# Patient Record
Sex: Male | Born: 1960 | Race: White | Hispanic: No | Smoking: Current every day smoker
Health system: Southern US, Community
[De-identification: ages and names within clinical notes are randomized; demographics above are authoritative.]

## PROBLEM LIST (undated history)

## (undated) DIAGNOSIS — E785 Hyperlipidemia, unspecified: Secondary | ICD-10-CM

## (undated) DIAGNOSIS — I4819 Other persistent atrial fibrillation: Secondary | ICD-10-CM

## (undated) DIAGNOSIS — F1011 Alcohol abuse, in remission: Secondary | ICD-10-CM

## (undated) DIAGNOSIS — I428 Other cardiomyopathies: Secondary | ICD-10-CM

## (undated) DIAGNOSIS — Z950 Presence of cardiac pacemaker: Secondary | ICD-10-CM

## (undated) DIAGNOSIS — I509 Heart failure, unspecified: Secondary | ICD-10-CM

## (undated) DIAGNOSIS — R0683 Snoring: Secondary | ICD-10-CM

## (undated) HISTORY — DX: Presence of cardiac pacemaker: Z95.0

## (undated) HISTORY — DX: Heart failure, unspecified: I50.9

## (undated) HISTORY — DX: Hyperlipidemia, unspecified: E78.5

## (undated) HISTORY — DX: Other cardiomyopathies: I42.8

## (undated) HISTORY — DX: Alcohol abuse, in remission: F10.11

## (undated) HISTORY — DX: Other persistent atrial fibrillation: I48.19

## (undated) HISTORY — DX: Snoring: R06.83

## (undated) HISTORY — PX: APPENDECTOMY: SHX54

---

## 1998-01-09 ENCOUNTER — Inpatient Hospital Stay (HOSPITAL_COMMUNITY): Admission: EM | Admit: 1998-01-09 | Discharge: 1998-01-11 | Payer: Self-pay | Admitting: Emergency Medicine

## 1998-02-19 ENCOUNTER — Inpatient Hospital Stay (HOSPITAL_COMMUNITY): Admission: EM | Admit: 1998-02-19 | Discharge: 1998-02-21 | Payer: Self-pay | Admitting: Emergency Medicine

## 1998-02-19 ENCOUNTER — Encounter: Payer: Self-pay | Admitting: Emergency Medicine

## 2000-08-13 ENCOUNTER — Emergency Department (HOSPITAL_COMMUNITY): Admission: EM | Admit: 2000-08-13 | Discharge: 2000-08-14 | Payer: Self-pay | Admitting: Emergency Medicine

## 2000-08-14 ENCOUNTER — Encounter: Payer: Self-pay | Admitting: Emergency Medicine

## 2000-08-19 ENCOUNTER — Ambulatory Visit (HOSPITAL_COMMUNITY): Admission: RE | Admit: 2000-08-19 | Discharge: 2000-08-19 | Payer: Self-pay | Admitting: Orthopedic Surgery

## 2000-08-19 ENCOUNTER — Encounter: Payer: Self-pay | Admitting: Orthopedic Surgery

## 2000-08-27 ENCOUNTER — Encounter: Payer: Self-pay | Admitting: Orthopedic Surgery

## 2000-08-27 ENCOUNTER — Encounter: Admission: RE | Admit: 2000-08-27 | Discharge: 2000-08-27 | Payer: Self-pay | Admitting: Orthopedic Surgery

## 2000-09-10 ENCOUNTER — Encounter: Admission: RE | Admit: 2000-09-10 | Discharge: 2000-09-10 | Payer: Self-pay | Admitting: Orthopedic Surgery

## 2000-09-10 ENCOUNTER — Encounter: Payer: Self-pay | Admitting: Orthopedic Surgery

## 2009-11-28 HISTORY — PX: CARDIAC CATHETERIZATION: SHX172

## 2009-12-07 ENCOUNTER — Inpatient Hospital Stay (HOSPITAL_COMMUNITY): Admission: EM | Admit: 2009-12-07 | Discharge: 2009-12-10 | Payer: Self-pay | Admitting: Emergency Medicine

## 2009-12-08 ENCOUNTER — Encounter (INDEPENDENT_AMBULATORY_CARE_PROVIDER_SITE_OTHER): Payer: Self-pay | Admitting: Cardiovascular Disease

## 2010-01-24 ENCOUNTER — Inpatient Hospital Stay (HOSPITAL_COMMUNITY): Admission: RE | Admit: 2010-01-24 | Discharge: 2010-01-26 | Payer: Self-pay | Admitting: Cardiovascular Disease

## 2010-03-01 ENCOUNTER — Ambulatory Visit (HOSPITAL_COMMUNITY): Admission: RE | Admit: 2010-03-01 | Discharge: 2010-03-01 | Payer: Self-pay | Admitting: Cardiovascular Disease

## 2010-03-18 ENCOUNTER — Encounter: Payer: Self-pay | Admitting: Internal Medicine

## 2010-04-18 DIAGNOSIS — I509 Heart failure, unspecified: Secondary | ICD-10-CM | POA: Insufficient documentation

## 2010-04-18 DIAGNOSIS — J449 Chronic obstructive pulmonary disease, unspecified: Secondary | ICD-10-CM | POA: Insufficient documentation

## 2010-04-18 DIAGNOSIS — I4891 Unspecified atrial fibrillation: Secondary | ICD-10-CM | POA: Insufficient documentation

## 2010-04-19 ENCOUNTER — Ambulatory Visit: Payer: Self-pay | Admitting: Internal Medicine

## 2010-04-19 ENCOUNTER — Encounter: Payer: Self-pay | Admitting: Internal Medicine

## 2010-04-19 DIAGNOSIS — R0609 Other forms of dyspnea: Secondary | ICD-10-CM | POA: Insufficient documentation

## 2010-04-19 DIAGNOSIS — R0989 Other specified symptoms and signs involving the circulatory and respiratory systems: Secondary | ICD-10-CM

## 2010-04-19 DIAGNOSIS — I426 Alcoholic cardiomyopathy: Secondary | ICD-10-CM | POA: Insufficient documentation

## 2010-04-30 DIAGNOSIS — Z950 Presence of cardiac pacemaker: Secondary | ICD-10-CM

## 2010-04-30 DIAGNOSIS — I4819 Other persistent atrial fibrillation: Secondary | ICD-10-CM

## 2010-04-30 HISTORY — PX: PACEMAKER INSERTION: SHX728

## 2010-04-30 HISTORY — DX: Presence of cardiac pacemaker: Z95.0

## 2010-04-30 HISTORY — DX: Other persistent atrial fibrillation: I48.19

## 2010-06-01 NOTE — Assessment & Plan Note (Signed)
Summary: cardiac eval/a-fib/ablation/mt   Visit Type:  Initial Consult Referring Provider:  Dr Royann Shivers Primary Provider:  [facility]   History of Present Illness: Mike Bean is a pleasant 50 yo WM with a h/o persistent atrial fibrillation, prior ETOH abuse, and nonischemic CM who presents today for EP consultation.  He was iniitally diagnosed with atrial fibrillation 2003 after presenting with presyncope to Urgent Care.  He was taken to [facility] and evaluated by Dr Donnie Aho.  He spontaneously converted to sinus rhythm but returned to afib 4 weeks later.  He was placed on digoxin and coumadin.  He reports maintaining sinus rhythm for several years and then quit following up with Dr Donnie Aho and Patty Sermons.   He reports subsequent afib 1-2 times per year lasting less than 24 hours over the past few weeks.  Since that time, he reports increasing frequency and duraiton of atrial fibrillation.  He reports associated fatigue and decreased exercise tolerance.  He subsequently developed sympatomatic CHF for which he was hospitalized at [facility].  He was found to have EF 20-25% by echo.  Cath revealed normal coronary arteries.   He was placed on pradaxa and cardioverted unsuccessfully 01/25/10.  He was loaded overnight with amiodarone and again unsuccesfully cardioverted the following day.  He was continued on amiodarone for several weeks and returned for cardioversion 03/01/10.  Though cardioversion was initially successful, unfortunately, he had returned to afib by follow-up the next week. Presently, he reports decreased exercise tolerance and fatigue.  He has SOB and stable volume overload.   Previously, he was very healthy.  In July of this year he road his bike 500 miles.  He has subsequently become inactive.  Current Medications (verified): 1)  Amiodarone Hcl 200 Mg Tabs (Amiodarone Hcl) .... Take 2 Tablets By Mouth Twice A Day 2)  Digoxin 0.125 Mg Tabs (Digoxin) .... Take One Tablet  By Mouth Daily 3)  Metoprolol Tartrate 25 Mg Tabs (Metoprolol Tartrate) .... Take 1 1/2  Tablet By Mouth Twice A Day 4)  Furosemide 40 Mg Tabs (Furosemide) .... Take 2 Tablets By Mouth Daily. 5)  Aspirin 81 Mg Tbec (Aspirin) .... Take One Tablet By Mouth Daily 6)  Lisinopril 2.5 Mg Tabs (Lisinopril) .... Take One Tablet By Mouth Daily 7)  Potassium Chloride Crys Cr 20 Meq Cr-Tabs (Potassium Chloride Crys Cr) .... Take One Tablet By Mouth Daily 8)  Pradaxa 150 Mg Caps (Dabigatran Etexilate Mesylate) .... Two Times A Day 9)  Multivitamins   Tabs (Multiple Vitamin) .... Once Daily 10)  Thiamine Hcl 100 Mg Tabs (Thiamine Hcl) .... Once Daily 11)  Folic Acid 1 Mg Tabs (Folic Acid) .... 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  Remarkable in that his father died at 9 of an MI.  He   has 2 brothers but apparently that do not have coronary disease.      Social History:  He is separated and lives in [place].  He works in Airline pilot.  He smokes a pack a day and drinks at least 3 beers a day.  He has 2 children.  No  grandchildren.   Review of Systems       snores  and suspects that he has sleep apnea  Vital Signs:  Patient profile:   50 year old male Height:      70 inches Weight:      217 pounds BMI:     31.25 Pulse rate:   143 / minute BP sitting:   92 / 72  (left arm)  Vitals Entered By: Laurance Flatten CMA (April 19, 2010 9:03 AM)  Physical Exam  General:  Well developed, well nourished, in no acute distress. Head:  normocephalic and atraumatic Eyes:  PERRLA/EOM intact; conjunctiva and lids normal. Mouth:  Teeth, gums and palate  normal. Oral mucosa normal. Neck:  supple, JVP 9cm Lungs:  Clear bilaterally to auscultation and percussion. Heart:  irregular tachycardic rhythm, no m/r/g Abdomen:  Bowel sounds positive; abdomen soft and non-tender without masses, organomegaly, or hernias noted. No hepatosplenomegaly. Msk:  Back normal, normal gait. Muscle strength and tone normal. Extremities:  1+ BLE edema Neurologic:  Alert and oriented x 3. Skin:  Intact without lesions or rashes. Cervical Nodes:  no significant adenopathy Psych:  Normal affect.   Echocardiogram  Procedure date:  12/08/2009  Findings:       Study Conclusions    - Left ventricle: The cavity size was mildly dilated. Systolic     function was severely reduced. The estimated ejection fraction was     in the range of 20% to 25%. Diffuse hypokinesis.   - Mitral valve: Mild regurgitation.   - Left atrium: The atrium was mildly dilated.   Transthoracic echocardiography. M-mode, complete 2D, spectral   Doppler, and color Doppler. Height: Height: 177cm. Height: 69.7in.   Weight: Weight: 98kg. Weight: 215.6lb. Body mass index: BMI:   31.3kg/m^2. Body surface area: BSA: 2.25m^2. Blood pressure: 97/60.   Patient status: Inpatient. Location: ICU/CCU  Left atrium   AP dim           50 mm     ------   AP dim index   2.33 cm/m^2 <2.2    --------------------------------------------------------------------   Prepared and Electronically Authenticated by    Ritta Slot, MD   [id number]   Cardiac Cath  Procedure date:  12/10/2009  Findings:      IMPRESSION:   1. Angiographically normal coronary arteries.   2. Anomalous left circumflex artery.      PLAN:  I discussed the findings with Mike. Mike Bean and Dr. Royann Shivers who were   in agreement that at this point we should approach medical management of   his new-onset heart failure.  This may be related to alcohol use or his   recent atrial fibrillation and improved rate control as well as  alcohol   reduction and medical therapy may help reverse or improve his   cardiomyopathy.               Italy Hilty, MD      Echocardiogram  Procedure date:  04/06/2010  Findings:      Interpretation:  A complete two-dimensional transthoracic echo was performed. AF with controlled VR. 4 chamber enlargement. There is global thinning  of the left ventricular walls. The left ventricle  is moderately dilated. Left ventricular  systolic function  is severely reduced. EF = <25%. There is severe global hypokinesis of the left ventricle. The right  ventricle is mildly dilated. The LA is moderate dilated The right atrium  is mild to moderatly dilated. There is mild to moderate  mitial regugitation. Right ventricular systolic pressure is normal  There is no pericardial effusion.  Susa Griffins, MD      EKG  Procedure date:  04/19/2010  Findings:      afib, V rate 140 bpm, nonspecific ST/T changes  Impression & Recommendations:  Problem # 1:  ATRIAL FIBRILLATION (ICD-427.31) Mike Bean presents today for further evaluation of symptomatic persistent atrial fibrillation.  His afib is refractory to amiodarone therapy.  Recent echo reveals LA size 50mm.  He also has very poor rate control with tachycardia mediated cardiomypathy.  The underlying cause for his afib is likely heavy ETOH and possibly sleep apnea.  His noncompliance with ETOH cessation and medical therapy does not bode well for his longterm prognosis.  Today, he reports that he has not been taking pradaxa or metoprolol as prescribed by Dr Royann Shivers.  I am concerned that his afib is now quite advanced and may not be amenable to catheter ablation.  I anticipate success rates for ablation would be 50-60% and would likely require more than one procedure. Therapeutic strategies for afib including medicine and ablation were discussed in detail with the patient today. Risk, benefits, and alternatives to EP study and  radiofrequency ablation for afib were also discussed in detail today. These risks include but are not limited to stroke, bleeding, vascular damage, tamponade, perforation, damage to the esophagus, lungs, and other structures, pulmonary vein stenosis, worsening renal function, and death. The patient understands these risk and wishes to further contemplate his options. The importance of compliance with rate control medicines was stressed with the patient today.  He will contact my office if he chooses to proceed with ablation.  Problem # 2:  CHF (ICD-428.0) The patient has a tachycardia mediated cardiomyopathy, likely compounded by heavy ETOH use. He appears euvolemic on exam today.  The importance of rate control was stressed as above.  He is instructed to restart metoprolol as ordered by Dr Royann Shivers.  Complete ETOH avoidance is also advised.  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  This can be performed by Dr Tresa Endo at [facility].  Patient Instructions: 1)  Your physician recommends that you schedule a follow-up appointment in: 3 months with Dr Johney Frame 2)  Your physician recommends that you continue on your current medications as directed. Please refer to the Current Medication list given to you today.

## 2010-06-01 NOTE — Letter (Signed)
Summary: [facility]  [facility]   Imported By: Marylou Mccoy 05/11/2010 09:30:38  _____________________________________________________________________  External Attachment:    Type:   Image     Comment:   External Document

## 2010-06-21 ENCOUNTER — Inpatient Hospital Stay (HOSPITAL_COMMUNITY)
Admission: AD | Admit: 2010-06-21 | Discharge: 2010-07-01 | DRG: 242 | Disposition: A | Discharge status: Home / Self Care | Payer: 59 | Source: Ambulatory Visit | Attending: Cardiovascular Disease | Admitting: Cardiovascular Disease

## 2010-06-21 ENCOUNTER — Inpatient Hospital Stay (HOSPITAL_COMMUNITY): Payer: 59

## 2010-06-21 DIAGNOSIS — I4891 Unspecified atrial fibrillation: Secondary | ICD-10-CM | POA: Diagnosis present

## 2010-06-21 DIAGNOSIS — K7200 Acute and subacute hepatic failure without coma: Secondary | ICD-10-CM | POA: Diagnosis not present

## 2010-06-21 DIAGNOSIS — T462X5A Adverse effect of other antidysrhythmic drugs, initial encounter: Secondary | ICD-10-CM | POA: Diagnosis present

## 2010-06-21 DIAGNOSIS — D696 Thrombocytopenia, unspecified: Secondary | ICD-10-CM | POA: Diagnosis not present

## 2010-06-21 DIAGNOSIS — I428 Other cardiomyopathies: Secondary | ICD-10-CM | POA: Diagnosis present

## 2010-06-21 DIAGNOSIS — R7989 Other specified abnormal findings of blood chemistry: Secondary | ICD-10-CM

## 2010-06-21 DIAGNOSIS — Z7982 Long term (current) use of aspirin: Secondary | ICD-10-CM

## 2010-06-21 DIAGNOSIS — I5022 Chronic systolic (congestive) heart failure: Secondary | ICD-10-CM | POA: Diagnosis present

## 2010-06-21 DIAGNOSIS — Z9119 Patient's noncompliance with other medical treatment and regimen: Secondary | ICD-10-CM

## 2010-06-21 DIAGNOSIS — Z91199 Patient's noncompliance with other medical treatment and regimen due to unspecified reason: Secondary | ICD-10-CM

## 2010-06-21 DIAGNOSIS — I509 Heart failure, unspecified: Principal | ICD-10-CM | POA: Diagnosis present

## 2010-06-21 DIAGNOSIS — I517 Cardiomegaly: Secondary | ICD-10-CM | POA: Diagnosis present

## 2010-06-21 DIAGNOSIS — E876 Hypokalemia: Secondary | ICD-10-CM | POA: Diagnosis not present

## 2010-06-21 DIAGNOSIS — N179 Acute kidney failure, unspecified: Secondary | ICD-10-CM | POA: Diagnosis not present

## 2010-06-21 LAB — DIFFERENTIAL
Lymphocytes Relative: 21 % (ref 12–46)
Monocytes Absolute: 1.5 10*3/uL — ABNORMAL HIGH (ref 0.1–1.0)
Neutro Abs: 8 10*3/uL — ABNORMAL HIGH (ref 1.7–7.7)
Neutrophils Relative %: 66 % (ref 43–77)

## 2010-06-21 LAB — PROTIME-INR
INR: 1.83 — ABNORMAL HIGH (ref 0.00–1.49)
Prothrombin Time: 21.3 seconds — ABNORMAL HIGH (ref 11.6–15.2)

## 2010-06-21 LAB — DIGOXIN LEVEL: Digoxin Level: 0.2 ng/mL — ABNORMAL LOW (ref 0.8–2.0)

## 2010-06-21 LAB — CBC
HCT: 39.7 % (ref 39.0–52.0)
MCH: 30.9 pg (ref 26.0–34.0)
Platelets: 236 10*3/uL (ref 150–400)
RDW: 12.6 % (ref 11.5–15.5)

## 2010-06-21 LAB — COMPREHENSIVE METABOLIC PANEL
ALT: 53 U/L (ref 0–53)
AST: 54 U/L — ABNORMAL HIGH (ref 0–37)
Alkaline Phosphatase: 58 U/L (ref 39–117)
BUN: 17 mg/dL (ref 6–23)
Creatinine, Ser: 1.17 mg/dL (ref 0.4–1.5)
GFR calc Af Amer: >60 mL/min (ref >60)
Potassium: 3.5 mEq/L (ref 3.5–5.1)
Sodium: 131 mEq/L — ABNORMAL LOW (ref 135–145)
Total Bilirubin: 2.6 mg/dL — ABNORMAL HIGH (ref 0.3–1.2)
Total Protein: 5.5 g/dL — ABNORMAL LOW (ref 6.0–8.3)

## 2010-06-21 LAB — CARDIAC PANEL(CRET KIN+CKTOT+MB+TROPI): CK, MB: 3.3 ng/mL (ref 0.3–4.0)

## 2010-06-21 LAB — APTT: aPTT: 42 seconds — ABNORMAL HIGH (ref 24–37)

## 2010-06-22 ENCOUNTER — Inpatient Hospital Stay (HOSPITAL_COMMUNITY): Payer: 59

## 2010-06-22 LAB — COMPREHENSIVE METABOLIC PANEL
ALT: 3621 U/L — ABNORMAL HIGH (ref 0–53)
Albumin: 2.6 g/dL — ABNORMAL LOW (ref 3.5–5.2)
Albumin: 2.3 g/dL — ABNORMAL LOW (ref 3.5–5.2)
BUN: 27 mg/dL — ABNORMAL HIGH (ref 6–23)
CO2: 11 mEq/L — ABNORMAL LOW (ref 19–32)
Calcium: 8 mg/dL — ABNORMAL LOW (ref 8.4–10.5)
Chloride: 93 mEq/L — ABNORMAL LOW (ref 96–112)
Creatinine, Ser: 2.52 mg/dL — ABNORMAL HIGH (ref 0.4–1.5)
GFR calc Af Amer: 32 mL/min — ABNORMAL LOW (ref >60)
GFR calc non Af Amer: 26 mL/min — ABNORMAL LOW (ref >60)
Glucose, Bld: 95 mg/dL (ref 70–99)
Glucose, Bld: 165 mg/dL — ABNORMAL HIGH (ref 70–99)
Total Bilirubin: 4.6 mg/dL — ABNORMAL HIGH (ref 0.3–1.2)

## 2010-06-22 LAB — CBC
HCT: 48.3 % (ref 39.0–52.0)
HCT: 44.6 % (ref 39.0–52.0)
Hemoglobin: 16 g/dL (ref 13.0–17.0)
Hemoglobin: 14.7 g/dL (ref 13.0–17.0)
MCH: 31.1 pg (ref 26.0–34.0)
MCH: 30.5 pg (ref 26.0–34.0)
MCHC: 33.1 g/dL (ref 30.0–36.0)
MCHC: 33 g/dL (ref 30.0–36.0)
MCV: 94 fL (ref 78.0–100.0)
MCV: 92.5 fL (ref 78.0–100.0)
Platelets: 288 10*3/uL (ref 150–400)
Platelets: 271 10*3/uL (ref 150–400)
RBC: 5.14 MIL/uL (ref 4.22–5.81)
RBC: 4.82 MIL/uL (ref 4.22–5.81)
RDW: 12.9 % (ref 11.5–15.5)
WBC: 23.7 10*3/uL — ABNORMAL HIGH (ref 4.0–10.5)

## 2010-06-22 LAB — BASIC METABOLIC PANEL
Creatinine, Ser: 2.65 mg/dL — ABNORMAL HIGH (ref 0.4–1.5)
GFR calc non Af Amer: 25 mL/min — ABNORMAL LOW (ref >60)
Glucose, Bld: 59 mg/dL — ABNORMAL LOW (ref 70–99)
Potassium: 5.3 mEq/L — ABNORMAL HIGH (ref 3.5–5.1)

## 2010-06-22 LAB — DIFFERENTIAL
Basophils Relative: 0 % (ref 0–1)
Monocytes Relative: 7 % (ref 3–12)

## 2010-06-22 LAB — LIPASE, BLOOD: Lipase: 93 U/L — ABNORMAL HIGH (ref 11–59)

## 2010-06-23 ENCOUNTER — Inpatient Hospital Stay (HOSPITAL_COMMUNITY): Payer: 59

## 2010-06-23 LAB — CBC
HCT: 39.5 % (ref 39.0–52.0)
Hemoglobin: 13.5 g/dL (ref 13.0–17.0)
MCHC: 34.2 g/dL (ref 30.0–36.0)
Platelets: 190 10*3/uL (ref 150–400)
RBC: 4.4 MIL/uL (ref 4.22–5.81)
RDW: 12.6 % (ref 11.5–15.5)
WBC: 23.8 10*3/uL — ABNORMAL HIGH (ref 4.0–10.5)

## 2010-06-23 LAB — COMPREHENSIVE METABOLIC PANEL
AST: 15024 U/L — ABNORMAL HIGH (ref 0–37)
Albumin: 2.3 g/dL — ABNORMAL LOW (ref 3.5–5.2)
Calcium: 7.8 mg/dL — ABNORMAL LOW (ref 8.4–10.5)
Creatinine, Ser: 1.99 mg/dL — ABNORMAL HIGH (ref 0.4–1.5)
GFR calc Af Amer: 43 mL/min — ABNORMAL LOW (ref >60)
GFR calc non Af Amer: 35 mL/min — ABNORMAL LOW (ref >60)

## 2010-06-23 LAB — PROTIME-INR: INR: 7.65 (ref 0.00–1.49)

## 2010-06-23 LAB — HEPARIN LEVEL (UNFRACTIONATED): Heparin Unfractionated: <0.1 IU/mL — ABNORMAL LOW (ref 0.30–0.70)

## 2010-06-24 ENCOUNTER — Inpatient Hospital Stay (HOSPITAL_COMMUNITY): Payer: 59

## 2010-06-24 LAB — COMPREHENSIVE METABOLIC PANEL
ALT: 4804 U/L — ABNORMAL HIGH (ref 0–53)
Alkaline Phosphatase: 148 U/L — ABNORMAL HIGH (ref 39–117)
BUN: 28 mg/dL — ABNORMAL HIGH (ref 6–23)
CO2: 28 mEq/L (ref 19–32)
Chloride: 101 mEq/L (ref 96–112)
GFR calc non Af Amer: 57 mL/min — ABNORMAL LOW (ref >60)
Glucose, Bld: 72 mg/dL (ref 70–99)
Potassium: 4 mEq/L (ref 3.5–5.1)
Sodium: 136 mEq/L (ref 135–145)
Total Bilirubin: 3.2 mg/dL — ABNORMAL HIGH (ref 0.3–1.2)
Total Protein: 4.6 g/dL — ABNORMAL LOW (ref 6.0–8.3)

## 2010-06-24 LAB — EXPECTORATED SPUTUM ASSESSMENT W GRAM STAIN, RFLX TO RESP C

## 2010-06-24 LAB — BRAIN NATRIURETIC PEPTIDE: Pro B Natriuretic peptide (BNP): 780 pg/mL — ABNORMAL HIGH (ref 0.0–100.0)

## 2010-06-24 LAB — RAPID URINE DRUG SCREEN, HOSP PERFORMED
Benzodiazepines: NOT DETECTED
Cocaine: NOT DETECTED
Opiates: NOT DETECTED

## 2010-06-24 LAB — PROTIME-INR: Prothrombin Time: 35.4 seconds — ABNORMAL HIGH (ref 11.6–15.2)

## 2010-06-25 LAB — COMPREHENSIVE METABOLIC PANEL
AST: 1168 U/L — ABNORMAL HIGH (ref 0–37)
Albumin: 2.1 g/dL — ABNORMAL LOW (ref 3.5–5.2)
Alkaline Phosphatase: 160 U/L — ABNORMAL HIGH (ref 39–117)
BUN: 22 mg/dL (ref 6–23)
Chloride: 99 mEq/L (ref 96–112)
Creatinine, Ser: 1.07 mg/dL (ref 0.4–1.5)
GFR calc Af Amer: >60 mL/min (ref >60)
Potassium: 4.1 mEq/L (ref 3.5–5.1)
Total Bilirubin: 3.4 mg/dL — ABNORMAL HIGH (ref 0.3–1.2)
Total Protein: 4.6 g/dL — ABNORMAL LOW (ref 6.0–8.3)

## 2010-06-25 LAB — HEPATITIS PANEL, ACUTE
HCV Ab: NEGATIVE
Hep A IgM: NEGATIVE

## 2010-06-25 LAB — CBC
HCT: 43.1 % (ref 39.0–52.0)
Hemoglobin: 14.5 g/dL (ref 13.0–17.0)
MCH: 30.1 pg (ref 26.0–34.0)
MCHC: 33.6 g/dL (ref 30.0–36.0)
MCV: 89.6 fL (ref 78.0–100.0)
RDW: 12.7 % (ref 11.5–15.5)

## 2010-06-25 LAB — PROTIME-INR: INR: 2.36 — ABNORMAL HIGH (ref 0.00–1.49)

## 2010-06-26 ENCOUNTER — Inpatient Hospital Stay (HOSPITAL_COMMUNITY): Payer: 59

## 2010-06-26 DIAGNOSIS — I428 Other cardiomyopathies: Secondary | ICD-10-CM

## 2010-06-26 DIAGNOSIS — I4891 Unspecified atrial fibrillation: Secondary | ICD-10-CM

## 2010-06-26 LAB — COMPREHENSIVE METABOLIC PANEL
ALT: 1882 U/L — ABNORMAL HIGH (ref 0–53)
AST: 399 U/L — ABNORMAL HIGH (ref 0–37)
Alkaline Phosphatase: 137 U/L — ABNORMAL HIGH (ref 39–117)
CO2: 36 mEq/L — ABNORMAL HIGH (ref 19–32)
Chloride: 96 mEq/L (ref 96–112)
GFR calc Af Amer: >60 mL/min (ref >60)
GFR calc non Af Amer: >60 mL/min (ref >60)
Glucose, Bld: 72 mg/dL (ref 70–99)
Sodium: 139 mEq/L (ref 135–145)
Total Bilirubin: 3.7 mg/dL — ABNORMAL HIGH (ref 0.3–1.2)

## 2010-06-26 LAB — BRAIN NATRIURETIC PEPTIDE: Pro B Natriuretic peptide (BNP): 730 pg/mL — ABNORMAL HIGH (ref 0.0–100.0)

## 2010-06-26 LAB — CBC
HCT: 46.4 % (ref 39.0–52.0)
MCH: 31 pg (ref 26.0–34.0)
MCV: 91.2 fL (ref 78.0–100.0)
Platelets: 161 10*3/uL (ref 150–400)
RBC: 5.09 MIL/uL (ref 4.22–5.81)
WBC: 17.5 10*3/uL — ABNORMAL HIGH (ref 4.0–10.5)

## 2010-06-26 LAB — GLUCOSE, CAPILLARY: Glucose-Capillary: 147 mg/dL — ABNORMAL HIGH (ref 70–99)

## 2010-06-27 ENCOUNTER — Inpatient Hospital Stay (HOSPITAL_COMMUNITY): Payer: 59

## 2010-06-27 DIAGNOSIS — I509 Heart failure, unspecified: Secondary | ICD-10-CM

## 2010-06-27 LAB — COMPREHENSIVE METABOLIC PANEL
AST: 215 U/L — ABNORMAL HIGH (ref 0–37)
Albumin: 2.2 g/dL — ABNORMAL LOW (ref 3.5–5.2)
Alkaline Phosphatase: 113 U/L (ref 39–117)
BUN: 15 mg/dL (ref 6–23)
CO2: 35 mEq/L — ABNORMAL HIGH (ref 19–32)
Chloride: 97 mEq/L (ref 96–112)
Creatinine, Ser: 1.2 mg/dL (ref 0.4–1.5)
GFR calc Af Amer: >60 mL/min (ref >60)
GFR calc non Af Amer: >60 mL/min (ref >60)
Potassium: 4.5 mEq/L (ref 3.5–5.1)
Total Bilirubin: 2.7 mg/dL — ABNORMAL HIGH (ref 0.3–1.2)

## 2010-06-27 LAB — CBC
HCT: 47.5 % (ref 39.0–52.0)
Hemoglobin: 16.1 g/dL (ref 13.0–17.0)
MCH: 30.7 pg (ref 26.0–34.0)
MCHC: 33.9 g/dL (ref 30.0–36.0)
RBC: 5.24 MIL/uL (ref 4.22–5.81)

## 2010-06-27 LAB — DIFFERENTIAL
Basophils Relative: 2 % — ABNORMAL HIGH (ref 0–1)
Eosinophils Relative: 4 % (ref 0–5)
Lymphs Abs: 3.3 10*3/uL (ref 0.7–4.0)
Monocytes Absolute: 2.3 10*3/uL — ABNORMAL HIGH (ref 0.1–1.0)
Monocytes Relative: 15 % — ABNORMAL HIGH (ref 3–12)

## 2010-06-28 LAB — COMPREHENSIVE METABOLIC PANEL
AST: 140 U/L — ABNORMAL HIGH (ref 0–37)
BUN: 17 mg/dL (ref 6–23)
CO2: 32 mEq/L (ref 19–32)
Calcium: 8 mg/dL — ABNORMAL LOW (ref 8.4–10.5)
Chloride: 97 mEq/L (ref 96–112)
Creatinine, Ser: 1.05 mg/dL (ref 0.4–1.5)
GFR calc Af Amer: >60 mL/min (ref >60)
GFR calc non Af Amer: >60 mL/min (ref >60)
Glucose, Bld: 126 mg/dL — ABNORMAL HIGH (ref 70–99)
Total Bilirubin: 1.9 mg/dL — ABNORMAL HIGH (ref 0.3–1.2)

## 2010-06-28 LAB — CULTURE, BLOOD (ROUTINE X 2): Culture  Setup Time: 201202231533

## 2010-06-28 LAB — BRAIN NATRIURETIC PEPTIDE: Pro B Natriuretic peptide (BNP): 556 pg/mL — ABNORMAL HIGH (ref 0.0–100.0)

## 2010-06-29 LAB — COMPREHENSIVE METABOLIC PANEL
AST: 120 U/L — ABNORMAL HIGH (ref 0–37)
Albumin: 2.4 g/dL — ABNORMAL LOW (ref 3.5–5.2)
BUN: 15 mg/dL (ref 6–23)
Calcium: 8.1 mg/dL — ABNORMAL LOW (ref 8.4–10.5)
Chloride: 97 mEq/L (ref 96–112)
Creatinine, Ser: 1.17 mg/dL (ref 0.4–1.5)
GFR calc Af Amer: >60 mL/min (ref >60)
Total Bilirubin: 1.6 mg/dL — ABNORMAL HIGH (ref 0.3–1.2)
Total Protein: 5.5 g/dL — ABNORMAL LOW (ref 6.0–8.3)

## 2010-06-29 LAB — PROTIME-INR
INR: 1.15 (ref 0.00–1.49)
Prothrombin Time: 14.9 seconds (ref 11.6–15.2)

## 2010-06-30 LAB — COMPREHENSIVE METABOLIC PANEL
ALT: 506 U/L — ABNORMAL HIGH (ref 0–53)
AST: 91 U/L — ABNORMAL HIGH (ref 0–37)
Alkaline Phosphatase: 138 U/L — ABNORMAL HIGH (ref 39–117)
CO2: 31 mEq/L (ref 19–32)
Calcium: 8.3 mg/dL — ABNORMAL LOW (ref 8.4–10.5)
GFR calc Af Amer: >60 mL/min (ref >60)
Potassium: 4.6 mEq/L (ref 3.5–5.1)
Sodium: 137 mEq/L (ref 135–145)
Total Protein: 6 g/dL (ref 6.0–8.3)

## 2010-06-30 LAB — DIFFERENTIAL
Basophils Relative: 0 % (ref 0–1)
Lymphocytes Relative: 34 % (ref 12–46)
Monocytes Absolute: 1.4 10*3/uL — ABNORMAL HIGH (ref 0.1–1.0)
Monocytes Relative: 12 % (ref 3–12)
Neutro Abs: 5.7 10*3/uL (ref 1.7–7.7)
Neutrophils Relative %: 50 % (ref 43–77)

## 2010-06-30 LAB — CBC
HCT: 50.7 % (ref 39.0–52.0)
Hemoglobin: 17 g/dL (ref 13.0–17.0)
MCH: 30.7 pg (ref 26.0–34.0)
RBC: 5.54 MIL/uL (ref 4.22–5.81)

## 2010-06-30 LAB — PROTIME-INR
INR: 1.13 (ref 0.00–1.49)
Prothrombin Time: 14.7 seconds (ref 11.6–15.2)

## 2010-06-30 NOTE — H&P (Signed)
NAMEALEGANDRO, MACNAUGHTON                  ACCOUNT NO.:  [id number]  MEDICAL RECORD NO.:  [id number]          PATIENT TYPE:  EMS  LOCATION:  MAJO                         FACILITY:  [facility]  PHYSICIAN:  Thurmon Fair, MD     DATE OF BIRTH:  09/07/1960  DATE OF ADMISSION:  12/07/2009 DATE OF DISCHARGE:                             HISTORY & PHYSICAL   CHIEF COMPLAINT:  Shortness of breath.  HISTORY OF PRESENT ILLNESS:  [name] is a 50 year old male who has had a past history of atrial fibrillation.  In around 2003, he had a couple spells of atrial fibrillation, each one lasted for a couple days.  He was not cardioverted.  He was treated with Lanoxin because he had a relatively low blood pressure at that time.  He was treated by Dr. Patty Sermons then.  He says he had an extensive exercise test as an outpatient around that time that was negative.  About 3 years ago he stopped seeing Dr. Patty Sermons and his medications ran out.  He had been on no medicines since.  For the last 1-2 weeks, he has been having increasing lower extremity edema and orthopnea.  He went to Dr. Merita Norton today and was found to be in rapid atrial fibrillation.  He admits he was recently at [facility] "partying."  He has had some chest pressure but it is nonexertional and it is constant.  He was sent over from Urgent Care today to [facility] ER.  On arrival to the emergency room he is in atrial fibrillation with a rate of about 150.  PAST MEDICAL HISTORY:  Remarkable for dyslipidemia.  He has been on medication for this in the past.  He had a remote appendectomy.  He denies any history of diabetes or hypertension.  He had no other major surgeries.  He is currently on no medications.  ALLERGIES:  He has no known drug allergies.  SOCIAL HISTORY:  He is separated.  He works in Airline pilot.  He smokes a pack a day and drinks at least 3 beers a day.  He has 2 children.  No grandchildren.  FAMILY HISTORY:  Remarkable in that his  father died at 19 of an MI.  He has 2 brothers but apparently that do not have coronary disease.  REVIEW OF SYSTEMS:  Essentially unremarkable except for as noted above. He denies any GI bleeding or melena.  He has not had any renal problems. He says over the past 3 years he has had intermittent episodes of palpitations and tachycardia, he self- treats this by jumping into a cold shower.  PHYSICAL EXAMINATION:  Blood pressure 126/76, heart rate 154, respirations 16. GENERAL:  He is a well-developed, well-nourished male in no acute distress. HEENT: Normocephalic, atraumatic.  Extraocular muscles are intact. Sclerae anicteric.  Lids and conjunctivae within normal limits.  NECK: Without bruit.  Thyroid is not enlarged. CHEST:  Reveals expiratory wheezing bilaterally. CARDIAC:  Reveals irregularly irregular rhythm with rapid ventricular response.  No obvious murmur. ABDOMEN:  Nontender, nondistended. EXTREMITIES:  Reveal 1+ pretibial edema bilaterally with intact distal pulses. NEURO:  Exam is  grossly intact.  He is awake, alert, oriented and cooperative.  He moves all extremities without obvious deficit.  SKIN: Cool and dry.  Labs are pending.  EKG shows atrial fibrillation with narrow complex, no obvious ischemic changes.  IMPRESSION: 1. Rapid atrial fibrillation. 2. Congestive heart failure secondary to number 1 with a history of     orthopnea and lower extremity edema. 3. Chronic obstructive pulmonary disease by history. 4. Past history of paroxysmal atrial fibrillation with noncompliance     the last 3 years with medications. 5. Past history of dyslipidemia, noncompliant. 6. Suspected sleep apnea, this is per the patient's history, he has     been instructed to get a sleep study but never has followed through     with this.  PLAN:  The patient will be admitted to telemetry.  He will be started on diltiazem for rate control.  For now we will for beta-blocker with his active  wheezing.  He will be diuresed.  We will obtain initial labs and further evaluation will be determined in the next 24 hours.  He may need diagnostic catheterization before we consider coumadinizing him.     Abelino Derrick, P.A.   ______________________________ Thurmon Fair, MD    LKK/MEDQ  D:  12/07/2009  T:  12/07/2009  Job:  [id number]  cc:   Tamera Punt, PA  Electronically Signed by Corine Shelter P.A. on 06/23/2010 09:08:01 AM Electronically Signed by Thurmon Fair M.D. on 06/30/2010 12:49:40 PM

## 2010-06-30 NOTE — H&P (Signed)
NAMELEW, PROUT NO.:  [id number]  MEDICAL RECORD NO.:  [id number]           PATIENT TYPE:  I  LOCATION:  2907                         FACILITY:  [facility]  PHYSICIAN:  Thurmon Fair, MD     DATE OF BIRTH:  [birth year]  DATE OF ADMISSION:  06/21/2010 DATE OF DISCHARGE:                             HISTORY & PHYSICAL   Mike Bean is being admitted to [facility] for acute decompensation of congestive heart failure and atrial fibrillation with rapid ventricular response.  Mike Bean is a 50 year old man with severe nonischemic dilated cardiomyopathy and atrial fibrillation that has been refractory to medical therapy.  His most recent left ventricle ejection fraction was estimated to be less than 25% by echocardiography performed on December 8.  There was evidence of global left ventricular hypokinesis.  He has angiographically normal coronary arteries by cardiac catheterization performed in August 2011.  He does have anomalous left circumflex coronary artery without any obstruction.  He has showed spotty compliance with medical therapy over the last several months.  An attempt at cardioversion was last performed in November and although he returned to a sinus rhythm after 2 shocks, he immediately reverted to atrial fibrillation.  He has been evaluated by Dr. Johney Frame for possible radiofrequency ablation of his atrial fibrillation but decision has not yet been made to go ahead with this procedure.  Mike Bean used to be a fairly heavy alcohol user and smoke cigarettes but he has actually been completely abstinent from cigarettes and alcohol since April 30, 2010.  He has actually been feeling extremely well until about 2 weeks ago.  In fact he was feeling so well that he stopped many of his medications including the amiodarone, metoprolol, lisinopril, although he has continued taking Pradaxa, furosemide and digoxin.  About 2 weeks ago he began feeling unwell,  was coughing copious amounts of clear or slightly yellowish sputum.  A few days ago he was evaluated in an urgent care center at [facility] and was diagnosed with "bronchitis."  He was prescribed azithromycin.  He took the first day loading dose and a second dose and then began feeling extremely rapid palpitations and generally feeling unwell.  He presents today to the office with clinical findings of severely decompensated heart failure and severe tachycardia.  He is quite hypotensive with a blood pressure of 80/66 and he is slightly dizzy although he has not had any syncope.  He has rapid palpitations, is very short of breath at rest and is coughing sometimes with hemoptysis.  He has not had any problems with edema and has not been any significant increase in his weight.  In fact he weighs 4 pounds less than at his previous appointment.  He denies focal neurological deficits or intermittent claudication or frank bleeding problems.  He has alternating diarrhea and constipation and persistent nausea and vomiting.  He does not have frank abdominal pain.  He describes his hands that as being unusually red.  CURRENT MEDICATIONS: 1. Furosemide 80 mg every morning. 2. Pradaxa 150 mg which he is taking only once daily instead of twice  daily. 3. Aspirin 81 mg daily. 4. Digoxin 0.125 mg daily. 5. Occasional multivitamin.  ALLERGIES:  He has no known drug allergies.  He had some nausea and vomiting of bilious material about a month ago which he blamed on amiodarone and subsequently stopped the medication.  SOCIAL HISTORY:  Mike Bean is married but separated from his wife.  He is in good relationship with her and actually receives his medical insurance through her.  He does not smoke or drink alcohol anymore since January 1.  He denies using any recreational drugs in the recent past.  He is a businessman.  He used to exercise avidly and ride his bike for tenths of miles every weekend  but he has not been unable to exercise recently.  PAST MEDICAL HISTORY:  Significant for heart failure, atrial fibrillation and a remote appendectomy.  FAMILY HISTORY:  Significant for a heart attack in his father at age 31 and in his grandfather at age 74.  He has healthy kids, ages 96 and 31.  PHYSICAL EXAMINATION:  GENERAL:  He looks ashen and is very lethargic although he is fully alert and oriented. VITAL SIGNS:  His blood pressure is 80/66, heart rate is 167 and irregular, respiratory rate is 25-30, he weighs 205 pounds and is 5 feet 10 inches tall. NECK:  Jugular venous pulsations are elevated approximately 4-5 cm above the sternal angle.  There is prompt hepatojugular reflux.  I did not see goiter or lymphadenopathy.  There is no carotid bruit and the carotid pulses are low volume but brisk. LUNGS:  Clear to auscultation bilaterally without any audible wheezes, rales, dullness to percussion, abnormal fremitus, egophony or other signs of consolidation. CARDIOVASCULAR:  Weak, effaced and laterally displaced apical impulse. The rhythm is very fast and irregular.  It is therefore impossible to appreciate any gallops or murmurs. ABDOMEN:  Soft, nontender, nondistended without any palpable masses, abnormal pulsatility or flank bruits.  The bowel sounds are normal. EXTREMITIES:  Did not show any clubbing, cyanosis or edema.  The distal pulses are very thready.  His hands and feet, however are warm to touch and his skin is unusually velvety and soft.  The EKG shows atrial fibrillation with rapid ventricular response and borderline criteria for left ventricular hypertrophy, generalized ST- segment depression, most prominent in the inferior leads as well as V5 and V6.  There are no recent laboratory tests available for review.  ASSESSMENT AND PLAN:  Mike Bean presents with acute decompensation of severe chronic systolic heart failure, signs of hypervolemia and an extremely rapid  ventricular response in atrial fibrillation.  He has interrupted all of his rate control medications with exception of low-dose digoxin.  He is only partially compliant with anticoagulation therapy.  It is impossible to use beta-blockers or calcium channel blockers for control of his ventricular response since his blood pressure is so low.  The only real option is hospitalization and administration of intravenous amiodarone as well as treatment with intravenous diuretics as much as his blood pressure allows.  I really think Mike Bean will not get better until he has a radical approach to his arrhythmia such as AV node ablation or atrial fibrillation ablation.  Unfortunately, he has never had a healthy enough period to undergo one of these procedures.  A repeat electrophysiology consult is warranted.     Thurmon Fair, MD     MC/MEDQ  D:  06/21/2010  T:  06/22/2010  Job:  [id number]  cc:   [facility] Fayrene Fearing  Allred, MD  Electronically Signed by Thurmon Fair M.D. on 06/30/2010 12:49:54 PM

## 2010-07-01 ENCOUNTER — Inpatient Hospital Stay (HOSPITAL_COMMUNITY): Payer: 59

## 2010-07-01 LAB — COMPREHENSIVE METABOLIC PANEL
ALT: 321 U/L — ABNORMAL HIGH (ref 0–53)
AST: 60 U/L — ABNORMAL HIGH (ref 0–37)
Alkaline Phosphatase: 133 U/L — ABNORMAL HIGH (ref 39–117)
CO2: 29 mEq/L (ref 19–32)
Chloride: 103 mEq/L (ref 96–112)
GFR calc Af Amer: >60 mL/min (ref >60)
GFR calc non Af Amer: >60 mL/min (ref >60)
Sodium: 137 mEq/L (ref 135–145)
Total Bilirubin: 1.2 mg/dL (ref 0.3–1.2)

## 2010-07-01 LAB — GLUCOSE, CAPILLARY

## 2010-07-06 NOTE — Discharge Summary (Signed)
Mike Bean, Mike Bean                  ACCOUNT NO.:  [id number]  MEDICAL RECORD NO.:  [id number]           PATIENT TYPE:  I  LOCATION:  3707                         FACILITY:  [facility]  PHYSICIAN:  Thurmon Fair, MD     DATE OF BIRTH:  1961-02-15  DATE OF ADMISSION:  06/21/2010 DATE OF DISCHARGE:  07/01/2010                              DISCHARGE SUMMARY   DISCHARGE DIAGNOSES: 1. Acute systolic heart failure in combination with severe nonischemic     dilated cardiomyopathy. 2. Atrial fibrillation with rapid ventricular response.     a.     AV nodal ablation done this admission. 3. Nonischemic cardiomyopathy with ejection fraction 25% by echo in     December 2011.     a.     Biventricular St. Jude pacemaker placed this admission,      June 27, 2010. 4. Acute hepatic failure secondary to amiodarone in combination of     heart failure, resolving. 5. Acute renal failure secondary to amiodarone, resolving. 6. Amiodarone pulmonary toxicity component. 7. Bronchitis, treated with antibiotics. 8. Borderline blood pressure was hypotensive with high doses of     medications. 9. Hypokalemia, replaced. 10.Class III congestive heart failure. 11.Now chronic atrial fibrillation with anticoagulation with Pradaxa. 12.Noncompliance. 13.Stop tobacco and alcohol use on April 30, 2010. 14.Thrombocytopenia.  DISCHARGE CONDITION:  Improved.  PROCEDURES:  AV node ablation and BiV pacemaker implantation by Dr. Lewayne Bunting on June 27, 2010.  DISCHARGE MEDICATIONS:  See medication reconciliation sheet.  The patient now allergic to AMIODARONE.  DISCHARGE INSTRUCTIONS: 1. See pacemaker discharge instruction sheet. 2. Increase activity slowly. 3. Low-sodium heart-healthy diet. 4. Follow up with Dr. Royann Shivers in 1 week, the office will call with     date and time. 5. Follow up with the [facility], our office will schedule     the appointment, and this she would be within 10 days and  then he     would need followup with Dr. Ladona Ridgel as per [facility].  Please     note, the patient is on Pradaxa for anticoagulation.  HISTORY OF PRESENT ILLNESS:  A 50 year old white male with history of severe nonischemic dilated cardiomyopathy and atrial fib refractory to medical therapy was admitted June 21, 2010, secondary for acute decompensation of heart failure.  His most recent EF was less than 25% by echo done on April 06, 2010.  There was left global ventricular hypokinesis.  He has a history of angiographically normal course by cath in August 2011.  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  The patient is in smarty compliance with medical therapy over the last several months.  An attempt at cardioversion was last performed in November and returned to sinus rhythm after 2 shocks, but immediately reverted to AFib and he had already been seen by Dr. Johney Frame with EP for possible radiofrequency ablation.  Mr. Justo had been a fairly heavy alcohol and tobacco user, but has abstained since April 30, 2010.  He had been feeling very well until about 2 weeks ago.  Unfortunately when he was  feeling well, he stopped his amiodarone, metoprolol, lisinopril. He continued his Pradaxa, furosemide, and dig and 2 weeks ago he began feeling unwell coughing copious amounts of clear to slightly yellow sputum a few days later.  He was seen [facility], diagnosed with bronchitis was given azithromycin.  Took the first day of loading dose, the second day of medication, and felt rapid palpitations and just generally unwell.  Presented to the office with Dr. Royann Shivers, was found to be in severely decompensated heart failure and severe tachycardia.  He was hypotensive with a blood pressure of 80/66 and dizzy, short of breath.  He was also coughing sometimes with hemoptysis.  He had denied any edema and no significant change in  his weight.  In fact he actually weighed 4 pounds less than his previous appointment.  He was then admitted to [facility] for further assistance.  EP consult was obtained after admission.  We also added Rocephin IV to his medical regimen.  Nutrition consult was given for 2-g sodium diet.  EP consult as stated was completed.  Dr. Ladona Ridgel was concerned with the patient's compliance problems and difficult to control ventricular rate with his AFib in addition to his nonischemic cardiomyopathy.  It was agreed he would undergo AV node ablation and BiV pacemaker placement.  The patient tolerated the procedure well and actually has done fairly well.  After initial diuresing, his Lasix was discontinued and his BNP at discharge was in the 600s.  Again borderline with his blood pressure.  By the time of discharge, he was ambulating.  The night before discharge, he had an episode where he became diaphoretic.  No other real complaints.  His glucose was stable at 69.  After he ate, he felt much better, and had no other complaints.  LABORATORY AT DISCHARGE:  Prior to discharge, hemoglobin 17, hematocrit 50.7, WBC 11.4, platelets are 112, neutrophils 134.  Protime at discharge 15.4, INR of 1.20.  CHEMISTRY AT DISCHARGE:  Sodium 137, potassium 4.4, chloride 103, CO2 of 29, glucose 74, BUN 15, creatinine 1.13, alkaline phos 133, SGOT 60, SGPT 321, these have declined significantly with initial SGOT 54, SGPT 53, SGOT peaked at 15,948 and SGPT peak at 6633.  Alkaline phos peaked at 160 and bilirubin peak was 4.7.  Initially creatinine was 1.17 on admission, developed acute renal failure with creatinine going up to 2.5.  Gradually improved and at discharge 1.17, albumin at discharge 2.4.  Amylase was 60, lipase was 93.  CKs were negative.  MB and troponin-I was negative.  BNP initially was 580, peak was 1196 on June 25, 2010, and prior to discharge BNP is 677.  Unable to do sputum  collection and blood cultures, no growth for 5 days.  RADIOLOGY:  Initial chest x-ray, patchy bilateral airspace disease and imaging features are probably related to diffuse infection, asymmetric pulmonary edema could have this appearance.  Chest x-ray appeared more to be pneumonia, continued on antibiotics and by chest x-ray on July 01, 2010, pacer wires in good position without complicating features, persistent with slightly improved bilateral lung infiltrates.  He is being discharged on Levaquin.  Please note, he had abdominal ultrasound that was portable on June 22, 2010.  Liver with no focal lesion identified.  The spleen was 10.3 cm, craniocaudal leaks unremarkable.  Right kidney 11.3 cm.  No hydronephrosis.  Well-preserved cortex.  Normal size.  Left kidney 12.1 cm.  No hydronephrosis.  Abdominal aorta, no aneurysm identified,  a small right pleural effusion is noted.  Gallbladder wall thickening and small amount of pericholecystic fluid, nonspecific findings associated with cholecystitis, hepatitis cirrhosis -proteinemia or adjacent inflammatory process.  EKGs revealed initially AFib with ST-T wave abnormality inferolaterally and at discharge he was AV pacing and sensing appropriately.  Blood pressure at discharge is 95 systolic.  The patient ambulated with his blood pressure feeling well.  No dizziness and no lightheadedness. He was seen by Dr. Lynnea Ferrier and felt stable for discharge and he will follow up as instructed.     Darcella Gasman. Annie Paras, N.P.   ______________________________ Thurmon Fair, MD    LRI/MEDQ  D:  07/01/2010  T:  07/02/2010  Job:  [id number]  cc:   Doylene Canning. Ladona Ridgel, MD  Electronically Signed by Nada Boozer N.P. on 07/04/2010 05:48:50 PM Electronically Signed by Thurmon Fair M.D. on 07/05/2010 08:48:00 PM

## 2010-07-12 NOTE — Op Note (Signed)
  [name], Mike Bean                  ACCOUNT NO.:  [id number]  MEDICAL RECORD NO.:  [id number]           PATIENT TYPE:  I  LOCATION:  2915                         FACILITY:  [facility]  PHYSICIAN:  Doylene Canning. Ladona Ridgel, MD    DATE OF BIRTH:  08-06-1960  DATE OF PROCEDURE:  06/29/2010 DATE OF DISCHARGE:                              OPERATIVE REPORT   PROCEDURE PERFORMED:  AV node ablation.  INDICATION:  Atrial fibrillation with a rapid ventricular response, uncontrolled with medical therapy.  INTRODUCTION:  The patient is a very pleasant 50 year old man who has a history of nonischemic cardiomyopathy, congestive heart failure, and atrial fibrillation with rapid ventricular response.  He has been intolerant of medical therapy and unable to tolerate amiodarone.  He continues to have rapid atrial fibrillation and despite medical therapy with beta-blockers, digoxin, his ventricular rates had been increased. He is now referred for AV node ablation.  The patient had previously undergone biventricular pacemaker insertion.  PROCEDURE:  After informed consent was obtained, the patient was taken to the Diagnostic EP Lab in the fasting state.  After usual preparation and draping, intravenous fentanyl and midazolam was given for sedation. A 30 mL of lidocaine was infiltrated in to the right groin.  A 7-French quadripolar ablation catheter was inserted percutaneously into the right femoral vein and advanced under fluoroscopic guidance into the right atrium.  Care was taken not to dislodge the pacing leads.  Two RF energy applications were delivered after the HV interval was measured which was 43 milliseconds.  This resulted in the creation of complete heart block with a narrow ventricular escape rhythm.  The patient tolerated this very nicely.  He was observed for 30 minutes and had no recurrent AV conduction.  He did have a junctional escape at 44 beats per minute.  He was subsequently returned to his  room in satisfactory condition after his pacemaker was reprogrammed to a rate of 90 beats per minute.  COMPLICATIONS:  There were no immediate procedure complications.  RESULTS:  Demonstrated successful AV node ablation in a patient with atrial fibrillation and uncontrolled ventricular response after biventricular pacemaker insertion.     Doylene Canning. Ladona Ridgel, MD     GWT/MEDQ  D:  06/29/2010  T:  06/30/2010  Job:  [id number]  Electronically Signed by Lewayne Bunting MD on 07/12/2010 04:39:21 PM

## 2010-07-12 NOTE — Consult Note (Signed)
Mike Bean, Mike Bean                  ACCOUNT NO.:  [id number]  MEDICAL RECORD NO.:  [id number]           PATIENT TYPE:  I  LOCATION:  2915                         FACILITY:  [facility]  PHYSICIAN:  Doylene Canning. Ladona Ridgel, MD    DATE OF BIRTH:  April 09, 1961  DATE OF CONSULTATION:  06/26/2010 DATE OF DISCHARGE:                                CONSULTATION   Consultation is requested by Thurmon Fair, MD  INDICATION FOR CONSULTATION:  Evaluation of persistent atrial fibrillation with rapid ventricular response associated with nonischemic cardiomyopathy, shock liver, and class IIIB congestive heart failure.  HISTORY OF PRESENT ILLNESS:  The patient is a pleasant middle-aged man, who developed atrial fibrillation many months ago.  He has been very difficult to control having failed cardioversions and medical therapy with amiodarone.  He has also history of noncompliance and alcohol abuse, and he after being cardioverted back in November 2011 was referred to Dr. Johney Frame for possible atrial fibrillation ablation.  With his alcohol abuse and noncompliance, it was felt very unlikely that he would be maintaining sinus rhythm with AFib ablation and strategy of rate control was recommended.  The patient has had trouble, not able to take his medicines, and has just been overall noncompliant.  He had been on amiodarone, but he has self reduced his dose as such that by the time he came into the hospital, he was in AFib and had been so for several weeks.  The patient has noted worsening heart failure symptoms and on admission to the hospital, he had evidence of shock liver.  The patient gradually improved with improvement in his blood pressure and his heart rate.  Despite this, he remains on a strategy of rate control with heart rates at rest in the 90-110 range and with activity going up into the 140-150 range.  He does not feel palpitations.  He has not had any frank syncope.  He does know the problems with  some diarrhea and GI discomfort, otherwise unremarkable.  FAMILY HISTORY:  Notable for his father having an MI in his 41s and grandfather in his 73s, and has two healthy children.  SOCIAL HISTORY:  The patient is married and separated.  He denies recreational drug use, though he stopped drinking alcohol approximately 2 months ago.  This is by his report.  He has previously been a very vigorous exerciser.  PAST MEDICAL HISTORY:  Notable for: 1. Congestive heart failure. 2. Atrial fibrillation. 3. Appendectomy remotely.  The patient notes that on AMIODARONE, he had nausea with vomiting.  PHYSICAL EXAMINATION:  GENERAL:  He is a pleasant, middle-aged man in no acute distress. VITAL SIGNS:  Blood pressure was 90/60, the pulse was 110 and irregularly irregular, respirations were 18, temperature 98. HEENT:  Normocephalic and atraumatic.  Pupils equal and round. Oropharynx is moist.  Sclerae anicteric. NECK:  No jugular venous distention.  There is no thyromegaly.  Trachea is midline.  Carotids are 2+ and symmetric. LUNGS:  Clear bilaterally to auscultation.  No wheezes, rales, or rhonchi are present except for rales in the bases bilaterally.  There is no increased work  of breathing. CARDIOVASCULAR:  Irregularly irregular tachycardia with normal S1 and S2.  The PMI was enlarged was laterally displaced. ABDOMEN:  Soft, nontender, nondistended.  No organomegaly. EXTREMITIES:  Demonstrated no cyanosis, clubbing, or edema.  Pulses are 2+ and symmetric. NEUROLOGIC:  Alert and oriented x3.  Cranial nerves intact.  Strength is 5/5 and symmetric.  EKG demonstrates atrial fibrillation with a narrow QRS rhythm.  IMPRESSION: 1. Nonischemic cardiomyopathy, ejection fraction 25%. 2. Class III congestive heart failure. 3. Persistent atrial fibrillation, unable to be controlled with     medical therapy. 4. Medical noncompliance. 5. Alcohol abuse.  DISCUSSION:  Mr. Garrelts problems are quite  difficult.  He has compliance problems and difficult to control ventricular rate because of his young age with his atrial fibrillation.  He also has a nonischemic cardiomyopathy.  There is a chance that this will be better though there is certainly a higher than average chance that it will not.  I have discussed the different treatment options with the patient.  The risks, benefits, goals, and expectations of BiV pacemaker insertion followed by AV node ablation would be very interesting idea in that the patient would then have adequate rate control despite his noncompliance. Ideally, we can place his pacemaker and control his rate and then see how he does before recommending a permanent pacemaker.     Doylene Canning. Ladona Ridgel, MD     GWT/MEDQ  D:  06/26/2010  T:  06/27/2010  Job:  [id number]  cc:   Thurmon Fair, MD  Electronically Signed by Lewayne Bunting MD on 07/12/2010 04:39:11 PM

## 2010-07-12 NOTE — Op Note (Signed)
NAMEENGLISH, CRAIGHEAD                  ACCOUNT NO.:  [id number]  MEDICAL RECORD NO.:  [id number]           PATIENT TYPE:  I  LOCATION:  2915                         FACILITY:  [facility]  PHYSICIAN:  Doylene Canning. Ladona Ridgel, MD    DATE OF BIRTH:  Jun 01, 1960  DATE OF PROCEDURE:  06/27/2010 DATE OF DISCHARGE:                              OPERATIVE REPORT   PROCEDURE PERFORMED:  Insertion of a biventricular pacemaker.  INDICATIONS:  Class III congestive heart failure, EF 25%, AFib with rapid ventricle response with plans for AV node ablation shortly.  INTRODUCTION:  The patient is a 50 year old male with a nonischemic cardiomyopathy, congestive heart failure class III, who was admitted to the hospital with worsening heart failure symptoms, AFib, and a rapid ventricular response.  He has had atrial fibrillation for over a year and failed multiple cardioversions.  He has been intolerant to amiodarone.  He has had rapid ventricular rate despite beta-blockers and digoxin, and is now referred for Bi-V pacemaker insertion with plans to proceed with an AV node ablation for permanent rate control.  PROCEDURE:  After informed consent was obtained, the patient was taken to the Diagnostic EP Lab in a fasting state.  After usual preparation and draping, intravenous fentanyl and midazolam were given for sedation. A 30 mL of lidocaine was infiltrated into the left infraclavicular region.  A 7-cm incision was carried out over this region. Electrocautery was utilized to dissect down to the fascial plane.  The left subclavian vein was punctured x3 and the St. Jude model 2088T 58-cm active fixation pacing lead serial #[id number] was advanced into the right ventricle and the St. Jude model 2088T 52-cm active fixation pacing lead serial #[id number] being advanced into the right atrium. Mapping was initially carried out in the right ventricle at the final site on the RV septum.  The R-waves were 15.  The pace impedance  was 600 ohms and threshold was less than a volt at 0.4 milliseconds.  A large injury current was present with active fixation of the lead and 10 volts pacing did not stimulate the diaphragm.  With the ventricular lead in satisfactory position, attention was then turned to placement of atrial leads, it was placed in the anterolateral portion of the right atrium. Fibrillation waves measured 1 to 1.5 millivolts.  The impedance was 500 ohms and 10 volts pacing did not stimulate the diaphragm.  With both atrial and ventricular leads in satisfactory position, attention was then turned to placement of the LV lead.  The coronary sinus guiding catheter was advanced into the right atrium.  A 6-French hexapolar EP catheter was inserted into the coronary sinus guiding catheter and subsequently used to cannulate the coronary sinus.  Having done this, the guiding catheter was advanced over the EP catheter into the coronary sinus.  Venography was carried out, which demonstrated a lateral vein which was acceptable for LV placement.  The vein itself was fairly small, however.  The subselective catheter was advanced into the lateral vein and the Medtronic Attain Ability model 4196, 88-cm passive fixation LV pacing lead serial number [id number]  was advanced into the lateral vein.  Diaphragmatic stimulation was not demonstrated at 10 volts and the threshold was around 1.5 volts at 0.4 milliseconds.  A 10 volt pacing did not stimulate the diaphragm.  Pacing impedance was around 1000 ohms.  With these satisfactory parameters, the leads were secured to the subpectoralis fascia with a figure-of-eight silk suture.  Sewing sleeve was also secured with silk suture.  Electrocautery was then utilized to make a subcutaneous pocket.  Antibiotic irrigation was utilized to irrigate the pocket.  Electrocautery utilized to assure hemostasis.  The St. Jude Bi-V pacemaker, serial number [id number] was connected to the atrial  RV and LV pacing leads and placed back in the subcutaneous pocket where it was secured with silk suture.  At this point, the pocket was irrigated with antibiotic irrigation.  Incision was closed with 2-0 and 3-0 Vicryl.  Benzoin and Steri-Strips were painted on skin.  A pressure dressing was applied.  The patient was returned to his room in satisfactory condition.  COMPLICATIONS:  There were no immediate procedure complications.  RESULTS:  This demonstrate successful implantation of a St. Jude Bi-V pacemaker in a patient with a nonischemic cardiomyopathy, congestive heart failure class III, and atrial fibrillation, and rapid ventricular response.  PLAN:  Will be to proceed with AV node ablation in the next few days after we have confirmed that the patient's Bi-V pacing is working satisfactorily.     Doylene Canning. Ladona Ridgel, MD     GWT/MEDQ  D:  06/27/2010  T:  06/28/2010  Job:  [id number]  cc:   Thurmon Fair, MD Hillis Range, MD  Electronically Signed by Lewayne Bunting MD on 07/12/2010 04:39:18 PM

## 2010-07-13 LAB — BASIC METABOLIC PANEL
Chloride: 105 mEq/L (ref 96–112)
GFR calc non Af Amer: >60 mL/min (ref >60)
Potassium: 4.3 mEq/L (ref 3.5–5.1)
Sodium: 137 mEq/L (ref 135–145)

## 2010-07-13 LAB — MAGNESIUM: Magnesium: 2.4 mg/dL (ref 1.5–2.5)

## 2010-07-13 LAB — COMPREHENSIVE METABOLIC PANEL
ALT: 43 U/L (ref 0–53)
Albumin: 2.9 g/dL — ABNORMAL LOW (ref 3.5–5.2)
Calcium: 8.4 mg/dL (ref 8.4–10.5)
Glucose, Bld: 93 mg/dL (ref 70–99)
Sodium: 140 mEq/L (ref 135–145)
Total Protein: 5.1 g/dL — ABNORMAL LOW (ref 6.0–8.3)

## 2010-07-13 LAB — BRAIN NATRIURETIC PEPTIDE
Pro B Natriuretic peptide (BNP): 765 pg/mL — ABNORMAL HIGH (ref 0.0–100.0)
Pro B Natriuretic peptide (BNP): 921 pg/mL — ABNORMAL HIGH (ref 0.0–100.0)

## 2010-07-13 LAB — MRSA PCR SCREENING: MRSA by PCR: NEGATIVE

## 2010-07-14 ENCOUNTER — Encounter: Payer: Self-pay | Admitting: Internal Medicine

## 2010-07-14 ENCOUNTER — Ambulatory Visit (INDEPENDENT_AMBULATORY_CARE_PROVIDER_SITE_OTHER): Payer: 59

## 2010-07-14 DIAGNOSIS — I4891 Unspecified atrial fibrillation: Secondary | ICD-10-CM

## 2010-07-14 DIAGNOSIS — I509 Heart failure, unspecified: Secondary | ICD-10-CM

## 2010-07-14 LAB — CBC
HCT: 42.4 % (ref 39.0–52.0)
HCT: 44.1 % (ref 39.0–52.0)
Hemoglobin: 14.7 g/dL (ref 13.0–17.0)
MCHC: 32.7 g/dL (ref 30.0–36.0)
MCV: 94.8 fL (ref 78.0–100.0)
MCV: 96.9 fL (ref 78.0–100.0)
Platelets: 141 10*3/uL — ABNORMAL LOW (ref 150–400)
RBC: 4.5 MIL/uL (ref 4.22–5.81)
RDW: 13 % (ref 11.5–15.5)
RDW: 13.1 % (ref 11.5–15.5)
WBC: 10.1 10*3/uL (ref 4.0–10.5)
WBC: 8 10*3/uL (ref 4.0–10.5)

## 2010-07-14 LAB — COMPREHENSIVE METABOLIC PANEL
ALT: 98 U/L — ABNORMAL HIGH (ref 0–53)
BUN: 12 mg/dL (ref 6–23)
Calcium: 7.9 mg/dL — ABNORMAL LOW (ref 8.4–10.5)
Creatinine, Ser: 1.14 mg/dL (ref 0.4–1.5)
Glucose, Bld: 87 mg/dL (ref 70–99)
Sodium: 135 mEq/L (ref 135–145)
Total Protein: 5.6 g/dL — ABNORMAL LOW (ref 6.0–8.3)

## 2010-07-14 LAB — BASIC METABOLIC PANEL
BUN: 9 mg/dL (ref 6–23)
CO2: 25 mEq/L (ref 19–32)
CO2: 27 mEq/L (ref 19–32)
Chloride: 101 mEq/L (ref 96–112)
Chloride: 102 mEq/L (ref 96–112)
Creatinine, Ser: 1.11 mg/dL (ref 0.4–1.5)
GFR calc Af Amer: >60 mL/min (ref >60)
GFR calc non Af Amer: >60 mL/min (ref >60)
Glucose, Bld: 118 mg/dL — ABNORMAL HIGH (ref 70–99)
Potassium: 3.6 mEq/L (ref 3.5–5.1)
Potassium: 3.9 mEq/L (ref 3.5–5.1)
Potassium: 4.3 mEq/L (ref 3.5–5.1)
Sodium: 134 mEq/L — ABNORMAL LOW (ref 135–145)
Sodium: 136 mEq/L (ref 135–145)

## 2010-07-14 LAB — LIPID PANEL
Cholesterol: 144 mg/dL (ref 0–200)
Triglycerides: 82 mg/dL (ref <150)

## 2010-07-14 LAB — CARDIAC PANEL(CRET KIN+CKTOT+MB+TROPI)
CK, MB: 1.9 ng/mL (ref 0.3–4.0)
Relative Index: 1.4 (ref 0.0–2.5)
Relative Index: 1.5 (ref 0.0–2.5)
Total CK: 124 U/L (ref 7–232)
Total CK: 188 U/L (ref 7–232)
Troponin I: 0.01 ng/mL (ref 0.00–0.06)
Troponin I: 0.01 ng/mL (ref 0.00–0.06)

## 2010-07-14 LAB — DIFFERENTIAL
Basophils Absolute: 0 10*3/uL (ref 0.0–0.1)
Lymphocytes Relative: 27 % (ref 12–46)
Lymphs Abs: 2.7 10*3/uL (ref 0.7–4.0)
Monocytes Absolute: 0.8 10*3/uL (ref 0.1–1.0)
Monocytes Relative: 8 % (ref 3–12)
Neutro Abs: 6.3 10*3/uL (ref 1.7–7.7)

## 2010-07-14 LAB — POCT CARDIAC MARKERS: Troponin i, poc: <0.05 ng/mL (ref 0.00–0.09)

## 2010-07-14 LAB — HEMOGLOBIN A1C
Hgb A1c MFr Bld: 5.6 % (ref <5.7)
Mean Plasma Glucose: 114 mg/dL (ref <117)

## 2010-07-14 LAB — URINALYSIS, ROUTINE W REFLEX MICROSCOPIC
Ketones, ur: NEGATIVE mg/dL
Nitrite: NEGATIVE
pH: 5.5 (ref 5.0–8.0)

## 2010-07-14 LAB — PROTIME-INR
INR: 1.1 (ref 0.00–1.49)
Prothrombin Time: 14.4 seconds (ref 11.6–15.2)

## 2010-07-14 LAB — CK TOTAL AND CKMB (NOT AT ARMC)
CK, MB: 3.8 ng/mL (ref 0.3–4.0)
Relative Index: 1.4 (ref 0.0–2.5)

## 2010-07-17 ENCOUNTER — Ambulatory Visit: Payer: 59

## 2010-07-18 NOTE — Procedures (Signed)
Summary: wound check /medtronic pacer/lg/check w/gt on adjusting lrl. ...   Current Medications (verified): 1)  Digoxin 0.125 Mg Tabs (Digoxin) .... Take One Tablet By Mouth Daily 2)  Metoprolol Tartrate 25 Mg Tabs (Metoprolol Tartrate) .... Take 1 By Mouth Two Times A Day 3)  Furosemide 40 Mg Tabs (Furosemide) .... Take 1 Tablets By Mouth Daily. and May Increase By One If Needed 4)  Aspirin 81 Mg Tbec (Aspirin) .... Take One Tablet By Mouth Daily 5)  Lisinopril 2.5 Mg Tabs (Lisinopril) .... Take One Tablet By Mouth Daily 6)  Pradaxa 150 Mg Caps (Dabigatran Etexilate Mesylate) .... Two Times A Day 7)  Multivitamins   Tabs (Multiple Vitamin) .... Once Daily 8)  Fish Oil 1000 Mg Caps (Omega-3 Fatty Acids) .... One By Mouth Daily  Allergies (verified): No Known Drug Allergies  PPM Specifications Following MD:  Lewayne Bunting, MD     PPM Vendor:  St Jude     PPM Model Number:  [id number]     PPM Serial Number:  [id number] PPM DOI:  06/27/2010     PPM Implanting MD:  Lewayne Bunting, MD  Lead 1    Location: RA     DOI: 07/26/2010     Model #: [id number]     Serial #: [id number]     Status: active Lead 2    Location: RV     DOI: 07/26/2010     Model #: [id number]     Serial #: [id number]     Status: active Lead 3    Location: LV     DOI: 07/26/2010     Model #: [id number]     Serial #: [id number]     Status: active  Magnet Response Rate:  BOL 100 ERI 85  Indications:  CHF A-fib   Tech Comments:  See [employer]

## 2010-07-18 NOTE — Miscellaneous (Signed)
Summary: Device preload  Clinical Lists Changes  Observations: Added new observation of PPM MODL#: 3210  (07/14/2010 7:03) Added new observation of PPM SERL#: [id number]  (07/14/2010 7:03) Added new observation of PPM INDICATN: CHF A-fib  (07/14/2010 7:03) Added new observation of MAGNET RTE: BOL 100 ERI 85  (07/14/2010 7:03) Added new observation of PPMLEADSTAT3: active  (07/14/2010 7:03) Added new observation of PPMLEADSER3: [id number]  (07/14/2010 7:03) Added new observation of PPMLEADMOD3: 4196  (07/14/2010 7:03) Added new observation of PPMLEADLOC3: LV  (07/14/2010 7:03) Added new observation of PPMLEADSTAT2: active  (07/14/2010 7:03) Added new observation of PPMLEADSER2: [id number]  (07/14/2010 7:03) Added new observation of PPMLEADMOD2: 2088TC  (07/14/2010 7:03) Added new observation of PPMLEADLOC2: RV  (07/14/2010 7:03) Added new observation of PPMLEADSTAT1: active  (07/14/2010 7:03) Added new observation of PPMLEADSER1: [id number]  (07/14/2010 7:03) Added new observation of PPMLEADMOD1: 2088TC  (07/14/2010 7:03) Added new observation of PPMLEADLOC1: RA  (07/14/2010 7:03) Added new observation of PPM IMP MD: Lewayne Bunting, MD  (07/14/2010 7:03) Added new observation of PPMLEADDOI3: 06/27/2010  (07/14/2010 7:03) Added new observation of PPMLEADDOI2: 06/27/2010  (07/14/2010 7:03) Added new observation of PPMLEADDOI1: 06/27/2010  (07/14/2010 7:03) Added new observation of PPM DOI: 06/27/2010  (07/14/2010 7:03) Added new observation of PACEMAKERMFG: St Jude  (07/14/2010 7:03) Added new observation of PACEMAKER MD: Lewayne Bunting, MD  (07/14/2010 7:03)      PPM Specifications Following MD:  Lewayne Bunting, MD     PPM Vendor:  St Jude     PPM Model Number:  3210     PPM Serial Number:  [id number] PPM DOI:  06/27/2010     PPM Implanting MD:  Lewayne Bunting, MD  Lead 1    Location: RA     DOI: 06/27/2010     Model #: [id number]     Serial #: [id number]     Status: active Lead 2    Location: RV      DOI: 06/27/2010     Model #: [id number]     Serial #: [id number]     Status: active Lead 3    Location: LV     DOI: 06/27/2010     Model #: [id number]     Serial #: [id number]     Status: active  Magnet Response Rate:  BOL 100 ERI 85  Indications:  CHF A-fib

## 2010-08-02 ENCOUNTER — Ambulatory Visit (INDEPENDENT_AMBULATORY_CARE_PROVIDER_SITE_OTHER): Payer: 59 | Admitting: *Deleted

## 2010-08-02 DIAGNOSIS — I459 Conduction disorder, unspecified: Secondary | ICD-10-CM

## 2010-08-02 DIAGNOSIS — I509 Heart failure, unspecified: Secondary | ICD-10-CM

## 2010-08-02 DIAGNOSIS — I426 Alcoholic cardiomyopathy: Secondary | ICD-10-CM

## 2010-10-03 ENCOUNTER — Encounter: Payer: Self-pay | Admitting: Internal Medicine

## 2010-10-24 ENCOUNTER — Encounter: Payer: 59 | Admitting: Internal Medicine

## 2011-08-03 ENCOUNTER — Telehealth: Payer: Self-pay | Admitting: Internal Medicine

## 2011-08-03 ENCOUNTER — Encounter: Payer: Self-pay | Admitting: Internal Medicine

## 2011-08-03 NOTE — Telephone Encounter (Signed)
08-03-11 SENT PAST DUE LETTER/MT

## 2011-11-29 ENCOUNTER — Encounter: Payer: Self-pay | Admitting: Internal Medicine

## 2011-11-29 NOTE — Telephone Encounter (Signed)
11-29-11 lmm @ 453pm for pt to call to set up past due pacer check with taylor or brooke, or let us know if being checked elsewhere/mt

## 2012-05-14 ENCOUNTER — Other Ambulatory Visit (HOSPITAL_COMMUNITY): Payer: Self-pay | Admitting: Cardiovascular Disease

## 2012-05-14 DIAGNOSIS — R931 Abnormal findings on diagnostic imaging of heart and coronary circulation: Secondary | ICD-10-CM

## 2012-05-14 DIAGNOSIS — I509 Heart failure, unspecified: Secondary | ICD-10-CM

## 2012-05-14 DIAGNOSIS — R0602 Shortness of breath: Secondary | ICD-10-CM

## 2012-05-29 IMAGING — US US ABDOMEN PORT
1 series · 14 of 25 positions shown · non-contrast
Comparison: None.

CLINICAL DATA: Abdominal pain

COMPLETE ABDOMINAL ULTRASOUND

[Series 1: us abdomen port · 0.30mm/px · 14 of 100 slices shown]
[im 1/100]
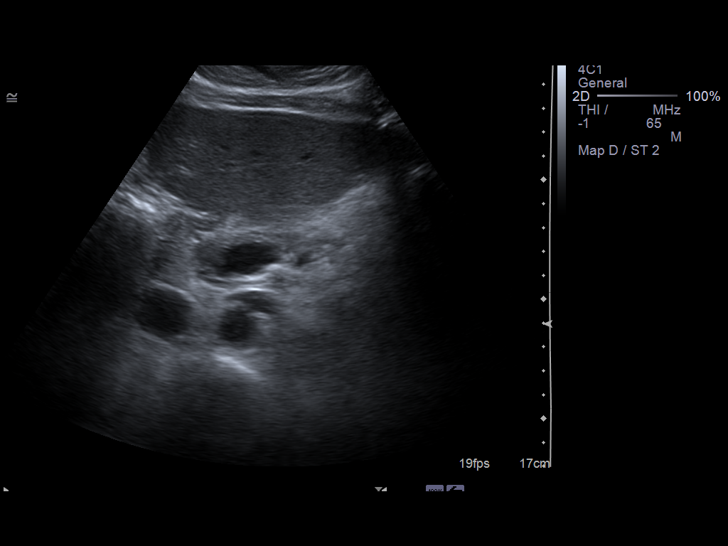
[im 9/100]
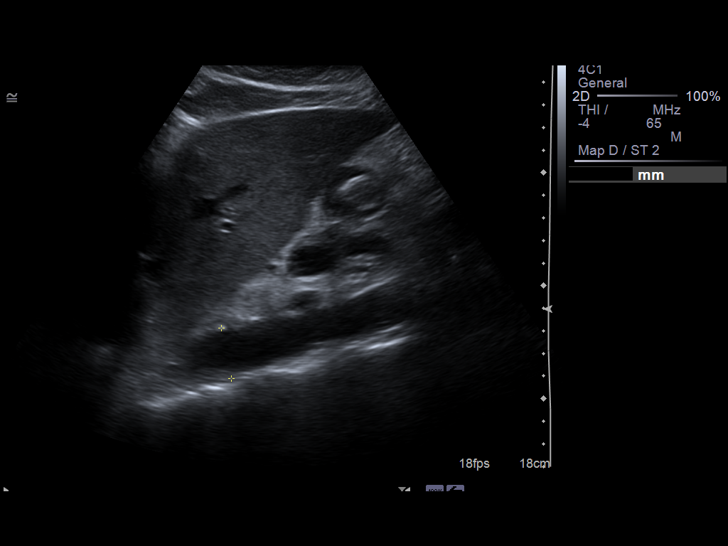
[im 17/100]
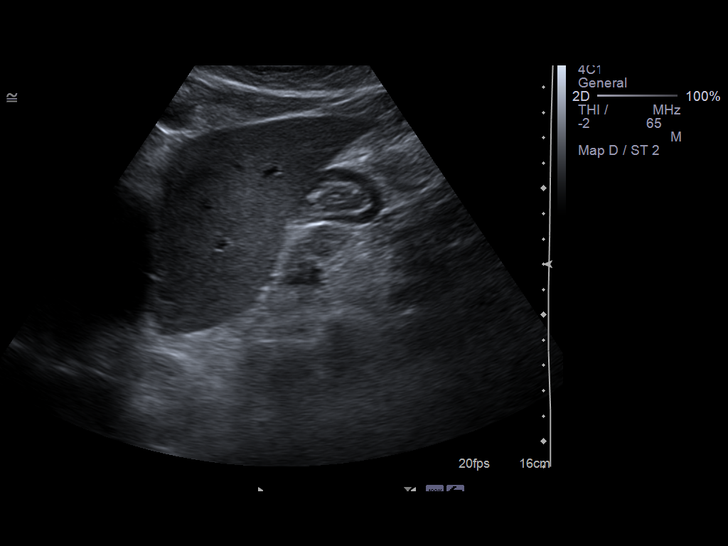
[im 25/100]
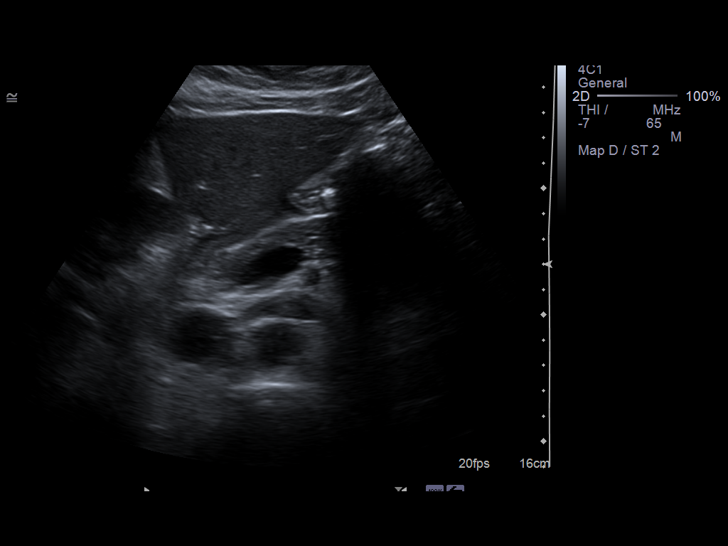
[im 34/100]
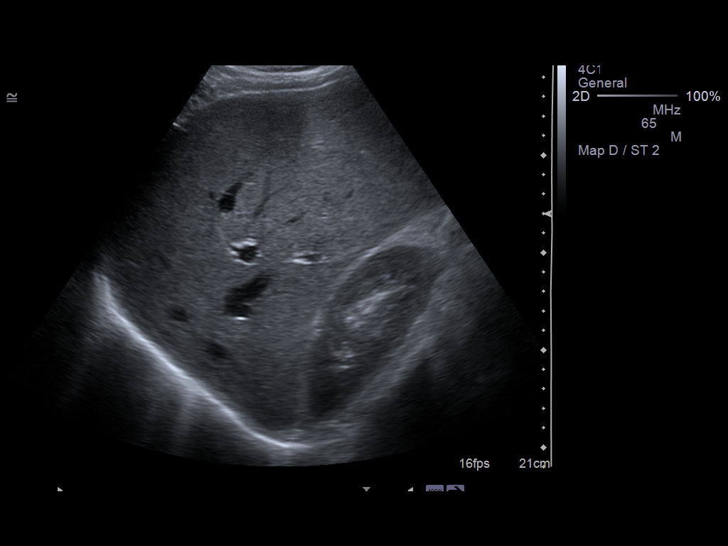
[im 38/100]
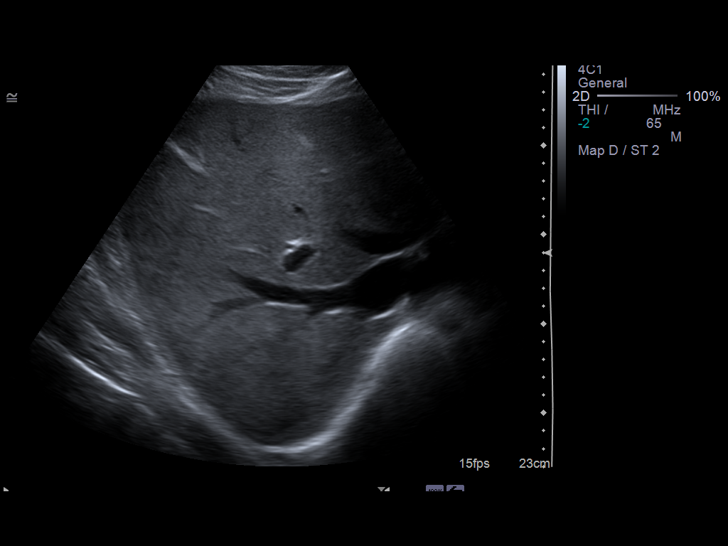
[im 46/100]
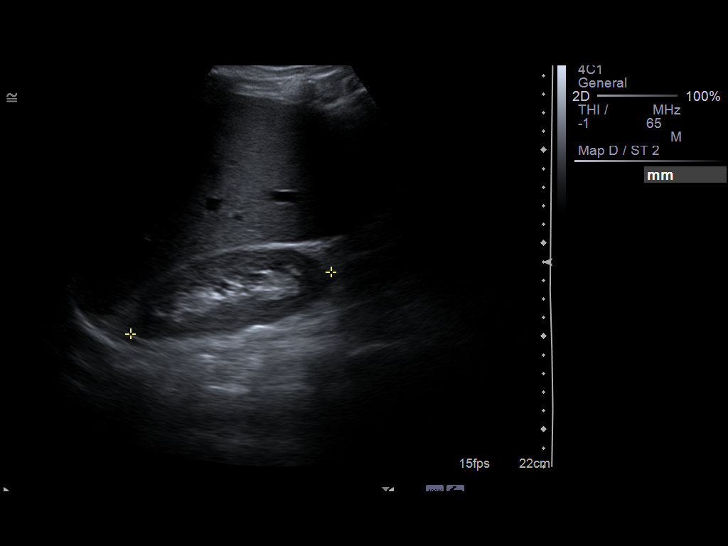
[im 54/100]
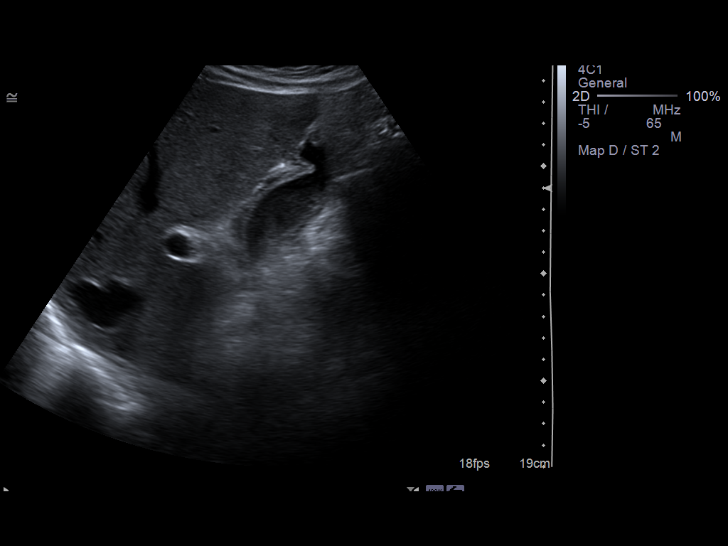
[im 62/100]
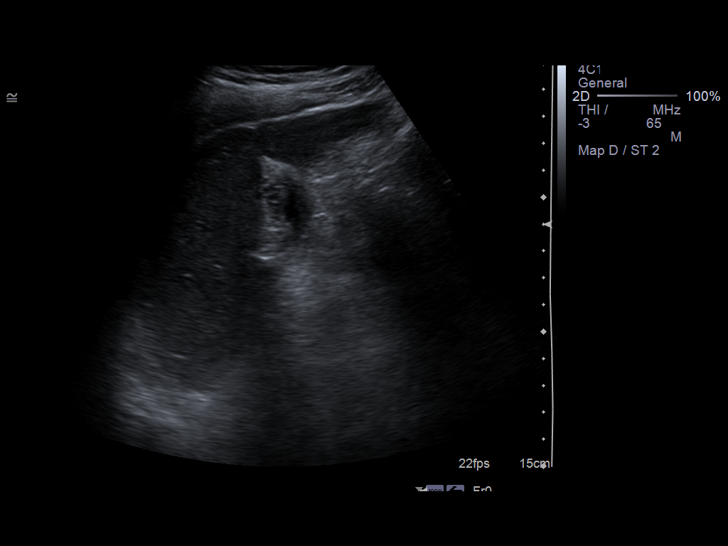
[im 67/100]
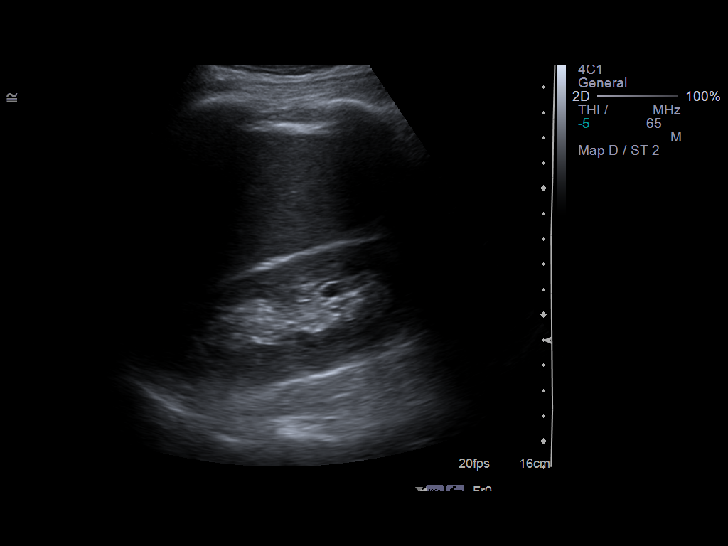
[im 75/100]
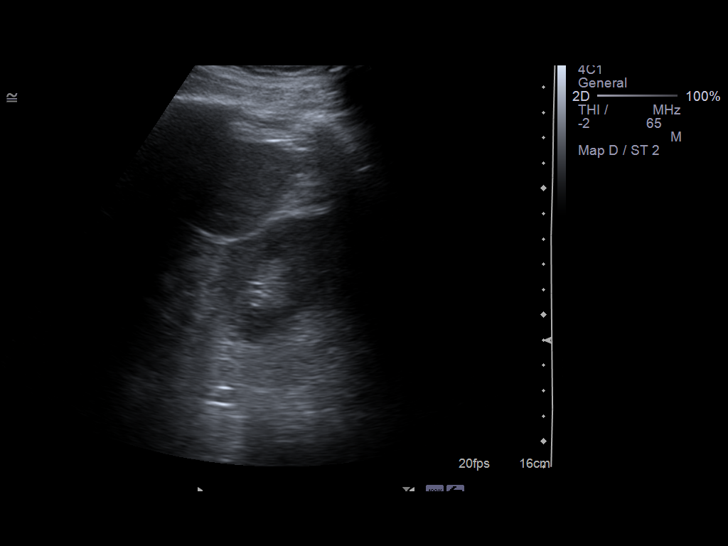
[im 83/100]
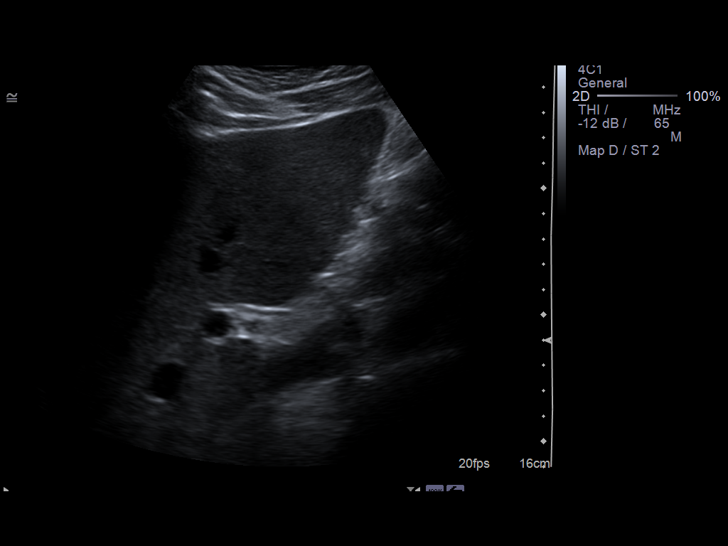
[im 91/100]
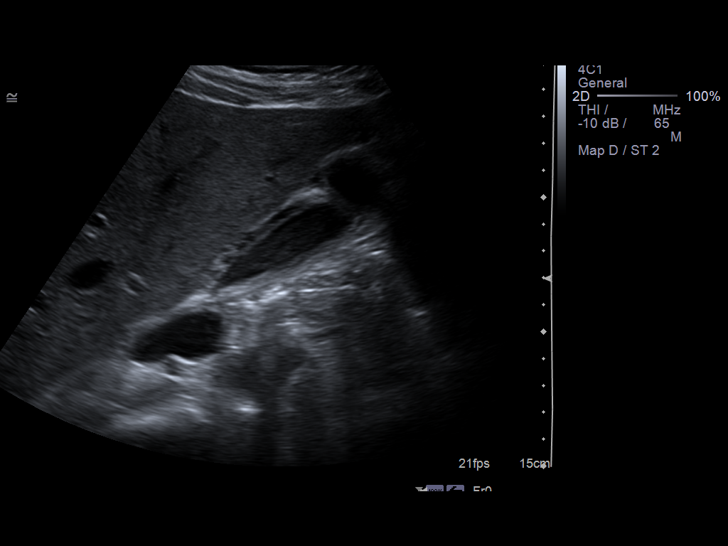
[im 100/100]
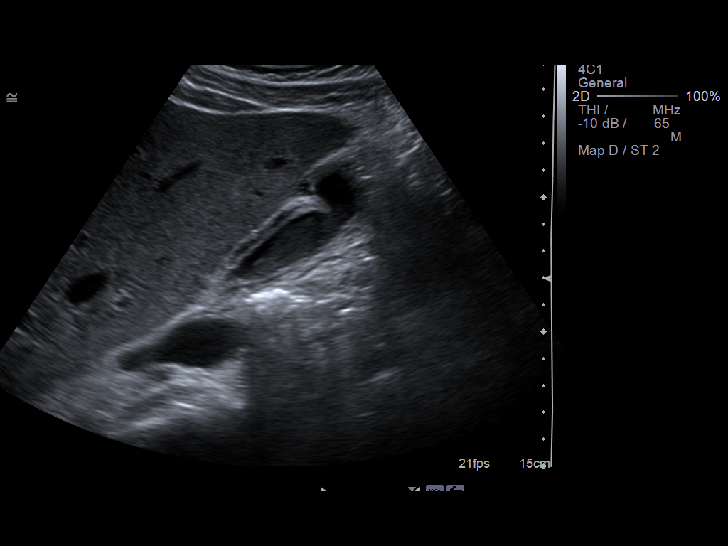

[14 of 25 positions shown; findings below may reference images not displayed]

FINDINGS: Gallbladder:  There is layering sludge in the gallbladder with no
discrete stones.  There is wall thickening up to 6 mm with a small
amount of pericholecystic fluid.  Sonographer reports no
sonographic Murphy's sign however.

Common bile duct:  2.5 mm diameter, unremarkable

Liver:  No focal lesion identified.  Within normal limits in
parenchymal echogenicity.

IVC:  Appears normal.

Pancreas:  No focal abnormality seen.

Spleen:  10.3 cm craniocaudal length, unremarkable

Right Kidney:  11.3 cm. No hydronephrosis.  Well-preserved cortex.
Normal size and parenchymal echotexture without focal
abnormalities.

Left Kidney:  12.1 cm. No hydronephrosis.  Well-preserved cortex.
Normal size and parenchymal echotexture without focal
abnormalities.

Abdominal aorta:  No aneurysm identified.

A small right pleural effusion is noted.
IMPRESSION: 1.  Gallbladder wall thickening and a small amount of
pericholecystic fluid, nonspecific findings associated with
cholecystitis, hepatitis, cirrhosis, hypoproteinemia, or adjacent
inflammatory processes.
2.  Small right pleural effusion.

## 2012-05-30 IMAGING — CR DG CHEST 2V
1 series · 1 of 1 positions shown · non-contrast
Comparison: Portable chest x-ray of 06/21/2010

CLINICAL DATA: Short of breath, cough, intermittent fever

CHEST - 2 VIEW

[w chest lat]
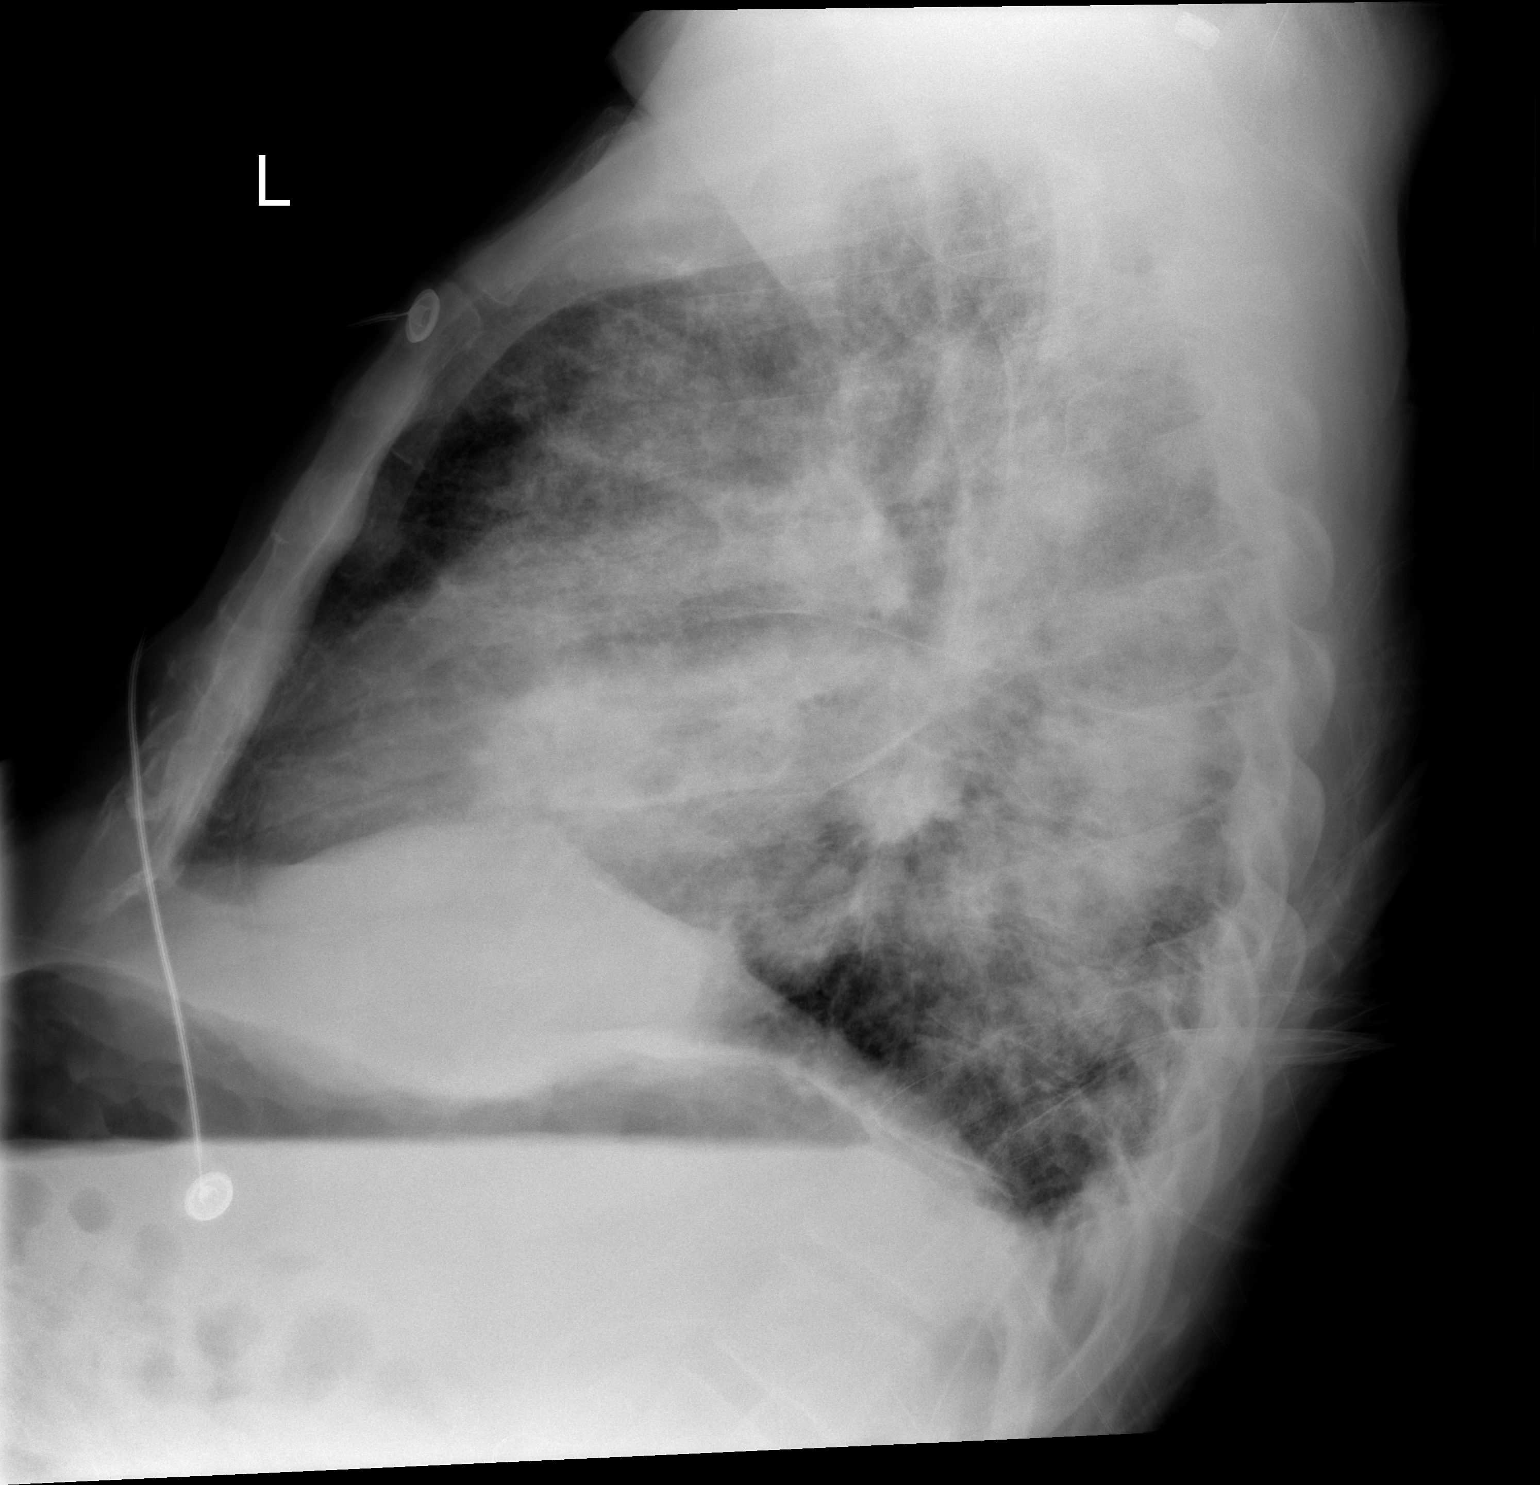

[1 of 1 positions shown; findings below may reference images not displayed]

FINDINGS: There is persistent airspace disease in both mid and
lower lung fields most consistent with pneumonia.   Tiny effusions
cannot be excluded on the lateral view.  Cardiomegaly is stable.
No bony abnormality is seen.
IMPRESSION: Patchy airspace disease remains in both mid and lower lung fields
most consistent with pneumonia.

## 2012-06-10 ENCOUNTER — Encounter: Payer: Self-pay | Admitting: *Deleted

## 2012-06-11 ENCOUNTER — Ambulatory Visit (HOSPITAL_COMMUNITY)
Admission: RE | Admit: 2012-06-11 | Discharge: 2012-06-11 | Disposition: A | Discharge status: Home / Self Care | Payer: BC Managed Care – PPO | Source: Ambulatory Visit | Attending: Cardiovascular Disease | Admitting: Cardiovascular Disease

## 2012-06-11 DIAGNOSIS — I4891 Unspecified atrial fibrillation: Secondary | ICD-10-CM | POA: Insufficient documentation

## 2012-06-11 DIAGNOSIS — I059 Rheumatic mitral valve disease, unspecified: Secondary | ICD-10-CM | POA: Insufficient documentation

## 2012-06-11 DIAGNOSIS — I369 Nonrheumatic tricuspid valve disorder, unspecified: Secondary | ICD-10-CM | POA: Insufficient documentation

## 2012-06-11 DIAGNOSIS — I509 Heart failure, unspecified: Secondary | ICD-10-CM | POA: Insufficient documentation

## 2012-06-11 DIAGNOSIS — R931 Abnormal findings on diagnostic imaging of heart and coronary circulation: Secondary | ICD-10-CM

## 2012-06-11 DIAGNOSIS — R0602 Shortness of breath: Secondary | ICD-10-CM

## 2012-06-11 NOTE — Progress Notes (Signed)
2D Echo Performed 06/11/2012    Ifeoluwa Bartz, RCS  

## 2012-10-21 ENCOUNTER — Telehealth: Payer: Self-pay | Admitting: Internal Medicine

## 2012-10-21 NOTE — Telephone Encounter (Signed)
10-21-12 called pt to set up past due pacer ck, ok with brooke, pt to call back/mt

## 2012-11-28 ENCOUNTER — Telehealth: Payer: Self-pay | Admitting: Internal Medicine

## 2012-11-28 ENCOUNTER — Encounter: Payer: Self-pay | Admitting: Internal Medicine

## 2012-11-28 NOTE — Telephone Encounter (Addendum)
8--14 sent past due letter, talked with pt 10-21-12 said he would call back to set up an appt, no appt scheduled/mt 12-16-12 sent letter certified, no check for two years/mt

## 2012-12-16 ENCOUNTER — Encounter: Payer: Self-pay | Admitting: Internal Medicine

## 2013-01-15 ENCOUNTER — Telehealth: Payer: Self-pay | Admitting: Internal Medicine

## 2013-01-15 NOTE — Telephone Encounter (Signed)
01-02-13 received certified letter signed by pt/mt

## 2015-07-25 ENCOUNTER — Encounter: Payer: Self-pay | Admitting: Internal Medicine

## 2015-09-14 ENCOUNTER — Ambulatory Visit (AMBULATORY_SURGERY_CENTER): Payer: Self-pay

## 2015-09-14 ENCOUNTER — Telehealth: Payer: Self-pay

## 2015-09-14 VITALS — Ht 70.0 in | Wt 206.0 lb

## 2015-09-14 DIAGNOSIS — Z1211 Encounter for screening for malignant neoplasm of colon: Secondary | ICD-10-CM

## 2015-09-14 MED ORDER — NA SULFATE-K SULFATE-MG SULF 17.5-3.13-1.6 GM/177ML PO SOLN: 1.0000 | Freq: Once | ORAL | Status: DC

## 2015-09-14 NOTE — Telephone Encounter (Signed)
Pt is a direct colon. He has a pacemaker, hx of etoh abuse, afib, chf, and copd. His records indicate he was on Pradaxa along with other heart meds. Pt stated his cardiologist took him off all his meds in 2014 and he hasn't been back since. I'm not able to find a record of him being taken off the meds, just a lot of missed appts to check his pacemaker. FYI- He also had a colonoscopy scheduled with [facility] two months ago, which he said he had to cancel because his insurance. Anyway, do you want cardiac clearance, OV, or is he okay for a direct colon? Please advise, Thank-you Payten Hobin-PV

## 2015-09-14 NOTE — Progress Notes (Signed)
No egg or soy allergy.  No previous complications from anesthesia. No home O2. No diet meds. 

## 2015-09-15 ENCOUNTER — Encounter: Payer: Self-pay | Admitting: *Deleted

## 2015-09-15 ENCOUNTER — Encounter: Payer: Self-pay | Admitting: Internal Medicine

## 2015-09-15 NOTE — Telephone Encounter (Signed)
Office visit. He is not established with me, thus can be with any GI provider or AP. Thanks

## 2015-09-15 NOTE — Telephone Encounter (Signed)
I called the patient to schedule an OV With MD - he states he wants to see cardiology and get all his heart issues addressed and then he will call back to RS his colon or schedule OV , which ever we need to do.  He has not seen cardiology in 3 years . I explained to pt that cardiologist may want to do further testing and all that will need to be totally completed before we can do his colon. Pt verbalized understanding of this paln and will call back and RS once all this complete Cancelled 5-31 colon Lelan Pons PV

## 2015-09-15 NOTE — Telephone Encounter (Signed)
Error

## 2015-09-28 ENCOUNTER — Encounter: Payer: Self-pay | Admitting: Internal Medicine

## 2016-07-25 ENCOUNTER — Ambulatory Visit (INDEPENDENT_AMBULATORY_CARE_PROVIDER_SITE_OTHER): Payer: BLUE CROSS/BLUE SHIELD | Admitting: Internal Medicine

## 2016-07-25 ENCOUNTER — Encounter (INDEPENDENT_AMBULATORY_CARE_PROVIDER_SITE_OTHER): Payer: Self-pay

## 2016-07-25 ENCOUNTER — Encounter: Payer: Self-pay | Admitting: Internal Medicine

## 2016-07-25 VITALS — BP 116/80 | HR 84 | Ht 70.0 in | Wt 229.0 lb

## 2016-07-25 DIAGNOSIS — I481 Persistent atrial fibrillation: Secondary | ICD-10-CM

## 2016-07-25 DIAGNOSIS — I4811 Longstanding persistent atrial fibrillation: Secondary | ICD-10-CM

## 2016-07-25 DIAGNOSIS — I519 Heart disease, unspecified: Secondary | ICD-10-CM | POA: Diagnosis not present

## 2016-07-25 DIAGNOSIS — I429 Cardiomyopathy, unspecified: Secondary | ICD-10-CM

## 2016-07-25 DIAGNOSIS — I426 Alcoholic cardiomyopathy: Secondary | ICD-10-CM | POA: Diagnosis not present

## 2016-07-25 LAB — CUP PACEART INCLINIC DEVICE CHECK
Brady Statistic RA Percent Paced: 0 %
Brady Statistic RV Percent Paced: 99.74 %
Implantable Lead Implant Date: 20120228
Implantable Lead Location: 753858
Implantable Lead Location: 753860
Implantable Pulse Generator Implant Date: 20120228
Lead Channel Impedance Value: 487.5 Ohm
Lead Channel Pacing Threshold Amplitude: 0.75 V
Lead Channel Pacing Threshold Pulse Width: 0.5 ms
Lead Channel Sensing Intrinsic Amplitude: 12 mV
Lead Channel Setting Pacing Amplitude: 2.375
Lead Channel Setting Sensing Sensitivity: 2 mV
MDC IDC LEAD IMPLANT DT: 20120228
MDC IDC LEAD IMPLANT DT: 20120228
MDC IDC LEAD LOCATION: 753859
MDC IDC MSMT BATTERY VOLTAGE: 2.9 V
MDC IDC MSMT LEADCHNL LV IMPEDANCE VALUE: 787.5 Ohm
MDC IDC MSMT LEADCHNL LV PACING THRESHOLD AMPLITUDE: 1.5 V
MDC IDC MSMT LEADCHNL LV PACING THRESHOLD AMPLITUDE: 1.5 V
MDC IDC MSMT LEADCHNL LV PACING THRESHOLD PULSEWIDTH: 0.5 ms
MDC IDC MSMT LEADCHNL RV PACING THRESHOLD AMPLITUDE: 0.75 V
MDC IDC MSMT LEADCHNL RV PACING THRESHOLD PULSEWIDTH: 0.5 ms
MDC IDC MSMT LEADCHNL RV PACING THRESHOLD PULSEWIDTH: 0.5 ms
MDC IDC PG SERIAL: 2485643
MDC IDC SESS DTM: 20180328163614
MDC IDC SET LEADCHNL LV PACING PULSEWIDTH: 0.5 ms
MDC IDC SET LEADCHNL RV PACING AMPLITUDE: 2 V
MDC IDC SET LEADCHNL RV PACING PULSEWIDTH: 0.5 ms
Pulse Gen Model: 3210

## 2016-07-25 MED ORDER — APIXABAN 5 MG PO TABS: 5.0000 mg | ORAL_TABLET | Freq: Two times a day (BID) | ORAL | 0 refills | Status: DC

## 2016-07-25 MED ORDER — APIXABAN 5 MG PO TABS: 5.0000 mg | ORAL_TABLET | Freq: Two times a day (BID) | ORAL | 11 refills | Status: DC

## 2016-07-25 NOTE — Patient Instructions (Addendum)
Your physician has recommended you make the following change in your medication:  1.) START ELIQUIS 5 MG TWO TIMES PER DAY---START AFTER COLONOSCOPY  Your physician has requested that you have an echocardiogram. Echocardiography is a painless test that uses sound waves to create images of your heart. It provides your doctor with information about the size and shape of your heart and how well your heart's chambers and valves are working. This procedure takes approximately one hour. There are no restrictions for this procedure.  Your physician recommends that you schedule a follow-up appointment in: [place]. Lovena Le.

## 2016-07-25 NOTE — Progress Notes (Signed)
HPI Mike Bean returns today to reestablish after a 5 year absence from our arrhythmia clinic. He is a 56 yo man with chronic and uncontrolled atrial fibrillation, s/p AV node ablation and Biv PPM insertion. In the interim he has done well but was referred for preoperative evaluation prior to colonoscopy. He denies chest pain, sob, and has had no syncope. Minimal peripheral edema. No palpitations. No Known Allergies   Current Outpatient Prescriptions  Medication Sig Dispense Refill  . aspirin 81 MG tablet Take 81 mg by mouth daily. Reported on 09/14/2015    . apixaban (ELIQUIS) 5 MG TABS tablet Take 1 tablet (5 mg total) by mouth 2 (two) times daily. DO NOT START UNTIL AFTER COLONOSCOPY 60 tablet 11  . apixaban (ELIQUIS) 5 MG TABS tablet Take 1 tablet (5 mg total) by mouth 2 (two) times daily. DO NOT START UNTIL AFTER COLONOSCOPY 60 tablet 0   No current facility-administered medications for this visit.      Past Medical History:  Diagnosis Date  . CHF (congestive heart failure) ([place])   . History of alcohol abuse   . Hyperlipidemia   . Nonischemic cardiomyopathy ([place])    Presumed to be tachycardia mediated  . Pacemaker 2012  . Persistent atrial fibrillation ([place]) 2012  . Snores     ROS:   All systems reviewed and negative except as noted in the HPI.   Past Surgical History:  Procedure Laterality Date  . APPENDECTOMY     S/P  . CARDIAC CATHETERIZATION  11/2009   Normal coronary arteries   . PACEMAKER INSERTION  2012   st jude     Family History  Problem Relation Age of Onset  . Heart attack Father 56    MI  . Colon cancer Neg Hx      Social History   Social History  . Marital status: Divorced    Spouse name: N/A  . Number of children: N/A  . Years of education: N/A   Occupational History  . Works in Psychologist, occupational   Social History Main Topics  . Smoking status: Current Every Day Smoker    Packs/day: 0.50    Types: Cigarettes  .  Smokeless tobacco: Never Used  . Alcohol use 12.6 oz/week    21 Standard drinks or equivalent per week     Comment: At least 3 beers a day  . Drug use: No  . Sexual activity: Not on file   Other Topics Concern  . Not on file   Social History Narrative   Lives in [place]   Separated   2 children, no grandchildren     BP 116/80   Pulse 84   Ht 5\' 10"  (1.778 m)   Wt 229 lb (103.9 kg)   SpO2 98%   BMI 32.86 kg/m   Physical Exam:  Well appearing 56 yo man, NAD HEENT: Unremarkable Neck:  6 cm JVD, no thyromegally Lymphatics:  No adenopathy Back:  No CVA tenderness Lungs:  Clear with no wheezes HEART:  Regular rate rhythm, no murmurs, no rubs, no clicks Abd:  soft, positive bowel sounds, no organomegally, no rebound, no guarding Ext:  2 plus pulses, no edema, no cyanosis, no clubbing Skin:  No rashes no nodules Neuro:  CN II through XII intact, motor grossly intact  EKG - atrial fib with biv pacing  DEVICE  Normal device function.  See PaceArt for details.   Assess/Plan: 1. Atrial fib - his rate  is controlled. I have recommended he start eliquis. 2. Chronic systolic heart failure - his symptoms are class 1. We will recheck his EF as I suspect his EF has normalized and if so would not have him take either a beta blocker or ACE inhibitor. 3. BiV PPM - his St. Jude device is working normally despite not being checked in over 5 years.  4. Obesity - his weight is stable. We will encourage weight loss going forward.  Mike Bean.D.

## 2016-08-08 ENCOUNTER — Other Ambulatory Visit (HOSPITAL_COMMUNITY): Payer: BLUE CROSS/BLUE SHIELD

## 2016-08-21 ENCOUNTER — Other Ambulatory Visit (HOSPITAL_COMMUNITY): Payer: BLUE CROSS/BLUE SHIELD

## 2016-09-13 ENCOUNTER — Encounter: Payer: Self-pay | Admitting: Nurse Practitioner

## 2016-09-25 ENCOUNTER — Encounter: Payer: BLUE CROSS/BLUE SHIELD | Admitting: Internal Medicine

## 2016-09-25 ENCOUNTER — Encounter (INDEPENDENT_AMBULATORY_CARE_PROVIDER_SITE_OTHER): Payer: Self-pay

## 2016-09-25 ENCOUNTER — Encounter: Payer: Self-pay | Admitting: Nurse Practitioner

## 2016-09-25 ENCOUNTER — Ambulatory Visit (INDEPENDENT_AMBULATORY_CARE_PROVIDER_SITE_OTHER): Payer: BLUE CROSS/BLUE SHIELD | Admitting: Nurse Practitioner

## 2016-09-25 VITALS — BP 128/60 | HR 70 | Ht 70.5 in | Wt 229.4 lb

## 2016-09-25 DIAGNOSIS — Z1211 Encounter for screening for malignant neoplasm of colon: Secondary | ICD-10-CM

## 2016-09-25 MED ORDER — NA SULFATE-K SULFATE-MG SULF 17.5-3.13-1.6 GM/177ML PO SOLN: 1.0000 | Freq: Once | ORAL | 0 refills | Status: AC

## 2016-09-25 NOTE — Progress Notes (Signed)
HPI:  Patient is a 56 year old male here for colon cancer screening. He has never had a colonoscopy. He has atrial fibrillation and chronic systolic heart failure. He is s/p BiV PPM in 2012.  No GI complaints such as abdominal pain, bowel changes or blood in stool. Patient recently saw Dr. Crissie Sickles for cardiac clearance for a colonoscopy. Plan was to obtain an echocardiogram since the last one was in 2012. Following that, he would start Eliquis after colonoscopy. Patient did not get his echocardiogram, it was going to be $1800.  Patient has no chest pain, palpitations, shortness of breath or swelling of legs / feet. He feels fine.   Past Medical History:  Diagnosis Date  . CHF (congestive heart failure) ([place])   . History of alcohol abuse   . Hyperlipidemia   . Nonischemic cardiomyopathy ([place])    Presumed to be tachycardia mediated  . Pacemaker 2012  . Persistent atrial fibrillation ([place]) 2012  . Snores     Past Surgical History:  Procedure Laterality Date  . APPENDECTOMY     S/P  . CARDIAC CATHETERIZATION  11/2009   Normal coronary arteries   . PACEMAKER INSERTION  2012   st jude   Family History  Problem Relation Age of Onset  . Heart attack Father 54       MI  . Colon cancer Neg Hx    Social History  Substance Use Topics  . Smoking status: Current Every Day Smoker    Packs/day: 0.25    Types: Cigarettes  . Smokeless tobacco: Never Used  . Alcohol use 12.6 oz/week    21 Standard drinks or equivalent per week     Comment: At least 3 beers a day   Current Outpatient Prescriptions  Medication Sig Dispense Refill  . aspirin 81 MG tablet Take 81 mg by mouth daily. Reported on 09/14/2015    . apixaban (ELIQUIS) 5 MG TABS tablet Take 1 tablet (5 mg total) by mouth 2 (two) times daily. DO NOT START UNTIL AFTER COLONOSCOPY (Patient not taking: Reported on 09/25/2016) 60 tablet 11  . apixaban (ELIQUIS) 5 MG TABS tablet Take 1 tablet (5 mg total) by mouth 2 (two) times  daily. DO NOT START UNTIL AFTER COLONOSCOPY (Patient not taking: Reported on 09/25/2016) 60 tablet 0   No current facility-administered medications for this visit.    No Known Allergies   Review of Systems: All systems reviewed and negative except where noted in HPI.   Physical Exam: BP 128/60   Pulse 70   Ht 5' 10.5" (1.791 m)   Wt 229 lb 6 oz (104 kg)   SpO2 98%   BMI 32.45 kg/m  Constitutional:  Well-developed, white male in no acute distress. Psychiatric: Normal mood and affect. Behavior is normal. EENT:  Conjunctivae are normal. No scleral icterus. Neck supple.  Cardiovascular: Normal rate, regular rhythm.  Pulmonary/chest: Effort normal and breath sounds normal. No wheezing, rales or rhonchi. Abdominal: Soft, nondistended, nontender. Bowel sounds active throughout. There are no masses palpable. No hepatomegaly. Extremities: no edema Lymphadenopathy: No cervical adenopathy noted. Neurological: Alert and oriented to person place and time. Skin: Skin is warm and dry. No rashes noted.   ASSESSMENT AND PLAN:  1. Pleasant 56 year old male presenting for first Screening colonoscopy. He has no bowel changes, rectal bleeding or other alarm symptoms.  -Patient will be scheduled for a screening colonoscopy with possible polypectomy.  The risks and benefits of the procedure were discussed and  the patient agrees to proceed.   2. Afib, s/p ablation / chronic systolic heart failure. EF in 2012 was 35-40%. No chest pain, SOB, or lower extremity swelling. He is to start Eliquis after colonoscopy.    Tye Savoy, NP  09/25/2016, 1:48 PM

## 2016-09-25 NOTE — Patient Instructions (Signed)
If you are age 58 or older, your body mass index should be between 23-30. Your Body mass index is 32.45 kg/m. If this is out of the aforementioned range listed, please consider follow up with your Primary Care Provider.  If you are age 38 or younger, your body mass index should be between 19-25. Your Body mass index is 32.45 kg/m. If this is out of the aformentioned range listed, please consider follow up with your Primary Care Provider.   You have been scheduled for a colonoscopy. Please follow written instructions given to you at your visit today.  Please pick up your prep supplies at the pharmacy within the next 1-3 days. If you use inhalers (even only as needed), please bring them with you on the day of your procedure. Your physician has requested that you go to www.startemmi.com and enter the access code given to you at your visit today. This web site gives a general overview about your procedure. However, you should still follow specific instructions given to you by our office regarding your preparation for the procedure.  Thank you for choosing me and [facility].   Tye Savoy, NP

## 2016-09-27 NOTE — Progress Notes (Signed)
Reviewed and agree with management plan.  Nyeemah Jennette T. Talayia Hjort, MD FACG 

## 2016-10-12 ENCOUNTER — Other Ambulatory Visit: Payer: Self-pay | Admitting: *Deleted

## 2016-10-12 ENCOUNTER — Encounter: Payer: BLUE CROSS/BLUE SHIELD | Admitting: Internal Medicine

## 2016-10-23 ENCOUNTER — Encounter: Payer: Self-pay | Admitting: Gastroenterology

## 2016-10-24 ENCOUNTER — Telehealth: Payer: Self-pay | Admitting: Cardiology

## 2016-10-24 ENCOUNTER — Encounter: Payer: BLUE CROSS/BLUE SHIELD | Admitting: *Deleted

## 2016-10-24 NOTE — Telephone Encounter (Signed)
LMOVM reminding pt to send remote transmission.   

## 2016-10-26 ENCOUNTER — Encounter: Payer: BLUE CROSS/BLUE SHIELD | Admitting: Internal Medicine

## 2016-11-06 ENCOUNTER — Ambulatory Visit (AMBULATORY_SURGERY_CENTER): Payer: BLUE CROSS/BLUE SHIELD | Admitting: Gastroenterology

## 2016-11-06 ENCOUNTER — Encounter: Payer: Self-pay | Admitting: Gastroenterology

## 2016-11-06 VITALS — BP 126/87 | HR 70 | Temp 98.9°F | Resp 20 | Ht 70.0 in | Wt 229.0 lb

## 2016-11-06 DIAGNOSIS — D125 Benign neoplasm of sigmoid colon: Secondary | ICD-10-CM

## 2016-11-06 DIAGNOSIS — K635 Polyp of colon: Secondary | ICD-10-CM

## 2016-11-06 DIAGNOSIS — D128 Benign neoplasm of rectum: Secondary | ICD-10-CM

## 2016-11-06 DIAGNOSIS — Z1212 Encounter for screening for malignant neoplasm of rectum: Secondary | ICD-10-CM | POA: Diagnosis not present

## 2016-11-06 DIAGNOSIS — K621 Rectal polyp: Secondary | ICD-10-CM | POA: Diagnosis not present

## 2016-11-06 DIAGNOSIS — D129 Benign neoplasm of anus and anal canal: Secondary | ICD-10-CM

## 2016-11-06 DIAGNOSIS — D126 Benign neoplasm of colon, unspecified: Secondary | ICD-10-CM

## 2016-11-06 DIAGNOSIS — Z1211 Encounter for screening for malignant neoplasm of colon: Secondary | ICD-10-CM | POA: Diagnosis not present

## 2016-11-06 MED ORDER — SODIUM CHLORIDE 0.9 % IV SOLN: 500.0000 mL | INTRAVENOUS | Status: DC

## 2016-11-06 NOTE — Progress Notes (Signed)
Report to PACU, RN, vss, BBS= Clear.  

## 2016-11-06 NOTE — Patient Instructions (Signed)
Discharge instructions given. Handouts on polyps and diverticulosis. Resume previous medications. No ibuprofen, naproxen,aspirin or other NSAIDs for two weeks. YOU HAD AN ENDOSCOPIC PROCEDURE TODAY AT [facility]:   Refer to the procedure report that was given to you for any specific questions about what was found during the examination.  If the procedure report does not answer your questions, please call your gastroenterologist to clarify.  If you requested that your care partner not be given the details of your procedure findings, then the procedure report has been included in a sealed envelope for you to review at your convenience later.  YOU SHOULD EXPECT: Some feelings of bloating in the abdomen. Passage of more gas than usual.  Walking can help get rid of the air that was put into your GI tract during the procedure and reduce the bloating. If you had a lower endoscopy (such as a colonoscopy or flexible sigmoidoscopy) you may notice spotting of blood in your stool or on the toilet paper. If you underwent a bowel prep for your procedure, you may not have a normal bowel movement for a few days.  Please Note:  You might notice some irritation and congestion in your nose or some drainage.  This is from the oxygen used during your procedure.  There is no need for concern and it should clear up in a day or so.  SYMPTOMS TO REPORT IMMEDIATELY:   Following lower endoscopy (colonoscopy or flexible sigmoidoscopy):  Excessive amounts of blood in the stool  Significant tenderness or worsening of abdominal pains  Swelling of the abdomen that is new, acute  Fever of 100F or higher   For urgent or emergent issues, a gastroenterologist can be reached at any hour by calling [phone].   DIET:  We do recommend a small meal at first, but then you may proceed to your regular diet.  Drink plenty of fluids but you should avoid alcoholic beverages for 24 hours.  ACTIVITY:  You should  plan to take it easy for the rest of today and you should NOT DRIVE or use heavy machinery until tomorrow (because of the sedation medicines used during the test).    FOLLOW UP: Our staff will call the number listed on your records the next business day following your procedure to check on you and address any questions or concerns that you may have regarding the information given to you following your procedure. If we do not reach you, we will leave a message.  However, if you are feeling well and you are not experiencing any problems, there is no need to return our call.  We will assume that you have returned to your regular daily activities without incident.  If any biopsies were taken you will be contacted by phone or by letter within the next 1-3 weeks.  Please call us at [phone] if you have not heard about the biopsies in 3 weeks.    SIGNATURES/CONFIDENTIALITY: You and/or your care partner have signed paperwork which will be entered into your electronic medical record.  These signatures attest to the fact that that the information above on your After Visit Summary has been reviewed and is understood.  Full responsibility of the confidentiality of this discharge information lies with you and/or your care-partner.

## 2016-11-06 NOTE — Op Note (Signed)
[facility] Patient Name: Mike Bean Procedure Date: 11/06/2016 3:04 PM MRN: [id number] Endoscopist: Ladene Artist , MD Age: 56 Referring MD:  Date of Birth: 1961/03/30 Gender: Male Account #: [id number] Procedure:                Colonoscopy Indications:              Screening for colorectal malignant neoplasm Medicines:                Monitored Anesthesia Care Procedure:                Pre-Anesthesia Assessment:                           - Prior to the procedure, a History and Physical                            was performed, and patient medications and                            allergies were reviewed. The patient's tolerance of                            previous anesthesia was also reviewed. The risks                            and benefits of the procedure and the sedation                            options and risks were discussed with the patient.                            All questions were answered, and informed consent                            was obtained. Prior Anticoagulants: The patient has                            taken no previous anticoagulant or antiplatelet                            agents. ASA Grade Assessment: II - A patient with                            mild systemic disease. After reviewing the risks                            and benefits, the patient was deemed in                            satisfactory condition to undergo the procedure.                           After obtaining informed consent, the colonoscope  was passed under direct vision. Throughout the                            procedure, the patient's blood pressure, pulse, and                            oxygen saturations were monitored continuously. The                            Colonoscope was introduced through the anus and                            advanced to the the cecum, identified by                            appendiceal orifice and  ileocecal valve. The                            ileocecal valve, appendiceal orifice, and rectum                            were photographed. The quality of the bowel                            preparation was excellent. The colonoscopy was                            performed without difficulty. The patient tolerated                            the procedure well. Scope In: 3:10:01 PM Scope Out: 3:27:28 PM Scope Withdrawal Time: 0 hours 12 minutes 57 seconds  Total Procedure Duration: 0 hours 17 minutes 27 seconds  Findings:                 The perianal and digital rectal examinations were                            normal.                           Four sessile polyps were found in the rectum and                            sigmoid colon. The polyps were 6 to 8 mm in size.                            These polyps were removed with a cold snare.                            Resection and retrieval were complete.                           A single medium-mouthed diverticulum was found in  the cecum.                           The exam was otherwise without abnormality on                            direct and retroflexion views. Complications:            No immediate complications. Estimated blood loss:                            None. Estimated Blood Loss:     Estimated blood loss: none. Impression:               - Four 6 to 8 mm polyps in the rectum (2) and in                            the sigmoid colon (2), removed with a cold snare.                            Resected and retrieved.                           - Diverticulosis in the cecum.                           - The examination was otherwise normal on direct                            and retroflexion views. Recommendation:           - Repeat colonoscopy in 3 - 5 years for                            surveillance pending pathology review.                           - Start Eliquis (apixaban) no sooner than 5  days                            per Cardiology instructions. Refer to managing                            physician for further adjustment of therapy.                           - Patient has a contact number available for                            emergencies. The signs and symptoms of potential                            delayed complications were discussed with the                            patient. Return to normal activities tomorrow.  Written discharge instructions were provided to the                            patient.                           - Resume previous diet.                           - Continue present medications.                           - Await pathology results.                           - No aspirin, ibuprofen, naproxen, or other                            non-steroidal anti-inflammatory drugs for 2 weeks                            after polyp removal. Ladene Artist, MD 11/06/2016 3:32:24 PM This report has been signed electronically.

## 2016-11-06 NOTE — Progress Notes (Signed)
Called to room to assist during endoscopic procedure.  Patient ID and intended procedure confirmed with present staff. Received instructions for my participation in the procedure from the performing physician.  

## 2016-11-07 ENCOUNTER — Telehealth: Payer: Self-pay | Admitting: *Deleted

## 2016-11-07 NOTE — Telephone Encounter (Signed)
  Follow up Call-  Call back number 11/06/2016  Post procedure Call Back phone  # [phone]  Permission to leave phone message Yes  Some recent data might be hidden     Patient questions:  Do you have a fever, pain , or abdominal swelling? No. Pain Score  0 *  Have you tolerated food without any problems? Yes.    Have you been able to return to your normal activities? Yes.    Do you have any questions about your discharge instructions: Diet   No. Medications  No. Follow up visit  No.  Do you have questions or concerns about your Care? No.  Actions: * If pain score is 4 or above: No action needed, pain <4.

## 2016-11-13 ENCOUNTER — Encounter: Payer: Self-pay | Admitting: Gastroenterology

## 2017-07-04 ENCOUNTER — Ambulatory Visit (INDEPENDENT_AMBULATORY_CARE_PROVIDER_SITE_OTHER): Payer: PRIVATE HEALTH INSURANCE | Admitting: Internal Medicine

## 2017-07-04 ENCOUNTER — Encounter (INDEPENDENT_AMBULATORY_CARE_PROVIDER_SITE_OTHER): Payer: Self-pay

## 2017-07-04 ENCOUNTER — Encounter: Payer: Self-pay | Admitting: Internal Medicine

## 2017-07-04 ENCOUNTER — Telehealth: Payer: Self-pay | Admitting: Internal Medicine

## 2017-07-04 VITALS — BP 130/76 | HR 75 | Ht 70.0 in | Wt 218.0 lb

## 2017-07-04 DIAGNOSIS — D649 Anemia, unspecified: Secondary | ICD-10-CM

## 2017-07-04 DIAGNOSIS — I519 Heart disease, unspecified: Secondary | ICD-10-CM | POA: Diagnosis not present

## 2017-07-04 DIAGNOSIS — I426 Alcoholic cardiomyopathy: Secondary | ICD-10-CM

## 2017-07-04 DIAGNOSIS — R053 Chronic cough: Secondary | ICD-10-CM

## 2017-07-04 DIAGNOSIS — Z950 Presence of cardiac pacemaker: Secondary | ICD-10-CM

## 2017-07-04 DIAGNOSIS — R0602 Shortness of breath: Secondary | ICD-10-CM

## 2017-07-04 DIAGNOSIS — R05 Cough: Secondary | ICD-10-CM

## 2017-07-04 LAB — CUP PACEART INCLINIC DEVICE CHECK
Battery Voltage: 2.86 V
Implantable Lead Implant Date: 20120228
Implantable Lead Implant Date: 20120228
Implantable Lead Model: 4196
Lead Channel Impedance Value: 462.5 Ohm
Lead Channel Impedance Value: 725 Ohm
Lead Channel Pacing Threshold Amplitude: 0.75 V
Lead Channel Pacing Threshold Amplitude: 1.75 V
Lead Channel Sensing Intrinsic Amplitude: 12 mV
Lead Channel Setting Pacing Pulse Width: 0.5 ms
Lead Channel Setting Sensing Sensitivity: 2 mV
MDC IDC LEAD IMPLANT DT: 20120228
MDC IDC LEAD LOCATION: 753858
MDC IDC LEAD LOCATION: 753859
MDC IDC LEAD LOCATION: 753860
MDC IDC MSMT BATTERY REMAINING LONGEVITY: 36 mo
MDC IDC MSMT LEADCHNL LV PACING THRESHOLD PULSEWIDTH: 0.5 ms
MDC IDC MSMT LEADCHNL RV PACING THRESHOLD PULSEWIDTH: 0.5 ms
MDC IDC PG IMPLANT DT: 20120228
MDC IDC PG SERIAL: 2485643
MDC IDC SESS DTM: 20190307180210
MDC IDC SET LEADCHNL LV PACING AMPLITUDE: 2.75 V
MDC IDC SET LEADCHNL LV PACING PULSEWIDTH: 0.5 ms
MDC IDC SET LEADCHNL RV PACING AMPLITUDE: 2 V

## 2017-07-04 MED ORDER — APIXABAN 5 MG PO TABS: 5.0000 mg | ORAL_TABLET | Freq: Two times a day (BID) | ORAL | 11 refills | Status: DC

## 2017-07-04 NOTE — Patient Instructions (Addendum)
Medication Instructions:  Your physician recommends that you continue on your current medications as directed. Please refer to the Current Medication list given to you today.  Labwork: You will get lab work today:  BMP and CBC  Testing/Procedures: You will get a 2 view chest xray.  [address], [address] [place], [place] [address].  Follow-Up: Your physician wants you to follow-up in: one year with Dr. Lovena Le.   You will receive a reminder letter in the mail two months in advance. If you don't receive a letter, please call our office to schedule the follow-up appointment.  Remote monitoring is used to monitor your ICD from home. This monitoring reduces the number of office visits required to check your device to one time per year. It allows [place] to keep an eye on the functioning of your device to ensure it is working properly. You are scheduled for a device check from home on 10/03/2017. You may send your transmission at any time that day. If you have a wireless device, the transmission will be sent automatically. After your physician reviews your transmission, you will receive a postcard with your next transmission date.  Any Other Special Instructions Will Be Listed Below (If Applicable).  If you need a refill on your cardiac medications before your next appointment, please call your pharmacy.

## 2017-07-04 NOTE — Telephone Encounter (Signed)
New message     Pt c/o Shortness Of Breath: STAT if SOB developed within the last 24 hours or pt is noticeably SOB on the phone  1. Are you currently SOB (can you hear that pt is SOB on the phone)? no  2. How long have you been experiencing SOB? A couple months   3. Are you SOB when sitting or when up moving around? With exertion   4. Are you currently experiencing any other symptoms? A lot of congestion , coughing , patient would like to know how to proceed , this started a little while after he had his device installed

## 2017-07-04 NOTE — Telephone Encounter (Signed)
Call returned to Pt.  Pt has not been seen in a year.  Spoke with Pt, Pt should be seen by Dr. Lovena Le.  He is not on any blood pressure or fluid pills.  Pt states can come in this afternoon.  Will make appt with Dr. Lovena Le for 2:45 pm.

## 2017-07-04 NOTE — Progress Notes (Signed)
HPI Mike Bean returns today for on unscheduled visit for cough and feeling fatigued. He is an otherwise well 57 yo man with uncontrolled atrial fib who underwent biv pPM and AV node ablation several years ago. In the interim, he has done well until a couple of months ago when he began to have generalized fatigue. He has had a cough with productive white sputum, and some very mild amount of blood when he blows his nose. His energy level has been down for a couple of months. No edema. No syncope. No Known Allergies   Current Outpatient Medications  Medication Sig Dispense Refill  . apixaban (ELIQUIS) 5 MG TABS tablet Take 1 tablet (5 mg total) by mouth 2 (two) times daily. DO NOT START UNTIL AFTER COLONOSCOPY 60 tablet 11  . aspirin 81 MG tablet Take 81 mg by mouth daily. Reported on 09/14/2015     Current Facility-Administered Medications  Medication Dose Route Frequency Provider Last Rate Last Dose  . 0.9 %  sodium chloride infusion  500 mL Intravenous Continuous Ladene Artist, MD         Past Medical History:  Diagnosis Date  . CHF (congestive heart failure) ([place])   . History of alcohol abuse   . Hyperlipidemia   . Nonischemic cardiomyopathy ([place])    Presumed to be tachycardia mediated  . Pacemaker 2012  . Persistent atrial fibrillation ([place]) 2012  . Snores     ROS:   All systems reviewed and negative except as noted in the HPI.   Past Surgical History:  Procedure Laterality Date  . APPENDECTOMY     S/P  . CARDIAC CATHETERIZATION  11/2009   Normal coronary arteries   . PACEMAKER INSERTION  2012   st jude     Family History  Problem Relation Age of Onset  . Heart attack Father 40       MI  . Colon cancer Neg Hx      Social History   Socioeconomic History  . Marital status: Divorced    Spouse name: Not on file  . Number of children: 2  . Years of education: Not on file  . Highest education level: Not on file  Social Needs  . Financial resource  strain: Not on file  . Food insecurity - worry: Not on file  . Food insecurity - inability: Not on file  . Transportation needs - medical: Not on file  . Transportation needs - non-medical: Not on file  Occupational History  . 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 Smoking status: Current Every Day Smoker    Packs/day: 0.25    Types: Cigarettes  . Smokeless tobacco: Never Used  Substance and Sexual Activity  . Alcohol use: Yes    Alcohol/week: 12.6 oz    Types: 21 Standard drinks or equivalent per week    Comment: At least 3 beers a day  . Drug use: No  . Sexual activity: Not on file  Other Topics Concern  . Not on file  Social History Narrative   Lives in [place]   Separated   2 children, no grandchildren     BP 130/76   Pulse 75   Ht 5\' 10"  (1.778 m)   Wt 218 lb (98.9 kg)   BMI 31.28 kg/m   Physical Exam:  Well appearing 57 yo man, NAD HEENT: Unremarkable Neck:  6 cm JVD, no thyromegally Lymphatics:  No  adenopathy Back:  No CVA tenderness Lungs:  Clear with no wheezes HEART:  Regular rate rhythm, no murmurs, no rubs, no clicks Abd:  soft, positive bowel sounds, no organomegally, no rebound, no guarding Ext:  2 plus pulses, no edema, no cyanosis, no clubbing Skin:  No rashes no nodules Neuro:  CN II through XII intact, motor grossly intact  EKG - atrial fib with ventricular pacing  DEVICE  Normal device function.  See PaceArt for details.   Assess/Plan: 1. Atrial fib - his ventricular rate is well controlled. He will continue his current meds. 2. Generalized fatigue associated with a cough - I have asked him to undergo a CXR. He will have labs to be sure he is not anemic and that his renal function is ok.  3. Tobacco abuse - he is still smoking a ppd. I have encouraged him to stop smoking 4. Cough - the etiology is unclear. Will check a CXR.  Mike Bean.D.

## 2017-07-05 ENCOUNTER — Ambulatory Visit
Admission: RE | Admit: 2017-07-05 | Discharge: 2017-07-05 | Disposition: A | Discharge status: Home / Self Care | Payer: PRIVATE HEALTH INSURANCE | Source: Ambulatory Visit | Attending: Internal Medicine | Admitting: Internal Medicine

## 2017-07-05 DIAGNOSIS — R053 Chronic cough: Secondary | ICD-10-CM

## 2017-07-05 DIAGNOSIS — R05 Cough: Secondary | ICD-10-CM

## 2017-07-05 LAB — CBC WITH DIFFERENTIAL/PLATELET
BASOS ABS: 0.1 10*3/uL (ref 0.0–0.2)
Basos: 0 %
EOS (ABSOLUTE): 0.3 10*3/uL (ref 0.0–0.4)
Eos: 3 %
Hematocrit: 47.6 % (ref 37.5–51.0)
Hemoglobin: 16.5 g/dL (ref 13.0–17.7)
Immature Grans (Abs): 0.1 10*3/uL (ref 0.0–0.1)
Immature Granulocytes: 1 %
Lymphocytes Absolute: 3.3 10*3/uL — ABNORMAL HIGH (ref 0.7–3.1)
Lymphs: 27 %
MCH: 31.8 pg (ref 26.6–33.0)
MCHC: 34.7 g/dL (ref 31.5–35.7)
MCV: 92 fL (ref 79–97)
MONOCYTES: 10 %
Monocytes Absolute: 1.1 10*3/uL — ABNORMAL HIGH (ref 0.1–0.9)
NEUTROS ABS: 7.2 10*3/uL — AB (ref 1.4–7.0)
Neutrophils: 59 %
Platelets: 178 10*3/uL (ref 150–379)
RBC: 5.19 x10E6/uL (ref 4.14–5.80)
RDW: 13.2 % (ref 12.3–15.4)
WBC: 12.1 10*3/uL — ABNORMAL HIGH (ref 3.4–10.8)

## 2017-07-05 LAB — BASIC METABOLIC PANEL
BUN / CREAT RATIO: 10 (ref 9–20)
BUN: 11 mg/dL (ref 6–24)
CALCIUM: 9 mg/dL (ref 8.7–10.2)
CO2: 29 mmol/L (ref 20–29)
CREATININE: 1.11 mg/dL (ref 0.76–1.27)
Chloride: 99 mmol/L (ref 96–106)
GFR calc Af Amer: 84 mL/min/{1.73_m2} (ref >59)
GFR calc non Af Amer: 72 mL/min/{1.73_m2} (ref >59)
GLUCOSE: 64 mg/dL — AB (ref 65–99)
Potassium: 4.6 mmol/L (ref 3.5–5.2)
Sodium: 139 mmol/L (ref 134–144)

## 2017-07-08 ENCOUNTER — Telehealth: Payer: Self-pay | Admitting: Internal Medicine

## 2017-07-08 NOTE — Telephone Encounter (Signed)
New Message:   Pt calling to get results for labs.

## 2017-10-03 ENCOUNTER — Telehealth: Payer: Self-pay | Admitting: Cardiology

## 2017-10-03 ENCOUNTER — Ambulatory Visit (INDEPENDENT_AMBULATORY_CARE_PROVIDER_SITE_OTHER): Payer: PRIVATE HEALTH INSURANCE | Admitting: *Deleted

## 2017-10-03 DIAGNOSIS — I429 Cardiomyopathy, unspecified: Secondary | ICD-10-CM | POA: Diagnosis not present

## 2017-10-03 NOTE — Telephone Encounter (Signed)
LMOVM reminding pt to send remote transmission.   

## 2017-10-04 ENCOUNTER — Encounter: Payer: Self-pay | Admitting: Cardiology

## 2017-10-04 NOTE — Progress Notes (Signed)
Remote pacemaker transmission.   

## 2017-10-25 ENCOUNTER — Encounter (HOSPITAL_COMMUNITY): Payer: Self-pay | Admitting: Emergency Medicine

## 2017-10-25 ENCOUNTER — Emergency Department (HOSPITAL_COMMUNITY)
Admission: EM | Admit: 2017-10-25 | Discharge: 2017-10-26 | Disposition: A | Discharge status: Home / Self Care | Payer: PRIVATE HEALTH INSURANCE | Attending: Emergency Medicine | Admitting: Emergency Medicine

## 2017-10-25 ENCOUNTER — Other Ambulatory Visit: Payer: Self-pay

## 2017-10-25 ENCOUNTER — Emergency Department (HOSPITAL_COMMUNITY): Payer: PRIVATE HEALTH INSURANCE

## 2017-10-25 DIAGNOSIS — J449 Chronic obstructive pulmonary disease, unspecified: Secondary | ICD-10-CM | POA: Diagnosis not present

## 2017-10-25 DIAGNOSIS — I509 Heart failure, unspecified: Secondary | ICD-10-CM | POA: Diagnosis not present

## 2017-10-25 DIAGNOSIS — Z950 Presence of cardiac pacemaker: Secondary | ICD-10-CM | POA: Insufficient documentation

## 2017-10-25 DIAGNOSIS — R079 Chest pain, unspecified: Secondary | ICD-10-CM | POA: Insufficient documentation

## 2017-10-25 DIAGNOSIS — R062 Wheezing: Secondary | ICD-10-CM

## 2017-10-25 DIAGNOSIS — F17210 Nicotine dependence, cigarettes, uncomplicated: Secondary | ICD-10-CM | POA: Diagnosis not present

## 2017-10-25 LAB — CBC
HCT: 48.5 % (ref 39.0–52.0)
HEMOGLOBIN: 16.2 g/dL (ref 13.0–17.0)
MCH: 31.3 pg (ref 26.0–34.0)
MCHC: 33.4 g/dL (ref 30.0–36.0)
MCV: 93.8 fL (ref 78.0–100.0)
Platelets: 178 10*3/uL (ref 150–400)
RBC: 5.17 MIL/uL (ref 4.22–5.81)
RDW: 12.8 % (ref 11.5–15.5)
WBC: 12.6 10*3/uL — ABNORMAL HIGH (ref 4.0–10.5)

## 2017-10-25 LAB — I-STAT TROPONIN, ED: TROPONIN I, POC: 0 ng/mL (ref 0.00–0.08)

## 2017-10-25 LAB — BASIC METABOLIC PANEL
ANION GAP: 11 (ref 5–15)
BUN: 9 mg/dL (ref 6–20)
CO2: 23 mmol/L (ref 22–32)
Calcium: 8.8 mg/dL — ABNORMAL LOW (ref 8.9–10.3)
Chloride: 101 mmol/L (ref 98–111)
Creatinine, Ser: 0.98 mg/dL (ref 0.61–1.24)
GFR calc non Af Amer: >60 mL/min (ref >60)
GLUCOSE: 86 mg/dL (ref 70–99)
Potassium: 3.6 mmol/L (ref 3.5–5.1)
Sodium: 135 mmol/L (ref 135–145)

## 2017-10-25 MED ORDER — ALBUTEROL SULFATE (2.5 MG/3ML) 0.083% IN NEBU
5.0000 mg | INHALATION_SOLUTION | Freq: Once | RESPIRATORY_TRACT | Status: AC
  Administered 2017-10-25: 5 mg via RESPIRATORY_TRACT
  Filled 2017-10-25: qty 6

## 2017-10-25 NOTE — ED Triage Notes (Signed)
Pt reports chest pain "symptoms of afib" for 3 days, feels better today but figures he should get checked out. Pacemaker in place, spikes on ekg.

## 2017-10-26 LAB — I-STAT TROPONIN, ED: TROPONIN I, POC: 0.02 ng/mL (ref 0.00–0.08)

## 2017-10-26 MED ORDER — IPRATROPIUM BROMIDE 0.02 % IN SOLN
0.5000 mg | Freq: Once | RESPIRATORY_TRACT | Status: AC
  Administered 2017-10-26: 0.5 mg via RESPIRATORY_TRACT
  Filled 2017-10-26: qty 2.5

## 2017-10-26 MED ORDER — ALBUTEROL SULFATE (2.5 MG/3ML) 0.083% IN NEBU
5.0000 mg | INHALATION_SOLUTION | Freq: Once | RESPIRATORY_TRACT | Status: AC
  Administered 2017-10-26: 5 mg via RESPIRATORY_TRACT
  Filled 2017-10-26: qty 6

## 2017-10-26 MED ORDER — ALBUTEROL SULFATE HFA 108 (90 BASE) MCG/ACT IN AERS
2.0000 | INHALATION_SPRAY | Freq: Once | RESPIRATORY_TRACT | Status: AC
  Administered 2017-10-26: 2 via RESPIRATORY_TRACT
  Filled 2017-10-26: qty 6.7

## 2017-10-26 MED ORDER — PREDNISONE 20 MG PO TABS
60.0000 mg | ORAL_TABLET | Freq: Once | ORAL | Status: AC
  Administered 2017-10-26: 60 mg via ORAL
  Filled 2017-10-26: qty 3

## 2017-10-26 MED ORDER — PREDNISONE 20 MG PO TABS: ORAL_TABLET | ORAL | 0 refills | Status: DC

## 2017-10-26 NOTE — ED Notes (Signed)
Daughter's Cell: [phone].

## 2017-10-26 NOTE — ED Notes (Signed)
Family member at the desk requesting patient be reassessed.  VSS at this time.  Lungs noted to have wheezing throughout all lobes.  Taken to A5 for a breathing treatment at this time.

## 2017-10-26 NOTE — Discharge Instructions (Signed)
Take the prescribed medication as directed.  Use inhaler at home to help with wheezing. Follow-up with your primary care doctor.  You may also want to follow-up with Dr. Lovena Le if you have ongoing issues or concerns about your pacemaker. Return to the ED for new or worsening symptoms.

## 2017-10-26 NOTE — ED Notes (Signed)
Mike Bean, [employer] Representative called and stated pt's pacemaker is functioning normal. EDP notified.

## 2017-10-26 NOTE — ED Provider Notes (Signed)
[facility] EMERGENCY DEPARTMENT Provider Note   CSN: [id number] Arrival date & time: 10/25/17  2141     History   Chief Complaint Chief Complaint  Patient presents with  . Chest Pain    HPI Mike Bean is a 57 y.o. male.  The history is provided by the patient and medical records.  Chest Pain   Associated symptoms include shortness of breath.   57 y.o. F with hxof alcohol abuse, HLP, NICM, AFIB s/p pacemaker placement, presenting to the ED for chest pain.  Patient states he has not been feeling well for about 3 days now.  He has been having some chest pressure, cough, SOB, fatigue, and low energy.  States sometimes when he feels this way his kidney function is off.  He denies fever/chills/sweats. No nausea/vomiting.  No diaphoresis, focal numbness/weakness, confusion, changes in speech, trouble walking or falls.  He does take eliquis for his AFIB.  No recent falls or trauma.  He was given breathing treatment in triage and states it helped a lot.  States he did start feeling better today but still wanted to get checked out.  He denies active chest pain at time of my evaluation.    Past Medical History:  Diagnosis Date  . CHF (congestive heart failure) ([place])   . History of alcohol abuse   . Hyperlipidemia   . Nonischemic cardiomyopathy ([place])    Presumed to be tachycardia mediated  . Pacemaker 2012  . Persistent atrial fibrillation ([place]) 2012  . Snores     Patient Active Problem List   Diagnosis Date Noted  . ALCOHOLIC CARDIOMYOPATHY 14/97/0263  . SNORING 04/19/2010  . ATRIAL FIBRILLATION 04/18/2010  . CHF 04/18/2010  . COPD 04/18/2010    Past Surgical History:  Procedure Laterality Date  . APPENDECTOMY     S/P  . CARDIAC CATHETERIZATION  11/2009   Normal coronary arteries   . PACEMAKER INSERTION  2012   st jude        Home Medications    Prior to Admission medications   Medication Sig Start Date End Date Taking? Authorizing Provider    apixaban (ELIQUIS) 5 MG TABS tablet Take 1 tablet (5 mg total) by mouth 2 (two) times daily. DO NOT START UNTIL AFTER COLONOSCOPY 07/04/17   Evans Lance, MD  aspirin 81 MG tablet Take 81 mg by mouth daily. Reported on 09/14/2015    [provider]    Family History Family History  Problem Relation Age of Onset  . Heart attack Father 36       MI  . Colon cancer Neg Hx     Social History Social History   Tobacco Use  . Smoking status: Current Every Day Smoker    Packs/day: 0.25    Types: Cigarettes  . Smokeless tobacco: Never Used  Substance Use Topics  . Alcohol use: Yes    Alcohol/week: 12.6 oz    Types: 21 Standard drinks or equivalent per week    Comment: At least 3 beers a day  . Drug use: No     Allergies   Patient has no known allergies.   Review of Systems Review of Systems  Constitutional: Positive for fatigue.  Respiratory: Positive for shortness of breath and wheezing.   Cardiovascular: Positive for chest pain.  All other systems reviewed and are negative.    Physical Exam Updated Vital Signs BP (!) 149/103   Pulse 70   Temp 97.8 F (36.6 C) (Oral)  Resp (!) 25   Ht 5\' 10"  (1.778 m)   Wt 96.2 kg (212 lb)   SpO2 95%   BMI 30.42 kg/m   Physical Exam  Constitutional: He is oriented to person, place, and time. He appears well-developed and well-nourished.  HENT:  Head: Normocephalic and atraumatic.  Mouth/Throat: Oropharynx is clear and moist.  Eyes: Pupils are equal, round, and reactive to light. Conjunctivae and EOM are normal.  Neck: Normal range of motion.  Cardiovascular: Normal rate, regular rhythm and normal heart sounds.  Pulmonary/Chest: Effort normal. He has wheezes.  Pacemaker left chest wall, no signs of infection Diffuse expiratory wheezes throughout, NAD, speaking in full sentences without difficulty  Abdominal: Soft. Bowel sounds are normal.  Musculoskeletal: Normal range of motion.  No peripheral edema   Neurological: He is alert and oriented to person, place, and time.  Skin: Skin is warm and dry.  Psychiatric: He has a normal mood and affect.  Nursing note and vitals reviewed.    ED Treatments / Results  Labs (all labs ordered are listed, but only abnormal results are displayed) Labs Reviewed  BASIC METABOLIC PANEL - Abnormal; Notable for the following components:      Result Value   Calcium 8.8 (*)    All other components within normal limits  CBC - Abnormal; Notable for the following components:   WBC 12.6 (*)    All other components within normal limits  I-STAT TROPONIN, ED  I-STAT TROPONIN, ED    EKG None  Radiology Dg Chest 2 View  Result Date: 10/25/2017 CLINICAL DATA:  Chest pain EXAM: CHEST - 2 VIEW COMPARISON:  07/05/2017 chest radiograph. FINDINGS: Stable configuration of 3 lead left subclavian pacemaker. Stable cardiomediastinal silhouette with normal heart size. No pneumothorax. No pleural effusion. Hyperinflated lungs. No pulmonary edema. No acute consolidative airspace disease. IMPRESSION: Hyperinflated lungs, cannot exclude COPD. Otherwise no active cardiopulmonary disease. Electronically Signed   By: Ilona Sorrel M.D.   On: 10/25/2017 22:24    Procedures Procedures (including critical care time)  Medications Ordered in ED Medications  albuterol (PROVENTIL) (2.5 MG/3ML) 0.083% nebulizer solution 5 mg (has no administration in time range)  ipratropium (ATROVENT) nebulizer solution 0.5 mg (has no administration in time range)  predniSONE (DELTASONE) tablet 60 mg (has no administration in time range)  albuterol (PROVENTIL) (2.5 MG/3ML) 0.083% nebulizer solution 5 mg (5 mg Nebulization Given 10/25/17 2332)     Initial Impression / Assessment and Plan / ED Course  I have reviewed the triage vital signs and the nursing notes.  Pertinent labs & imaging results that were available during my care of the patient were reviewed by me and considered in my medical  decision making (see chart for details).  57 year old male presenting to the ED with chest pain, shortness of breath, cough, and fatigue for a few days.  States he actually started feeling better today but still wanted to be checked out.  He is concerned that he may have some abnormal electrolytes or something wrong with his pacemaker.  He is followed by Dr. Crissie Sickles.  Patient is afebrile, nontoxic.  Vital signs are stable on room air.  He does have some fairly significant expiratory wheezing but is in no acute distress.  He is still able to talk in full sentences without difficulty.  Screening labs were sent from triage and are overall reassuring.  Troponin is negative.  Chest x-ray with changes of COPD but no superimposed cardiopulmonary process.  Patient denies any active  chest pain here in the ED.  He does have some cardiac risk factors so will obtain delta troponin.  Pacemaker to be interrogated.  Also given second round of nebs and dose of prednisone.  Reassess.  4:08 AM Patient resting comfortably.  Vital signs remained stable on room air.  Pacemaker interrogation without any acute abnormalities.  Delta trop negative as well. Results discussed with patient, he feels reassured.    States he is feeling somewhat better after neb treatments.  Continues to have some mild expiratory wheezing, suspect this is likely chronic given his history of COPD and continued smoking.  Does not have any home medications to use.  He was given albuterol inhaler here, will discharge home with prednisone taper.  He was encouraged to follow-up closely with his primary care doctor.  Can also follow-up with his cardiologist.  Discussed plan with patient, he acknowledged understanding and agreed with plan of care.  Return precautions given for new or worsening symptoms.  Final Clinical Impressions(s) / ED Diagnoses   Final diagnoses:  Chest pain in adult  Wheezing    ED Discharge Orders        Ordered    predniSONE  (DELTASONE) 20 MG tablet     10/26/17 0430       Larene Pickett, PA-C 10/26/17 [place], Ankit, MD 10/27/17 [phone]

## 2017-10-28 ENCOUNTER — Telehealth: Payer: Self-pay | Admitting: Internal Medicine

## 2017-10-28 ENCOUNTER — Telehealth: Payer: Self-pay | Admitting: *Deleted

## 2017-10-28 NOTE — Telephone Encounter (Signed)
Pharmacy called related to Rx: Prednisone.  Pt took burst dose (4 tablets each day for 3 days) instead of taper .Marland KitchenMarland KitchenEDCM relayed information to EDP (Tegeler) who asks about condition of pt (pt felt much better and looked physically well according to Pharm D) before confirming modified dose was ok.  No further EDCM needs identified at this time.

## 2017-10-28 NOTE — Telephone Encounter (Signed)
New Message   Patient is calling because he went to the hospital over the weekend and the prescribed him some prednisone. Patient is wanting to request a stress test if all possible. Please call to discuss.

## 2017-10-28 NOTE — Telephone Encounter (Signed)
Scheduled Pt for 11/13/2017 at 3:15 pm with Dr. Lovena Le.

## 2017-10-30 ENCOUNTER — Encounter: Payer: PRIVATE HEALTH INSURANCE | Admitting: Internal Medicine

## 2017-11-13 ENCOUNTER — Ambulatory Visit: Payer: PRIVATE HEALTH INSURANCE | Admitting: Internal Medicine

## 2017-11-13 ENCOUNTER — Encounter: Payer: Self-pay | Admitting: Internal Medicine

## 2017-11-13 VITALS — BP 124/82 | HR 96 | Ht 70.0 in | Wt 212.0 lb

## 2017-11-13 DIAGNOSIS — I4811 Longstanding persistent atrial fibrillation: Secondary | ICD-10-CM

## 2017-11-13 DIAGNOSIS — Z950 Presence of cardiac pacemaker: Secondary | ICD-10-CM | POA: Diagnosis not present

## 2017-11-13 DIAGNOSIS — R05 Cough: Secondary | ICD-10-CM | POA: Diagnosis not present

## 2017-11-13 DIAGNOSIS — I519 Heart disease, unspecified: Secondary | ICD-10-CM

## 2017-11-13 DIAGNOSIS — R0989 Other specified symptoms and signs involving the circulatory and respiratory systems: Secondary | ICD-10-CM

## 2017-11-13 DIAGNOSIS — I481 Persistent atrial fibrillation: Secondary | ICD-10-CM | POA: Diagnosis not present

## 2017-11-13 DIAGNOSIS — R059 Cough, unspecified: Secondary | ICD-10-CM

## 2017-11-13 DIAGNOSIS — R0602 Shortness of breath: Secondary | ICD-10-CM

## 2017-11-13 LAB — CUP PACEART INCLINIC DEVICE CHECK
Battery Remaining Longevity: 37 mo
Implantable Lead Implant Date: 20120228
Implantable Lead Implant Date: 20120228
Implantable Lead Location: 753858
Implantable Lead Location: 753859
Implantable Lead Location: 753860
Implantable Pulse Generator Implant Date: 20120228
Lead Channel Impedance Value: 762.5 Ohm
Lead Channel Pacing Threshold Amplitude: 0.875 V
Lead Channel Pacing Threshold Pulse Width: 0.5 ms
Lead Channel Pacing Threshold Pulse Width: 0.5 ms
Lead Channel Sensing Intrinsic Amplitude: 12 mV
Lead Channel Setting Pacing Amplitude: 2.25 V
Lead Channel Setting Pacing Pulse Width: 0.5 ms
Lead Channel Setting Sensing Sensitivity: 2 mV
MDC IDC LEAD IMPLANT DT: 20120228
MDC IDC MSMT BATTERY VOLTAGE: 2.84 V
MDC IDC MSMT LEADCHNL LV PACING THRESHOLD AMPLITUDE: 1.25 V
MDC IDC MSMT LEADCHNL RV IMPEDANCE VALUE: 437.5 Ohm
MDC IDC SESS DTM: 20190717170133
MDC IDC SET LEADCHNL LV PACING PULSEWIDTH: 0.5 ms
MDC IDC SET LEADCHNL RV PACING AMPLITUDE: 2 V
Pulse Gen Serial Number: 2485643

## 2017-11-13 NOTE — Patient Instructions (Addendum)
Medication Instructions:  Your physician recommends that you continue on your current medications as directed. Please refer to the Current Medication list given to you today.  Labwork: You will get lab work today:  TSH and T4.  Testing/Procedures: Your physician has requested that you have a lexiscan myoview. For further information please visit [email or web address]. Please follow instruction sheet, as given.  Please schedule for lexiscan myoview.  Follow-Up: Your physician wants you to follow-up in: one year with Dr. Lovena Le.   You will receive a reminder letter in the mail two months in advance. If you don't receive a letter, please call our office to schedule the follow-up appointment.  ---SOONER BASED ON RESULTS OF THE STRESS TEST  Remote monitoring is used to monitor your Pacemaker from home. This monitoring reduces the number of office visits required to check your device to one time per year. It allows [place] to keep an eye on the functioning of your device to ensure it is working properly. You are scheduled for a device check from home on 01/02/2018. You may send your transmission at any time that day. If you have a wireless device, the transmission will be sent automatically. After your physician reviews your transmission, you will receive a postcard with your next transmission date.  Any Other Special Instructions Will Be Listed Below (If Applicable).  Ambulatory referral to pulmonology.  Their office will call you.  If you need a refill on your cardiac medications before your next appointment, please call your pharmacy.

## 2017-11-13 NOTE — Progress Notes (Signed)
HPI [name] returns today for followup. I saw him last 4 months ago. He has a h/o atrial fib, s/p AVNode ablation and biv PPM insertion several years ago. He c/o feeling more fatigued and tired. He is still smoking. He has chronic congestion. He has not had an assessment of his LV function for several years. He denies anginal symptoms. No Known Allergies   Current Outpatient Medications  Medication Sig Dispense Refill  . apixaban (ELIQUIS) 5 MG TABS tablet Take 1 tablet (5 mg total) by mouth 2 (two) times daily. DO NOT START UNTIL AFTER COLONOSCOPY 60 tablet 11  . aspirin 81 MG tablet Take 81 mg by mouth daily as needed. Reported on 09/14/2015     Current Facility-Administered Medications  Medication Dose Route Frequency Provider Last Rate Last Dose  . 0.9 %  sodium chloride infusion  500 mL Intravenous Continuous Ladene Artist, MD         Past Medical History:  Diagnosis Date  . CHF (congestive heart failure) ([place])   . History of alcohol abuse   . Hyperlipidemia   . Nonischemic cardiomyopathy ([place])    Presumed to be tachycardia mediated  . Pacemaker 2012  . Persistent atrial fibrillation ([place]) 2012  . Snores     ROS:   All systems reviewed and negative except as noted in the HPI.   Past Surgical History:  Procedure Laterality Date  . APPENDECTOMY     S/P  . CARDIAC CATHETERIZATION  11/2009   Normal coronary arteries   . PACEMAKER INSERTION  2012   [employer] jude     Family History  Problem Relation Age of Onset  . Heart attack Father 52       MI  . Colon cancer Neg Hx      Social History   Socioeconomic History  . Marital status: Divorced    Spouse name: Not on file  . Number of children: 2  . Years of education: Not on file  . Highest education level: Not on file  Occupational History  . Occupation: Works in Scientist, clinical (histocompatibility and immunogenetics): [employer]  . Financial resource strain: Not on file  . Food insecurity:    Worry: Not on file      Inability: Not on file  . Transportation needs:    Medical: Not on file    Non-medical: Not on file  Tobacco Use  . Smoking status: Current Every Day Smoker    Packs/day: 0.25    Types: Cigarettes  . Smokeless tobacco: Never Used  Substance and Sexual Activity  . Alcohol use: Yes    Alcohol/week: 12.6 oz    Types: 21 Standard drinks or equivalent per week    Comment: At least 3 beers a day  . Drug use: No  . Sexual activity: Not on file  Lifestyle  . Physical activity:    Days per week: Not on file    Minutes per session: Not on file  . Stress: Not on file  Relationships  . Social connections:    Talks on phone: Not on file    Gets together: Not on file    Attends religious service: Not on file    Active member of club or organization: Not on file    Attends meetings of clubs or organizations: Not on file    Relationship status: Not on file  . Intimate partner violence:    Fear of current or ex  partner: Not on file    Emotionally abused: Not on file    Physically abused: Not on file    Forced sexual activity: Not on file  Other Topics Concern  . Not on file  Social History Narrative   Lives in [place]   Separated   2 children, no grandchildren     BP 124/82   Pulse 96   Ht 5\' 10"  (1.778 m)   Wt 212 lb (96.2 kg)   SpO2 96%   BMI 30.42 kg/m   Physical Exam:  Well appearing NAD HEENT: Unremarkable Neck:  No JVD, no thyromegally Lymphatics:  No adenopathy Back:  No CVA tenderness Lungs:  Clear HEART:  Regular rate rhythm, no murmurs, no rubs, no clicks Abd:  soft, positive bowel sounds, no organomegally, no rebound, no guarding Ext:  2 plus pulses, no edema, no cyanosis, no clubbing Skin:  No rashes no nodules Neuro:  CN II through XII intact, motor grossly intact  EKG - none  DEVICE  Normal device function.  See PaceArt for details.   Assess/Plan: 1. Atrial fib - his ventricular rate is controlled.  2. PPM - his [employer] anthem Biv PPM is  working normally. Will recheck in several months. 3. Fatigue and weakness - he will get a check of his TSH and free T4 and a stress myoview.  Mikle Bosworth.D.

## 2017-11-14 ENCOUNTER — Telehealth (HOSPITAL_COMMUNITY): Payer: Self-pay | Admitting: *Deleted

## 2017-11-14 LAB — TSH: TSH: 2.6 u[IU]/mL (ref 0.450–4.500)

## 2017-11-14 LAB — T4, FREE: Free T4: 1.14 ng/dL (ref 0.82–1.77)

## 2017-11-14 NOTE — Telephone Encounter (Signed)
Patient given detailed instructions per Myocardial Perfusion Study Information Sheet for the test on 11/19/17. Patient notified to arrive 15 minutes early and that it is imperative to arrive on time for appointment to keep from having the test rescheduled.  If you need to cancel or reschedule your appointment, please call the office within 24 hours of your appointment. . Patient verbalized understanding. Kirstie Peri, RN

## 2017-11-19 ENCOUNTER — Ambulatory Visit (HOSPITAL_COMMUNITY): Payer: PRIVATE HEALTH INSURANCE | Attending: Cardiovascular Disease

## 2017-11-19 DIAGNOSIS — R0602 Shortness of breath: Secondary | ICD-10-CM | POA: Diagnosis not present

## 2017-11-19 LAB — MYOCARDIAL PERFUSION IMAGING
CHL CUP RESTING HR STRESS: 88 {beats}/min
CSEPPHR: 101 {beats}/min
LV dias vol: 160 mL (ref 62–150)
LVSYSVOL: 94 mL
RATE: 0.4
SDS: 0
SRS: 2
SSS: 2
TID: 0.93

## 2017-11-19 MED ORDER — TECHNETIUM TC 99M TETROFOSMIN IV KIT
31.7000 | PACK | Freq: Once | INTRAVENOUS | Status: AC | PRN
  Administered 2017-11-19: 31.7 via INTRAVENOUS
  Filled 2017-11-19: qty 32

## 2017-11-19 MED ORDER — TECHNETIUM TC 99M TETROFOSMIN IV KIT
10.1000 | PACK | Freq: Once | INTRAVENOUS | Status: AC | PRN
  Administered 2017-11-19: 10.1 via INTRAVENOUS
  Filled 2017-11-19: qty 11

## 2017-11-19 MED ORDER — REGADENOSON 0.4 MG/5ML IV SOLN
0.4000 mg | Freq: Once | INTRAVENOUS | Status: AC
  Administered 2017-11-19: 0.4 mg via INTRAVENOUS

## 2017-11-26 LAB — CUP PACEART REMOTE DEVICE CHECK
Battery Remaining Longevity: 40 mo
Battery Remaining Percentage: 46 %
Battery Voltage: 2.86 V
Implantable Lead Implant Date: 20120228
Implantable Lead Implant Date: 20120228
Implantable Lead Location: 753858
Implantable Lead Location: 753860
Lead Channel Impedance Value: 830 Ohm
Lead Channel Pacing Threshold Amplitude: 1.625 V
Lead Channel Pacing Threshold Pulse Width: 0.5 ms
Lead Channel Sensing Intrinsic Amplitude: 12 mV
Lead Channel Setting Pacing Amplitude: 2 V
Lead Channel Setting Pacing Pulse Width: 0.5 ms
Lead Channel Setting Sensing Sensitivity: 2 mV
MDC IDC LEAD IMPLANT DT: 20120228
MDC IDC LEAD LOCATION: 753859
MDC IDC MSMT LEADCHNL RV IMPEDANCE VALUE: 540 Ohm
MDC IDC MSMT LEADCHNL RV PACING THRESHOLD AMPLITUDE: 0.75 V
MDC IDC MSMT LEADCHNL RV PACING THRESHOLD PULSEWIDTH: 0.5 ms
MDC IDC PG IMPLANT DT: 20120228
MDC IDC SESS DTM: 20190607044821
MDC IDC SET LEADCHNL LV PACING AMPLITUDE: 2.625
MDC IDC SET LEADCHNL LV PACING PULSEWIDTH: 0.5 ms
Pulse Gen Model: 3210
Pulse Gen Serial Number: 2485643

## 2017-11-27 ENCOUNTER — Other Ambulatory Visit: Payer: Self-pay

## 2017-11-27 DIAGNOSIS — R0602 Shortness of breath: Secondary | ICD-10-CM

## 2017-11-27 DIAGNOSIS — R9439 Abnormal result of other cardiovascular function study: Secondary | ICD-10-CM

## 2017-11-27 NOTE — Progress Notes (Signed)
Per Dr. Lovena Le- d/t Pt with abnormal stress test/shortness of breath order cardiac CT.

## 2018-01-02 ENCOUNTER — Ambulatory Visit (INDEPENDENT_AMBULATORY_CARE_PROVIDER_SITE_OTHER): Payer: PRIVATE HEALTH INSURANCE | Admitting: *Deleted

## 2018-01-02 DIAGNOSIS — I4811 Longstanding persistent atrial fibrillation: Secondary | ICD-10-CM

## 2018-01-02 DIAGNOSIS — I429 Cardiomyopathy, unspecified: Secondary | ICD-10-CM | POA: Diagnosis not present

## 2018-01-02 NOTE — Progress Notes (Signed)
Remote pacemaker transmission.   

## 2018-01-28 LAB — CUP PACEART REMOTE DEVICE CHECK
Battery Remaining Longevity: 41 mo
Battery Remaining Percentage: 40 %
Implantable Lead Implant Date: 20120228
Implantable Lead Implant Date: 20120228
Implantable Lead Model: 4196
Lead Channel Impedance Value: 460 Ohm
Lead Channel Impedance Value: 750 Ohm
Lead Channel Pacing Threshold Pulse Width: 0.5 ms
Lead Channel Setting Pacing Amplitude: 2.625
MDC IDC LEAD IMPLANT DT: 20120228
MDC IDC LEAD LOCATION: 753858
MDC IDC LEAD LOCATION: 753859
MDC IDC LEAD LOCATION: 753860
MDC IDC MSMT BATTERY VOLTAGE: 2.84 V
MDC IDC MSMT LEADCHNL LV PACING THRESHOLD AMPLITUDE: 1.625 V
MDC IDC MSMT LEADCHNL LV PACING THRESHOLD PULSEWIDTH: 0.5 ms
MDC IDC MSMT LEADCHNL RV PACING THRESHOLD AMPLITUDE: 0.75 V
MDC IDC MSMT LEADCHNL RV SENSING INTR AMPL: 12 mV
MDC IDC PG IMPLANT DT: 20120228
MDC IDC SESS DTM: 20190905071808
MDC IDC SET LEADCHNL LV PACING PULSEWIDTH: 0.5 ms
MDC IDC SET LEADCHNL RV PACING AMPLITUDE: 2 V
MDC IDC SET LEADCHNL RV PACING PULSEWIDTH: 0.5 ms
MDC IDC SET LEADCHNL RV SENSING SENSITIVITY: 2 mV
Pulse Gen Model: 3210
Pulse Gen Serial Number: 2485643

## 2018-02-14 ENCOUNTER — Ambulatory Visit (HOSPITAL_COMMUNITY): Payer: PRIVATE HEALTH INSURANCE

## 2018-04-03 ENCOUNTER — Telehealth: Payer: Self-pay

## 2018-04-03 ENCOUNTER — Ambulatory Visit (INDEPENDENT_AMBULATORY_CARE_PROVIDER_SITE_OTHER): Payer: PRIVATE HEALTH INSURANCE

## 2018-04-03 DIAGNOSIS — I4811 Longstanding persistent atrial fibrillation: Secondary | ICD-10-CM

## 2018-04-03 DIAGNOSIS — I429 Cardiomyopathy, unspecified: Secondary | ICD-10-CM

## 2018-04-03 NOTE — Telephone Encounter (Signed)
LMOVM reminding pt to send remote transmission.   

## 2018-04-04 NOTE — Progress Notes (Signed)
Remote pacemaker transmission.   

## 2018-04-08 ENCOUNTER — Encounter: Payer: Self-pay | Admitting: Cardiology

## 2018-05-24 LAB — CUP PACEART REMOTE DEVICE CHECK
Battery Remaining Longevity: 31 mo
Battery Voltage: 2.81 V
Date Time Interrogation Session: 20191206035049
Implantable Lead Implant Date: 20120228
Implantable Lead Location: 753859
Implantable Lead Model: 4196
Implantable Pulse Generator Implant Date: 20120228
Lead Channel Impedance Value: 480 Ohm
Lead Channel Pacing Threshold Amplitude: 0.875 V
Lead Channel Pacing Threshold Pulse Width: 0.5 ms
Lead Channel Sensing Intrinsic Amplitude: 12 mV
Lead Channel Setting Pacing Amplitude: 2 V
Lead Channel Setting Pacing Pulse Width: 0.5 ms
Lead Channel Setting Pacing Pulse Width: 0.5 ms
Lead Channel Setting Sensing Sensitivity: 2 mV
MDC IDC LEAD IMPLANT DT: 20120228
MDC IDC LEAD IMPLANT DT: 20120228
MDC IDC LEAD LOCATION: 753858
MDC IDC LEAD LOCATION: 753860
MDC IDC MSMT BATTERY REMAINING PERCENTAGE: 29 %
MDC IDC MSMT LEADCHNL LV IMPEDANCE VALUE: 750 Ohm
MDC IDC MSMT LEADCHNL LV PACING THRESHOLD AMPLITUDE: 1.5 V
MDC IDC MSMT LEADCHNL RV PACING THRESHOLD PULSEWIDTH: 0.5 ms
MDC IDC PG SERIAL: 2485643
MDC IDC SET LEADCHNL LV PACING AMPLITUDE: 2.5 V

## 2018-06-30 ENCOUNTER — Other Ambulatory Visit: Payer: Self-pay

## 2018-06-30 ENCOUNTER — Encounter (HOSPITAL_COMMUNITY): Payer: Self-pay | Admitting: Emergency Medicine

## 2018-06-30 ENCOUNTER — Emergency Department (HOSPITAL_COMMUNITY): Payer: PRIVATE HEALTH INSURANCE

## 2018-06-30 ENCOUNTER — Telehealth: Payer: Self-pay | Admitting: Internal Medicine

## 2018-06-30 ENCOUNTER — Telehealth (HOSPITAL_COMMUNITY): Payer: Self-pay | Admitting: *Deleted

## 2018-06-30 ENCOUNTER — Emergency Department (HOSPITAL_COMMUNITY)
Admission: EM | Admit: 2018-06-30 | Discharge: 2018-06-30 | Disposition: A | Discharge status: Home / Self Care | Payer: PRIVATE HEALTH INSURANCE | Attending: Emergency Medicine | Admitting: Emergency Medicine

## 2018-06-30 DIAGNOSIS — R05 Cough: Secondary | ICD-10-CM | POA: Insufficient documentation

## 2018-06-30 DIAGNOSIS — J441 Chronic obstructive pulmonary disease with (acute) exacerbation: Secondary | ICD-10-CM | POA: Diagnosis not present

## 2018-06-30 DIAGNOSIS — F17210 Nicotine dependence, cigarettes, uncomplicated: Secondary | ICD-10-CM | POA: Insufficient documentation

## 2018-06-30 DIAGNOSIS — Z7982 Long term (current) use of aspirin: Secondary | ICD-10-CM | POA: Insufficient documentation

## 2018-06-30 DIAGNOSIS — R0602 Shortness of breath: Secondary | ICD-10-CM | POA: Diagnosis present

## 2018-06-30 DIAGNOSIS — I4891 Unspecified atrial fibrillation: Secondary | ICD-10-CM | POA: Insufficient documentation

## 2018-06-30 DIAGNOSIS — Z7901 Long term (current) use of anticoagulants: Secondary | ICD-10-CM | POA: Insufficient documentation

## 2018-06-30 DIAGNOSIS — Z950 Presence of cardiac pacemaker: Secondary | ICD-10-CM | POA: Diagnosis not present

## 2018-06-30 DIAGNOSIS — I509 Heart failure, unspecified: Secondary | ICD-10-CM | POA: Insufficient documentation

## 2018-06-30 LAB — BASIC METABOLIC PANEL
ANION GAP: 9 (ref 5–15)
BUN: 10 mg/dL (ref 6–20)
CALCIUM: 8.7 mg/dL — AB (ref 8.9–10.3)
CO2: 23 mmol/L (ref 22–32)
CREATININE: 1.15 mg/dL (ref 0.61–1.24)
Chloride: 102 mmol/L (ref 98–111)
GFR calc non Af Amer: >60 mL/min (ref >60)
Glucose, Bld: 129 mg/dL — ABNORMAL HIGH (ref 70–99)
Potassium: 3.8 mmol/L (ref 3.5–5.1)
SODIUM: 134 mmol/L — AB (ref 135–145)

## 2018-06-30 LAB — CBC WITH DIFFERENTIAL/PLATELET
Abs Immature Granulocytes: 0.08 10*3/uL — ABNORMAL HIGH (ref 0.00–0.07)
BASOS ABS: 0.1 10*3/uL (ref 0.0–0.1)
Basophils Relative: 1 %
EOS ABS: 0.2 10*3/uL (ref 0.0–0.5)
EOS PCT: 2 %
HEMATOCRIT: 48.6 % (ref 39.0–52.0)
Hemoglobin: 16 g/dL (ref 13.0–17.0)
IMMATURE GRANULOCYTES: 1 %
Lymphocytes Relative: 25 %
Lymphs Abs: 2.4 10*3/uL (ref 0.7–4.0)
MCH: 31.2 pg (ref 26.0–34.0)
MCHC: 32.9 g/dL (ref 30.0–36.0)
MCV: 94.7 fL (ref 80.0–100.0)
Monocytes Absolute: 0.9 10*3/uL (ref 0.1–1.0)
Monocytes Relative: 9 %
NEUTROS PCT: 62 %
NRBC: 0 % (ref 0.0–0.2)
Neutro Abs: 6.1 10*3/uL (ref 1.7–7.7)
PLATELETS: 175 10*3/uL (ref 150–400)
RBC: 5.13 MIL/uL (ref 4.22–5.81)
RDW: 12.6 % (ref 11.5–15.5)
WBC: 9.6 10*3/uL (ref 4.0–10.5)

## 2018-06-30 LAB — I-STAT TROPONIN, ED: Troponin i, poc: 0.01 ng/mL (ref 0.00–0.08)

## 2018-06-30 LAB — BRAIN NATRIURETIC PEPTIDE: B NATRIURETIC PEPTIDE 5: 79 pg/mL (ref 0.0–100.0)

## 2018-06-30 MED ORDER — AEROCHAMBER PLUS FLO-VU MEDIUM MISC
1.0000 | Freq: Once | Status: DC
  Filled 2018-06-30 (×2): qty 1

## 2018-06-30 MED ORDER — DOXYCYCLINE HYCLATE 100 MG PO CAPS: 100.0000 mg | ORAL_CAPSULE | Freq: Two times a day (BID) | ORAL | 0 refills | Status: DC

## 2018-06-30 MED ORDER — IPRATROPIUM-ALBUTEROL 0.5-2.5 (3) MG/3ML IN SOLN
3.0000 mL | Freq: Once | RESPIRATORY_TRACT | Status: AC
  Administered 2018-06-30: 3 mL via RESPIRATORY_TRACT
  Filled 2018-06-30: qty 3

## 2018-06-30 MED ORDER — PREDNISONE 10 MG PO TABS: 20.0000 mg | ORAL_TABLET | Freq: Every day | ORAL | 0 refills | Status: DC

## 2018-06-30 MED ORDER — ACETAMINOPHEN 325 MG PO TABS
650.0000 mg | ORAL_TABLET | Freq: Once | ORAL | Status: AC
  Administered 2018-06-30: 650 mg via ORAL
  Filled 2018-06-30: qty 2

## 2018-06-30 MED ORDER — ALBUTEROL SULFATE HFA 108 (90 BASE) MCG/ACT IN AERS
2.0000 | INHALATION_SPRAY | Freq: Once | RESPIRATORY_TRACT | Status: AC
  Administered 2018-06-30: 2 via RESPIRATORY_TRACT
  Filled 2018-06-30: qty 6.7

## 2018-06-30 MED ORDER — METHYLPREDNISOLONE SODIUM SUCC 125 MG IJ SOLR
125.0000 mg | Freq: Once | INTRAMUSCULAR | Status: AC
  Administered 2018-06-30: 125 mg via INTRAVENOUS
  Filled 2018-06-30: qty 2

## 2018-06-30 NOTE — Telephone Encounter (Signed)
Pt called in stating he is feeling "run down" and having increased shortness of breath at night. He is going to send a remote transmission to see if rhythm related. He is past due for appt with Dr. Lovena Le. Will forward information to Dr. Tanna Furry nurse as well as device clinic for follow up.

## 2018-06-30 NOTE — ED Notes (Signed)
Pt ambulated to the bathroom, O2 sats at 98%.

## 2018-06-30 NOTE — ED Notes (Signed)
RN sent 1 visitor back 

## 2018-06-30 NOTE — Discharge Instructions (Addendum)
It is strongly recommended that you stop smoking. Take the antibiotic, doxycycline, as prescribed until it is gone. Take the steroid, prednisone, starting tomorrow as prescribed until it is gone. You can use the albuterol inhaler every 4-6 hours as needed for shortness of breath or wheezing. Schedule a close follow-up appointment with your primary care provider regarding your visit today. Return to the emergency department if you develop worsening shortness of breath not improved by the inhaler, fever, or new or concerning symptoms.

## 2018-06-30 NOTE — Telephone Encounter (Signed)
Appt made for 07-11-18 at 415pm with [facility]. Pt very anxious about transmission results. Pls call [phone]

## 2018-06-30 NOTE — ED Notes (Signed)
RN sent 1 visit back

## 2018-06-30 NOTE — Telephone Encounter (Signed)
Left detailed message per DPR.  Advised Pt that with his current symptoms he is ok to wait until 07/03/2018 to see Dr. Lovena Le.  Advised this nurse had been proactive about making him an appt because of his history.   Pt had cancelled cardiac CT last year d/t copay concerns after abnormal stress test and continued sob.  Advised Pt to go to ER with chest pain, sob, pain radiating down left arm, left jaw pain.  Advised if he still had further concerns he can call office.

## 2018-06-30 NOTE — ED Provider Notes (Signed)
[facility] EMERGENCY DEPARTMENT Provider Note   CSN: [id number] Arrival date & time: 06/30/18  1355    History   Chief Complaint Chief Complaint  Patient presents with  . Shortness of Breath    HPI Mike Bean is a 58 y.o. male with past medical history of CHF, atrial fibrillation, pacemaker, COPD, alcohol abuse, presenting to the emergency department with complaint of cough and shortness of breath that has been worsening x1 week.  Patient states his cough is productive of a white salty phlegm.  His shortness of breath is made worse with laying flat, wakes him up at night gasping for air.  He states exertion also causes him to feel more short of breath.  He has had generalized chest tightness, though states this is not a pain this is more of a tightness that he thinks is related to his breathing.  He states his lateral lower ribs feel sore as well.  He has had slightly increased salt intake and increasing lower extremity swelling.  He currently has a pacemaker that controls his A. fib, which was today by his cardiologist.  He states he has not had any episodes of atrial fibrillation in some time.  States he has been noncompliant with anticoagulation x5 to 6 months.  In that timeframe he reports he has not had any episodes of atrial fibrillation.  He does not take any diuretics.  No congestion, runny nose, sore throat, fever, chest pain.  Endorses daily tobacco use.  No history of DVT or PE.     The history is provided by the patient.    Past Medical History:  Diagnosis Date  . CHF (congestive heart failure) ([place])   . History of alcohol abuse   . Hyperlipidemia   . Nonischemic cardiomyopathy ([place])    Presumed to be tachycardia mediated  . Pacemaker 2012  . Persistent atrial fibrillation 2012  . Snores     Patient Active Problem List   Diagnosis Date Noted  . ALCOHOLIC CARDIOMYOPATHY 97/05/6376  . SNORING 04/19/2010  . ATRIAL FIBRILLATION 04/18/2010  . CHF  04/18/2010  . COPD 04/18/2010    Past Surgical History:  Procedure Laterality Date  . APPENDECTOMY     S/P  . CARDIAC CATHETERIZATION  11/2009   Normal coronary arteries   . PACEMAKER INSERTION  2012   [facility]        Home Medications    Prior to Admission medications   Medication Sig Start Date End Date Taking? Authorizing Provider  apixaban (ELIQUIS) 5 MG TABS tablet Take 1 tablet (5 mg total) by mouth 2 (two) times daily. DO NOT START UNTIL AFTER COLONOSCOPY 07/04/17   Evans Lance, MD  aspirin 81 MG tablet Take 81 mg by mouth daily as needed. Reported on 09/14/2015    [provider]  doxycycline (VIBRAMYCIN) 100 MG capsule Take 1 capsule (100 mg total) by mouth 2 (two) times daily for 7 days. 06/30/18 07/07/18  Wilena Tyndall, [place] N, PA-C  predniSONE (DELTASONE) 10 MG tablet Take 2 tablets (20 mg total) by mouth daily for 5 days. 06/30/18 07/05/18  Trayquan Kolakowski, [place] N, PA-C    Family History Family History  Problem Relation Age of Onset  . Heart attack Father 67       MI  . Colon cancer Neg Hx     Social History Social History   Tobacco Use  . Smoking status: Current Every Day Smoker    Packs/day: 0.25  Types: Cigarettes  . Smokeless tobacco: Never Used  Substance Use Topics  . Alcohol use: Yes    Alcohol/week: 21.0 standard drinks    Types: 21 Standard drinks or equivalent per week    Comment: At least 3 beers a day  . Drug use: No     Allergies   Patient has no known allergies.   Review of Systems Review of Systems  HENT: Negative for congestion and sore throat.   Respiratory: Positive for cough, chest tightness and shortness of breath.   Cardiovascular: Positive for leg swelling. Negative for chest pain and palpitations.  All other systems reviewed and are negative.    Physical Exam Updated Vital Signs BP 131/87   Pulse 70   Temp 97.9 F (36.6 C) (Oral)   Resp 14   Ht 5\' 10"  (1.778 m)   Wt 99.8 kg   SpO2 95%   BMI 31.57 kg/m    Physical Exam Vitals signs and nursing note reviewed.  Constitutional:      General: He is not in acute distress.    Appearance: He is well-developed. He is not ill-appearing.  HENT:     Head: Normocephalic and atraumatic.     Mouth/Throat:     Mouth: Mucous membranes are moist.     Pharynx: Oropharynx is clear.  Eyes:     Conjunctiva/sclera: Conjunctivae normal.  Neck:     Musculoskeletal: Normal range of motion and neck supple.  Cardiovascular:     Rate and Rhythm: Normal rate and regular rhythm.  Pulmonary:     Effort: Pulmonary effort is normal. No respiratory distress.     Breath sounds: Decreased breath sounds and wheezing present.  Abdominal:     Palpations: Abdomen is soft.  Musculoskeletal:     Right lower leg: No edema.     Left lower leg: No edema.  Skin:    General: Skin is warm.  Neurological:     Mental Status: He is alert.  Psychiatric:        Behavior: Behavior normal.      ED Treatments / Results  Labs (all labs ordered are listed, but only abnormal results are displayed) Labs Reviewed  BASIC METABOLIC PANEL - Abnormal; Notable for the following components:      Result Value   Sodium 134 (*)    Glucose, Bld 129 (*)    Calcium 8.7 (*)    All other components within normal limits  CBC WITH DIFFERENTIAL/PLATELET - Abnormal; Notable for the following components:   Abs Immature Granulocytes 0.08 (*)    All other components within normal limits  BRAIN NATRIURETIC PEPTIDE  I-STAT TROPONIN, ED    EKG EKG Interpretation  Date/Time:  Monday June 30 2018 14:03:32 EST Ventricular Rate:  76 PR Interval:    QRS Duration: 145 QT Interval:  436 QTC Calculation: 491 R Axis:   -116 Text Interpretation:  Electronic ventricular pacemaker Confirmed by Lajean Saver [phone]) on 06/30/2018 2:11:12 PM   Radiology Dg Chest 2 View  Result Date: 06/30/2018 CLINICAL DATA:  Shortness of breath, productive cough and central chest pressure for 2-3 weeks, history  of atrial fibrillation, pacemaker, non ischemic cardiomyopathy, CHF, smoker EXAM: CHEST - 2 VIEW COMPARISON:  10/25/2017 FINDINGS: LEFT subclavian transvenous pacemaker with leads projecting at RIGHT atrium, RIGHT ventricle, and coronary sinus. Normal heart size, mediastinal contours, and pulmonary vascularity. Lungs mildly hyperinflated but clear. No pulmonary infiltrate, pleural effusion or pneumothorax. Bones unremarkable. IMPRESSION: Chronic mild pulmonary hyperinflation. No acute infiltrate/abnormalities.  Electronically Signed   By: Lavonia Dana M.D.   On: 06/30/2018 14:52    Procedures Procedures (including critical care time)  Medications Ordered in ED Medications  albuterol (PROVENTIL HFA;VENTOLIN HFA) 108 (90 Base) MCG/ACT inhaler 2 puff (has no administration in time range)  ipratropium-albuterol (DUONEB) 0.5-2.5 (3) MG/3ML nebulizer solution 3 mL (3 mLs Nebulization Given 06/30/18 1424)  methylPREDNISolone sodium succinate (SOLU-MEDROL) 125 mg/2 mL injection 125 mg (125 mg Intravenous Given 06/30/18 1546)  acetaminophen (TYLENOL) tablet 650 mg (650 mg Oral Given 06/30/18 1622)     Initial Impression / Assessment and Plan / ED Course  I have reviewed the triage vital signs and the nursing notes.  Pertinent labs & imaging results that were available during my care of the patient were reviewed by me and considered in my medical decision making (see chart for details).  Clinical Course as of Jun 29 1632  Mon Jun 30, 2018  1523 Patient reevaluated.  Reports significant improvement after breathing treatment.  Suspect shortness of breath secondary to COPD exacerbation.  Chest x-ray negative for evidence of CHF.  Pending BNP.  Will dose with Solu-Medrol   [JR]    Clinical Course User Index [JR] Hena Ewalt, [place] N, PA-C       Patient presenting with shortness of breath and cough x1 week.  COPD and CHF.  Endorses heavy cigarette smoking.  On exam, vital signs are stable.  O2 saturation 97%  on room air.  Normal work of breathing.  Lung sounds diminished with wheezes bilaterally.  No lower extremity swelling appreciated.  EKG with normal rate and rhythm.  No ischemic changes.  Troponin negative.  BNP within normal limits.  Labs without leukocytosis.  X-ray with hyperinflation.  No infiltrate or effusion.  Treated in the ED with DuoNeb and steroid with significant improvement.  Ambulated and maintained O2 saturation 98% on room air without tachypnea or significant shortness of breath.  Patient daughter arrived at the end of work-up and voiced concerns, however discussed all results and provided reassurance.   Will discharge with prednisone,  albuterol, doxycycline, and encourage close PCP follow-up.  Strongly encourage smoking cessation. Patient is agreeable to plan and safe for discharge at this time.  Patient discussed with and evaluated by Dr. Ashok Cordia, who agrees with care plan.  Discussed results, findings, treatment and follow up. Patient advised of return precautions. Patient verbalized understanding and agreed with plan.  Final Clinical Impressions(s) / ED Diagnoses   Final diagnoses:  COPD exacerbation [facility])    ED Discharge Orders         Ordered    predniSONE (DELTASONE) 10 MG tablet  Daily     06/30/18 1633    doxycycline (VIBRAMYCIN) 100 MG capsule  2 times daily     06/30/18 1633           Amra Shukla, [place] N, [place] 06/30/18 1646    Lajean Saver, MD 07/01/18 [phone]

## 2018-06-30 NOTE — Telephone Encounter (Signed)
New Message   Patient is calling because he is feeling a bit more fatigue then usual which is very concerning to him. He was scheduled for 3/13 I was able to reschedule him for 3/5. He still wants to speak with the nurse. Please call.

## 2018-06-30 NOTE — ED Notes (Signed)
Pts daughter requested to speak to nurse, nurse entered, pt then requested to speak to PA, PA entered - All discussing her fathers care. Pt daughter then came up to desk again and requested a second opinion, preferably from a doctor, because she states that someone is missing something and her father is very sick. PA and Doctor notified.

## 2018-06-30 NOTE — Telephone Encounter (Signed)
Transmission reviewed. Normal device function. Few PVC's on presenting. CRT pacing >99%. No episodes. Reviewed with patient. He will follow up with PCP to look for alternate causes of fatigue/cough. I have advised that if no better or he is concerned before appt with Dr Lovena Le to call us back and we will see sooner  Chanetta Marshall, NP 06/30/2018 10:33 AM

## 2018-06-30 NOTE — ED Triage Notes (Signed)
Pt reports a cough, central chest pressure and rib pain. Endorses SOB with exertion, light-headedness and weakness in legs. Hx of CHF

## 2018-07-03 ENCOUNTER — Encounter: Payer: PRIVATE HEALTH INSURANCE | Admitting: *Deleted

## 2018-07-03 ENCOUNTER — Encounter: Payer: Self-pay | Admitting: Internal Medicine

## 2018-07-03 ENCOUNTER — Ambulatory Visit (INDEPENDENT_AMBULATORY_CARE_PROVIDER_SITE_OTHER): Payer: PRIVATE HEALTH INSURANCE | Admitting: Internal Medicine

## 2018-07-03 VITALS — BP 142/88 | HR 79 | Ht 70.0 in | Wt 218.0 lb

## 2018-07-03 DIAGNOSIS — I4811 Longstanding persistent atrial fibrillation: Secondary | ICD-10-CM | POA: Diagnosis not present

## 2018-07-03 DIAGNOSIS — R079 Chest pain, unspecified: Secondary | ICD-10-CM | POA: Diagnosis not present

## 2018-07-03 DIAGNOSIS — Z950 Presence of cardiac pacemaker: Secondary | ICD-10-CM

## 2018-07-03 DIAGNOSIS — I519 Heart disease, unspecified: Secondary | ICD-10-CM | POA: Diagnosis not present

## 2018-07-03 MED ORDER — SILDENAFIL CITRATE 50 MG PO TABS: 50.0000 mg | ORAL_TABLET | Freq: Every day | ORAL | 0 refills | Status: DC | PRN

## 2018-07-03 MED ORDER — APIXABAN 5 MG PO TABS: 5.0000 mg | ORAL_TABLET | Freq: Two times a day (BID) | ORAL | 11 refills | Status: DC

## 2018-07-03 NOTE — Patient Instructions (Addendum)
Medication Instructions:  Your physician recommends that you continue on your current medications as directed. Please refer to the Current Medication list given to you today.  Labwork: None ordered.  Testing/Procedures: None ordered.  Follow-Up: Your physician wants you to follow-up in: 6 months with Dr. Lovena Le.   You will receive a reminder letter in the mail two months in advance. If you don't receive a letter, please call our office to schedule the follow-up appointment.  Remote monitoring is used to monitor your Pacemaker from home. This monitoring reduces the number of office visits required to check your device to one time per year. It allows [place] to keep an eye on the functioning of your device to ensure it is working properly. You are scheduled for a device check from home on 10/02/2018. You may send your transmission at any time that day. If you have a wireless device, the transmission will be sent automatically. After your physician reviews your transmission, you will receive a postcard with your next transmission date.  Any Other Special Instructions Will Be Listed Below (If Applicable).  If you need a refill on your cardiac medications before your next appointment, please call your pharmacy.

## 2018-07-03 NOTE — Progress Notes (Signed)
HPI Mike Bean returns today for followup. I saw him last 4 months ago. He has a h/o atrial fib, s/p AVNode ablation and biv PPM insertion several years ago. He c/o feeling more fatigued and tired. He is still smoking. He has chronic congestion. He has not had an assessment of his LV function for several years. He denies anginal symptoms. He does have non-cardiac chest pain. Cath 8 years ago was negative. His symptoms do not occur with exertion. He has stopped smoking for 3 days. He is taking his daughter to drug rehab after she overdosed on benzos a few weeks ago.   No Known Allergies   Current Outpatient Medications  Medication Sig Dispense Refill  . apixaban (ELIQUIS) 5 MG TABS tablet Take 1 tablet (5 mg total) by mouth 2 (two) times daily. DO NOT START UNTIL AFTER COLONOSCOPY 60 tablet 11  . Coenzyme Q10 (CO Q 10) 100 MG CAPS Take 1 capsule by mouth daily.     Current Facility-Administered Medications  Medication Dose Route Frequency Provider Last Rate Last Dose  . 0.9 %  sodium chloride infusion  500 mL Intravenous Continuous Ladene Artist, MD         Past Medical History:  Diagnosis Date  . CHF (congestive heart failure) ([place])   . History of alcohol abuse   . Hyperlipidemia   . Nonischemic cardiomyopathy ([place])    Presumed to be tachycardia mediated  . Pacemaker 2012  . Persistent atrial fibrillation 2012  . Snores     ROS:   All systems reviewed and negative except as noted in the HPI.   Past Surgical History:  Procedure Laterality Date  . APPENDECTOMY     S/P  . CARDIAC CATHETERIZATION  11/2009   Normal coronary arteries   . PACEMAKER INSERTION  2012   [facility]     Family History  Problem Relation Age of Onset  . Heart attack Father 89       MI  . Colon cancer Neg Hx      Social History   Socioeconomic History  . Marital status: Divorced    Spouse name: Not on file  . Number of children: 2  . Years of education: Not on file  . Highest  education level: Not on file  Occupational History  . Occupation: Works in Scientist, clinical (histocompatibility and immunogenetics): [employer]  . Financial resource strain: Not on file  . Food insecurity:    Worry: Not on file    Inability: Not on file  . Transportation needs:    Medical: Not on file    Non-medical: Not on file  Tobacco Use  . Smoking status: Current Every Day Smoker    Packs/day: 0.25    Types: Cigarettes  . Smokeless tobacco: Never Used  Substance and Sexual Activity  . Alcohol use: Yes    Alcohol/week: 21.0 standard drinks    Types: 21 Standard drinks or equivalent per week    Comment: At least 3 beers a day  . Drug use: No  . Sexual activity: Not on file  Lifestyle  . Physical activity:    Days per week: Not on file    Minutes per session: Not on file  . Stress: Not on file  Relationships  . Social connections:    Talks on phone: Not on file    Gets together: Not on file    Attends religious service: Not on file  Active member of club or organization: Not on file    Attends meetings of clubs or organizations: Not on file    Relationship status: Not on file  . Intimate partner violence:    Fear of current or ex partner: Not on file    Emotionally abused: Not on file    Physically abused: Not on file    Forced sexual activity: Not on file  Other Topics Concern  . Not on file  Social History Narrative   Lives in [place]   Separated   2 children, no grandchildren     BP (!) 142/88   Pulse 79   Ht 5\' 10"  (1.778 m)   Wt 218 lb (98.9 kg)   SpO2 97%   BMI 31.28 kg/m   Physical Exam:  Well appearing NAD HEENT: Unremarkable Neck:  No JVD, no thyromegally Lymphatics:  No adenopathy Back:  No CVA tenderness Lungs:  Clear with no wheezes HEART:  Regular rate rhythm, no murmurs, no rubs, no clicks Abd:  soft, positive bowel sounds, no organomegally, no rebound, no guarding Ext:  2 plus pulses, no edema, no cyanosis, no clubbing Skin:  No rashes no  nodules Neuro:  CN II through XII intact, motor grossly intact  EKG - NSR with RV pacing  DEVICE  Normal device function.  See PaceArt for details.   Assess/Plan: 1. Chest pain - I have reassure the patient and he will call us if he has any anginal symptoms. 2. CHB - he is asymptomatic, s/p PPM insertion. 3. COPD - he has stopped smoking for 3 days. Hopefully this will continue.  Mike Bean.D.

## 2018-07-08 LAB — CUP PACEART INCLINIC DEVICE CHECK
Battery Remaining Longevity: 28 mo
Battery Voltage: 2.81 V
Brady Statistic RA Percent Paced: 0 %
Brady Statistic RV Percent Paced: 99.9 %
Implantable Lead Implant Date: 20120228
Implantable Lead Implant Date: 20120228
Implantable Lead Location: 753858
Implantable Lead Location: 753859
Implantable Lead Location: 753860
Implantable Lead Model: 4196
Implantable Pulse Generator Implant Date: 20120228
Lead Channel Impedance Value: 475 Ohm
Lead Channel Impedance Value: 737.5 Ohm
Lead Channel Pacing Threshold Amplitude: 1.5 V
Lead Channel Pacing Threshold Pulse Width: 0.5 ms
Lead Channel Sensing Intrinsic Amplitude: 12 mV
Lead Channel Setting Pacing Amplitude: 2.5 V
Lead Channel Setting Pacing Pulse Width: 0.5 ms
Lead Channel Setting Sensing Sensitivity: 2 mV
MDC IDC LEAD IMPLANT DT: 20120228
MDC IDC MSMT LEADCHNL RV PACING THRESHOLD AMPLITUDE: 0.875 V
MDC IDC MSMT LEADCHNL RV PACING THRESHOLD PULSEWIDTH: 0.5 ms
MDC IDC SESS DTM: 20200305171346
MDC IDC SET LEADCHNL LV PACING PULSEWIDTH: 0.5 ms
MDC IDC SET LEADCHNL RV PACING AMPLITUDE: 2 V
Pulse Gen Model: 3210
Pulse Gen Serial Number: 2485643

## 2018-07-11 ENCOUNTER — Encounter: Payer: PRIVATE HEALTH INSURANCE | Admitting: Internal Medicine

## 2018-08-09 ENCOUNTER — Emergency Department (HOSPITAL_COMMUNITY): Payer: PRIVATE HEALTH INSURANCE

## 2018-08-09 ENCOUNTER — Other Ambulatory Visit: Payer: Self-pay

## 2018-08-09 ENCOUNTER — Emergency Department (HOSPITAL_COMMUNITY)
Admission: EM | Admit: 2018-08-09 | Discharge: 2018-08-09 | Discharge status: Against Medical Advice | Payer: PRIVATE HEALTH INSURANCE | Attending: Emergency Medicine | Admitting: Emergency Medicine

## 2018-08-09 ENCOUNTER — Encounter (HOSPITAL_COMMUNITY): Payer: Self-pay

## 2018-08-09 DIAGNOSIS — S0081XA Abrasion of other part of head, initial encounter: Secondary | ICD-10-CM | POA: Insufficient documentation

## 2018-08-09 DIAGNOSIS — S0031XA Abrasion of nose, initial encounter: Secondary | ICD-10-CM | POA: Insufficient documentation

## 2018-08-09 DIAGNOSIS — Z950 Presence of cardiac pacemaker: Secondary | ICD-10-CM | POA: Insufficient documentation

## 2018-08-09 DIAGNOSIS — I4891 Unspecified atrial fibrillation: Secondary | ICD-10-CM | POA: Insufficient documentation

## 2018-08-09 DIAGNOSIS — S0990XA Unspecified injury of head, initial encounter: Secondary | ICD-10-CM | POA: Diagnosis not present

## 2018-08-09 DIAGNOSIS — Z7901 Long term (current) use of anticoagulants: Secondary | ICD-10-CM | POA: Diagnosis not present

## 2018-08-09 DIAGNOSIS — Z5320 Procedure and treatment not carried out because of patient's decision for unspecified reasons: Secondary | ICD-10-CM | POA: Diagnosis not present

## 2018-08-09 DIAGNOSIS — F17210 Nicotine dependence, cigarettes, uncomplicated: Secondary | ICD-10-CM | POA: Diagnosis not present

## 2018-08-09 DIAGNOSIS — S0181XA Laceration without foreign body of other part of head, initial encounter: Secondary | ICD-10-CM

## 2018-08-09 DIAGNOSIS — I509 Heart failure, unspecified: Secondary | ICD-10-CM | POA: Insufficient documentation

## 2018-08-09 DIAGNOSIS — Y929 Unspecified place or not applicable: Secondary | ICD-10-CM | POA: Insufficient documentation

## 2018-08-09 DIAGNOSIS — Y999 Unspecified external cause status: Secondary | ICD-10-CM | POA: Diagnosis not present

## 2018-08-09 DIAGNOSIS — W1830XA Fall on same level, unspecified, initial encounter: Secondary | ICD-10-CM | POA: Insufficient documentation

## 2018-08-09 DIAGNOSIS — Y939 Activity, unspecified: Secondary | ICD-10-CM | POA: Insufficient documentation

## 2018-08-09 MED ORDER — LIDOCAINE-EPINEPHRINE 1 %-1:100000 IJ SOLN
10.0000 mL | Freq: Once | INTRAMUSCULAR | Status: DC
  Filled 2018-08-09: qty 10

## 2018-08-09 MED ORDER — LIDOCAINE-EPINEPHRINE-TETRACAINE (LET) SOLUTION
3.0000 mL | Freq: Once | NASAL | Status: AC
  Administered 2018-08-09: 3 mL via TOPICAL
  Filled 2018-08-09: qty 3

## 2018-08-09 NOTE — ED Notes (Signed)
Pt left AMA.  Pt encouraged to stay, however there was no stopping the patient from leaving.  Pt encouraged to keep the wounds clean and covered and to return if needed.

## 2018-08-09 NOTE — ED Provider Notes (Signed)
[facility] EMERGENCY DEPARTMENT Provider Note   CSN: [id number] Arrival date & time: 08/09/18  2002    History   Chief Complaint Chief Complaint  Patient presents with   Fall    HPI Mike Bean is a 58 y.o. male.     58 year old male with past medical history including atrial fibrillation on Eliquis, CHF, COPD, alcohol abuse, pacemaker who presents with fall.  Patient admits to drinking a significant amount of alcohol today.  Just prior to arrival, he lost his balance and fell from standing.  He hit his right forehead.  He did not lose consciousness.  He has had no nausea or vomiting since the event.  He reports pain in the area where he sustained lacerations but denies any other areas of pain.  He denies any extremity injury, back or neck pain.  He is up-to-date on tetanus vaccination.  The history is provided by the patient.  Fall     Past Medical History:  Diagnosis Date   CHF (congestive heart failure) ([place])    History of alcohol abuse    Hyperlipidemia    Nonischemic cardiomyopathy ([place])    Presumed to be tachycardia mediated   Pacemaker 2012   Persistent atrial fibrillation 2012   Snores     Patient Active Problem List   Diagnosis Date Noted   ALCOHOLIC CARDIOMYOPATHY 00/86/7619   SNORING 04/19/2010   ATRIAL FIBRILLATION 04/18/2010   CHF 04/18/2010   COPD 04/18/2010    Past Surgical History:  Procedure Laterality Date   APPENDECTOMY     S/P   CARDIAC CATHETERIZATION  11/2009   Normal coronary arteries    PACEMAKER INSERTION  2012   [facility]        Home Medications    Prior to Admission medications   Medication Sig Start Date End Date Taking? Authorizing Provider  apixaban (ELIQUIS) 5 MG TABS tablet Take 1 tablet (5 mg total) by mouth 2 (two) times daily. 07/03/18   Evans Lance, MD  Coenzyme Q10 (CO Q 10) 100 MG CAPS Take 1 capsule by mouth daily.    [provider]  sildenafil (VIAGRA) 50 MG tablet  Take 1 tablet (50 mg total) by mouth daily as needed for erectile dysfunction. 07/03/18   Evans Lance, MD    Family History Family History  Problem Relation Age of Onset   Heart attack Father 60       MI   Colon cancer Neg Hx     Social History Social History   Tobacco Use   Smoking status: Current Every Day Smoker    Packs/day: 0.25    Types: Cigarettes   Smokeless tobacco: Never Used  Substance Use Topics   Alcohol use: Yes    Alcohol/week: 21.0 standard drinks    Types: 21 Standard drinks or equivalent per week    Comment: At least 3 beers a day   Drug use: No     Allergies   Patient has no known allergies.   Review of Systems Review of Systems All other systems reviewed and are negative except that which was mentioned in HPI   Physical Exam Updated Vital Signs BP (!) 144/97    Pulse 70    Temp 98 F (36.7 C) (Oral)    Resp 19    Ht 5\' 10"  (1.778 m)    Wt 97.5 kg    SpO2 97%    BMI 30.85 kg/m   Physical Exam Vitals signs  and nursing note reviewed.  Constitutional:      General: He is not in acute distress.    Appearance: He is well-developed.  HENT:     Head: Normocephalic.      Comments: 3-4cm laceration above R eyebrow; more superficial 3cm laceration R forehead; abrasion top of forehead near scalp    Nose:     Comments: Abrasion nasal bridge Eyes:     Conjunctiva/sclera: Conjunctivae normal.     Pupils: Pupils are equal, round, and reactive to light.  Neck:     Musculoskeletal: Neck supple.  Cardiovascular:     Rate and Rhythm: Normal rate and regular rhythm.     Heart sounds: Normal heart sounds. No murmur.  Pulmonary:     Effort: Pulmonary effort is normal.     Breath sounds: Normal breath sounds.  Abdominal:     General: Bowel sounds are normal. There is no distension.     Palpations: Abdomen is soft.     Tenderness: There is no abdominal tenderness.  Musculoskeletal: Normal range of motion.        General: No tenderness,  deformity or signs of injury.  Skin:    General: Skin is warm and dry.  Neurological:     Mental Status: He is alert and oriented to person, place, and time.     Comments: Intoxicated but oriented and following commands      ED Treatments / Results  Labs (all labs ordered are listed, but only abnormal results are displayed) Labs Reviewed - No data to display  EKG EKG Interpretation  Date/Time:  Saturday August 09 2018 20:12:01 EDT Ventricular Rate:  70 PR Interval:    QRS Duration: 144 QT Interval:  493 QTC Calculation: 533 R Axis:   -121 Text Interpretation:  Sinus or ectopic atrial rhythm Short PR interval Consider dextrocardia similar to previous Confirmed by Theotis Burrow [phone]) on 08/09/2018 8:20:20 PM   Radiology Ct Head Wo Contrast  Result Date: 08/09/2018 CLINICAL DATA:  Lost balance and fall from standing. ETOH. Forehead laceration. Head trauma, intracranial venous injury suspected head trauma, on Eliquis; C-spine trauma, high clinical risk (NEXUS/CCR) EXAM: CT HEAD WITHOUT CONTRAST CT CERVICAL SPINE WITHOUT CONTRAST TECHNIQUE: Multidetector CT imaging of the head and cervical spine was performed following the standard protocol without intravenous contrast. Multiplanar CT image reconstructions of the cervical spine were also generated. COMPARISON:  None. FINDINGS: CT HEAD FINDINGS Brain: No evidence of acute infarction, hemorrhage, hydrocephalus, extra-axial collection or mass lesion/mass effect. Encephalomalacia in the left parietal lobe, probable remote prior infarct. Vascular: Generalized increased density of the intra-axial structures without focal hyperdense vessel. Skull: Right frontal scalp hematoma and laceration. No skull fracture. No focal lesion. Sinuses/Orbits: Mild mucosal thickening without fluid level or fracture. Mastoid air cells are clear. Other: None. CT CERVICAL SPINE FINDINGS Alignment: Straightening of normal lordosis without traumatic subluxation. Skull  base and vertebrae: No acute fracture. Vertebral body heights are maintained. The dens and skull base are intact. Soft tissues and spinal canal: No prevertebral fluid or swelling. No visible canal hematoma. Disc levels: Multilevel disc space narrowing and endplate spurring most prominent at C5-C6. Multilevel facet arthropathy. Upper chest: Apical emphysema.  No acute findings. Other: Carotid calcifications. IMPRESSION: 1. Right frontal scalp hematoma and laceration. No skull fracture or acute intracranial abnormality. 2. 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 3. Multilevel degenerative change throughout the cervical spine without acute fracture or subluxation. 4.  Emphysema (ICD10-J43.9).  Noted at the lung apices. Electronically  Signed   By: Keith Rake M.D.   On: 08/09/2018 21:52   Ct Cervical Spine Wo Contrast  Result Date: 08/09/2018 CLINICAL DATA:  Lost balance and fall from standing. ETOH. Forehead laceration. Head trauma, intracranial venous injury suspected head trauma, on Eliquis; C-spine trauma, high clinical risk (NEXUS/CCR) EXAM: CT HEAD WITHOUT CONTRAST CT CERVICAL SPINE WITHOUT CONTRAST TECHNIQUE: Multidetector CT imaging of the head and cervical spine was performed following the standard protocol without intravenous contrast. Multiplanar CT image reconstructions of the cervical spine were also generated. COMPARISON:  None. FINDINGS: CT HEAD FINDINGS Brain: No evidence of acute infarction, hemorrhage, hydrocephalus, extra-axial collection or mass lesion/mass effect. Encephalomalacia in the left parietal lobe, probable remote prior infarct. Vascular: Generalized increased density of the intra-axial structures without focal hyperdense vessel. Skull: Right frontal scalp hematoma and laceration. No skull fracture. No focal lesion. Sinuses/Orbits: Mild mucosal thickening without fluid level or fracture. Mastoid air cells are clear. Other: None. CT CERVICAL SPINE FINDINGS  Alignment: Straightening of normal lordosis without traumatic subluxation. Skull base and vertebrae: No acute fracture. Vertebral body heights are maintained. The dens and skull base are intact. Soft tissues and spinal canal: No prevertebral fluid or swelling. No visible canal hematoma. Disc levels: Multilevel disc space narrowing and endplate spurring most prominent at C5-C6. Multilevel facet arthropathy. Upper chest: Apical emphysema.  No acute findings. Other: Carotid calcifications. IMPRESSION: 1. Right frontal scalp hematoma and laceration. No skull fracture or acute intracranial abnormality. 2. 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 3. Multilevel degenerative change throughout the cervical spine without acute fracture or subluxation. 4.  Emphysema (ICD10-J43.9).  Noted at the lung apices. Electronically Signed   By: Keith Rake M.D.   On: 08/09/2018 21:52    Procedures Procedures (including critical care time)  Medications Ordered in ED Medications  lidocaine-EPINEPHrine (XYLOCAINE W/EPI) 1 %-1:100000 (with pres) injection 10 mL (has no administration in time range)  lidocaine-EPINEPHrine-tetracaine (LET) solution (3 mLs Topical Given 08/09/18 2055)     Initial Impression / Assessment and Plan / ED Course  I have reviewed the triage vital signs and the nursing notes.  Pertinent imaging results that were available during my care of the patient were reviewed by me and considered in my medical decision making (see chart for details).        Alert and oriented on exam, mildly hypertensive. Following commands, fluent speech. Injuries as above. CT head and C-spine negative for acute intracranial or c-spine injury. During initial evaluation, I recommended sutures for lac above eyebrow given deep and gaping, then dermabond for more superficial wound above it. PT agreed and I had applied LET. After CTs finalized, I went back to repair wounds and patient was gone. Bedside  nurse informed me that patient had stated he did not want to stay and therefore left. PT left before I was able to have AMA discussion.  Final Clinical Impressions(s) / ED Diagnoses   Final diagnoses:  Injury of head, initial encounter  Laceration of forehead, initial encounter    ED Discharge Orders    None       Alexys Lobello, Wenda Overland, MD 08/09/18 2329

## 2018-08-09 NOTE — ED Notes (Signed)
A few months ago.

## 2018-08-09 NOTE — ED Triage Notes (Signed)
Pt via EMS for fall from standing.  Pt on eliquis and intoxicated per pt endorsement.  Pt states he lost his balance and fell.

## 2018-10-02 ENCOUNTER — Ambulatory Visit (INDEPENDENT_AMBULATORY_CARE_PROVIDER_SITE_OTHER): Payer: PRIVATE HEALTH INSURANCE | Admitting: *Deleted

## 2018-10-02 DIAGNOSIS — I4811 Longstanding persistent atrial fibrillation: Secondary | ICD-10-CM

## 2018-10-02 DIAGNOSIS — I429 Cardiomyopathy, unspecified: Secondary | ICD-10-CM | POA: Diagnosis not present

## 2018-10-03 LAB — CUP PACEART REMOTE DEVICE CHECK
Battery Remaining Longevity: 23 mo
Battery Remaining Percentage: 22 %
Battery Voltage: 2.78 V
Date Time Interrogation Session: 20200604065036
Implantable Lead Implant Date: 20120228
Implantable Lead Implant Date: 20120228
Implantable Lead Implant Date: 20120228
Implantable Lead Location: 753858
Implantable Lead Location: 753859
Implantable Lead Location: 753860
Implantable Lead Model: 4196
Implantable Pulse Generator Implant Date: 20120228
Lead Channel Impedance Value: 480 Ohm
Lead Channel Impedance Value: 830 Ohm
Lead Channel Pacing Threshold Amplitude: 0.75 V
Lead Channel Pacing Threshold Amplitude: 1.25 V
Lead Channel Pacing Threshold Pulse Width: 0.5 ms
Lead Channel Pacing Threshold Pulse Width: 0.5 ms
Lead Channel Sensing Intrinsic Amplitude: 11 mV
Lead Channel Setting Pacing Amplitude: 2 V
Lead Channel Setting Pacing Amplitude: 2.25 V
Lead Channel Setting Pacing Pulse Width: 0.5 ms
Lead Channel Setting Pacing Pulse Width: 0.5 ms
Lead Channel Setting Sensing Sensitivity: 2 mV
Pulse Gen Model: 3210
Pulse Gen Serial Number: 2485643

## 2018-10-09 NOTE — Progress Notes (Signed)
Remote pacemaker transmission.   

## 2018-12-22 ENCOUNTER — Telehealth: Payer: Self-pay | Admitting: Cardiology

## 2018-12-22 NOTE — Telephone Encounter (Signed)
Patient called and stated that he has had some numbness in his left hand for 2-3 weeks and then last night he he felt palpitations, sharp chest pain and pressure. Patient states that he is dizzy now. He stated that he had 5-6 beers yesterday and he isn't sure if the beer is causing him to feel this way or if something is wrong w/ his heart / device. Call routed to [facility].

## 2018-12-22 NOTE — Telephone Encounter (Signed)
Spoke with patient. In addition to the numbness in his left hand for the past 2-3 weeks, he reports onset of chest pressure and sharp chest pains beginning around 8pm last night. Symptoms were initially intermittent and he thought he may have "overdone it over the weekend," so he went to bed. He woke up around 2am today with steady chest pressure that improved over the course of a few hours. Reports ShOB is at baseline. He notes he feels lightheaded and dizzy today. No way to check BP or pulse ox. He has not taken his Eliquis in 4-5 months. He requests an appointment tomorrow, reports he is trying to avoid the ED.  Reviewed transmission. Normal CRT-P function, presenting rhythm BiVP @ 70bpm. Histograms and lead trends stable. Advised pt transmission is normal and does not explain his symptoms. Will discuss with Dr. Lovena Le. Pt in agreement with plan.

## 2018-12-22 NOTE — Telephone Encounter (Signed)
Per Dr. Lovena Le, offered appointment on 12/25/18 at 4:00pm. Pt accepted appointment. ED precautions given for new or worsening symptoms in the interim. Pt verbalizes understanding of recommendations and agreement with plan.

## 2018-12-25 ENCOUNTER — Encounter: Payer: Self-pay | Admitting: Internal Medicine

## 2018-12-25 ENCOUNTER — Encounter (INDEPENDENT_AMBULATORY_CARE_PROVIDER_SITE_OTHER): Payer: Self-pay

## 2018-12-25 ENCOUNTER — Ambulatory Visit (INDEPENDENT_AMBULATORY_CARE_PROVIDER_SITE_OTHER): Payer: PRIVATE HEALTH INSURANCE | Admitting: Internal Medicine

## 2018-12-25 ENCOUNTER — Other Ambulatory Visit: Payer: Self-pay

## 2018-12-25 VITALS — BP 130/86 | HR 70 | Ht 70.0 in | Wt 197.2 lb

## 2018-12-25 DIAGNOSIS — Z950 Presence of cardiac pacemaker: Secondary | ICD-10-CM | POA: Diagnosis not present

## 2018-12-25 DIAGNOSIS — I4891 Unspecified atrial fibrillation: Secondary | ICD-10-CM | POA: Diagnosis not present

## 2018-12-25 DIAGNOSIS — I5022 Chronic systolic (congestive) heart failure: Secondary | ICD-10-CM | POA: Diagnosis not present

## 2018-12-25 MED ORDER — APIXABAN 5 MG PO TABS: 5.0000 mg | ORAL_TABLET | Freq: Two times a day (BID) | ORAL | 11 refills | Status: DC

## 2018-12-25 NOTE — Progress Notes (Signed)
HPI [name] returns today for followup. He is a pleasant 58 yo man with a h/o atrial fib, s/p AV node ablation. He has been non-compliant with his anti-coagulation. He notes an episode of chest pain several days ago. He admits to drinking a lot more ETOH than he knows that he should. "I went on a bender." He began to experience chest and shoulder pain. He did not seek medical attention. He denies sob. His symptoms are non-exertional. No edema. He lost 25 lbs and appears to have kept the weight off.  No Known Allergies   Current Outpatient Medications  Medication Sig Dispense Refill  . apixaban (ELIQUIS) 5 MG TABS tablet Take 1 tablet (5 mg total) by mouth 2 (two) times daily. 60 tablet 11  . Coenzyme Q10 (CO Q 10) 100 MG CAPS Take 1 capsule by mouth daily.     No current facility-administered medications for this visit.      Past Medical History:  Diagnosis Date  . CHF (congestive heart failure) ([place])   . History of alcohol abuse   . Hyperlipidemia   . Nonischemic cardiomyopathy ([place])    Presumed to be tachycardia mediated  . Pacemaker 2012  . Persistent atrial fibrillation 2012  . Snores     ROS:   All systems reviewed and negative except as noted in the HPI.   Past Surgical History:  Procedure Laterality Date  . APPENDECTOMY     S/P  . CARDIAC CATHETERIZATION  11/2009   Normal coronary arteries   . PACEMAKER INSERTION  2012   [facility]     Family History  Problem Relation Age of Onset  . Heart attack Father 42       MI  . Colon cancer Neg Hx      Social History   Socioeconomic History  . Marital status: Divorced    Spouse name: Not on file  . Number of children: 2  . Years of education: Not on file  . Highest education level: Not on file  Occupational History  . Occupation: Works in Scientist, clinical (histocompatibility and immunogenetics): [employer]  . Financial resource strain: Not on file  . Food insecurity    Worry: Not on file    Inability: Not on file   . Transportation needs    Medical: Not on file    Non-medical: Not on file  Tobacco Use  . Smoking status: Current Every Day Smoker    Packs/day: 0.25    Types: Cigarettes  . Smokeless tobacco: Never Used  Substance and Sexual Activity  . Alcohol use: Yes    Alcohol/week: 21.0 standard drinks    Types: 21 Standard drinks or equivalent per week    Comment: At least 3 beers a day  . Drug use: No  . Sexual activity: Not on file  Lifestyle  . Physical activity    Days per week: Not on file    Minutes per session: Not on file  . Stress: Not on file  Relationships  . Social Herbalist on phone: Not on file    Gets together: Not on file    Attends religious service: Not on file    Active member of club or organization: Not on file    Attends meetings of clubs or organizations: Not on file    Relationship status: Not on file  . Intimate partner violence    Fear of current or ex partner:  Not on file    Emotionally abused: Not on file    Physically abused: Not on file    Forced sexual activity: Not on file  Other Topics Concern  . Not on file  Social History Narrative   Lives in [place]   Separated   2 children, no grandchildren     BP 130/86   Pulse 70   Ht 5\' 10"  (1.778 m)   Wt 197 lb 3.2 oz (89.4 kg)   SpO2 96%   BMI 28.30 kg/m   Physical Exam:  Well appearing NAD HEENT: Unremarkable Neck:  No JVD, no thyromegally Lymphatics:  No adenopathy Back:  No CVA tenderness Lungs:  Clear with no wheezes HEART:  Regular rate rhythm, no murmurs, no rubs, no clicks Abd:  soft, positive bowel sounds, no organomegally, no rebound, no guarding Ext:  2 plus pulses, no edema, no cyanosis, no clubbing Skin:  No rashes no nodules Neuro:  CN II through XII intact, motor grossly intact  EKG -  Atrial fib with a controlled VR  DEVICE  Normal device function.  See PaceArt for details.   Assess/Plan: 1. Atrial fib - his rates are well controlled. He will restart  his eliquis. I have encouraged him to remain complaint.  2. Chest pain - the symptoms have resolved. I suspect he had esophagitis due to excess ETOH. I have asked him to reduce his ETOH consumption. 3. HTN - his blood pressure is mostly well controlled. 4. CHB/PPM - he is s/p ablation and his PPM and his VR are working normally/well controlled.   Mike Bean.D.

## 2018-12-25 NOTE — Patient Instructions (Signed)
Medication Instructions:  Your physician recommends that you continue on your current medications as directed. Please refer to the Current Medication list given to you today.  Labwork: None ordered.  Testing/Procedures: None ordered.  Follow-Up: Your physician wants you to follow-up in: one year with Dr. Lovena Le.   You will receive a reminder letter in the mail two months in advance. If you don't receive a letter, please call our office to schedule the follow-up appointment.  Remote monitoring is used to monitor your Pacemaker from home. This monitoring reduces the number of office visits required to check your device to one time per year. It allows [place] to keep an eye on the functioning of your device to ensure it is working properly. You are scheduled for a device check from home on 01/01/2019. You may send your transmission at any time that day. If you have a wireless device, the transmission will be sent automatically. After your physician reviews your transmission, you will receive a postcard with your next transmission date.  Any Other Special Instructions Will Be Listed Below (If Applicable).  If you need a refill on your cardiac medications before your next appointment, please call your pharmacy.

## 2019-01-01 ENCOUNTER — Ambulatory Visit (INDEPENDENT_AMBULATORY_CARE_PROVIDER_SITE_OTHER): Payer: PRIVATE HEALTH INSURANCE | Admitting: *Deleted

## 2019-01-01 DIAGNOSIS — I5022 Chronic systolic (congestive) heart failure: Secondary | ICD-10-CM | POA: Diagnosis not present

## 2019-01-06 LAB — CUP PACEART REMOTE DEVICE CHECK
Battery Remaining Longevity: 19 mo
Battery Remaining Percentage: 19 %
Battery Voltage: 2.77 V
Brady Statistic RV Percent Paced: 99 %
Date Time Interrogation Session: 20200906172218
Implantable Lead Implant Date: 20120228
Implantable Lead Implant Date: 20120228
Implantable Lead Implant Date: 20120228
Implantable Lead Location: 753858
Implantable Lead Location: 753859
Implantable Lead Location: 753860
Implantable Lead Model: 4196
Implantable Pulse Generator Implant Date: 20120228
Lead Channel Impedance Value: 530 Ohm
Lead Channel Impedance Value: 750 Ohm
Lead Channel Pacing Threshold Amplitude: 0.75 V
Lead Channel Pacing Threshold Amplitude: 1.625 V
Lead Channel Pacing Threshold Pulse Width: 0.5 ms
Lead Channel Pacing Threshold Pulse Width: 0.5 ms
Lead Channel Setting Pacing Amplitude: 2 V
Lead Channel Setting Pacing Amplitude: 2.625
Lead Channel Setting Pacing Pulse Width: 0.5 ms
Lead Channel Setting Pacing Pulse Width: 0.5 ms
Lead Channel Setting Sensing Sensitivity: 2 mV
Pulse Gen Model: 3210
Pulse Gen Serial Number: 2485643

## 2019-01-15 ENCOUNTER — Encounter: Payer: Self-pay | Admitting: Cardiology

## 2019-01-15 NOTE — Progress Notes (Signed)
Remote pacemaker transmission.   

## 2019-04-02 ENCOUNTER — Ambulatory Visit (INDEPENDENT_AMBULATORY_CARE_PROVIDER_SITE_OTHER): Payer: PRIVATE HEALTH INSURANCE | Admitting: *Deleted

## 2019-04-02 DIAGNOSIS — Z950 Presence of cardiac pacemaker: Secondary | ICD-10-CM

## 2019-04-02 LAB — CUP PACEART REMOTE DEVICE CHECK
Battery Remaining Longevity: 14 mo
Battery Remaining Percentage: 15 %
Battery Voltage: 2.74 V
Date Time Interrogation Session: 20201203065155
Implantable Lead Implant Date: 20120228
Implantable Lead Implant Date: 20120228
Implantable Lead Implant Date: 20120228
Implantable Lead Location: 753858
Implantable Lead Location: 753859
Implantable Lead Location: 753860
Implantable Lead Model: 4196
Implantable Pulse Generator Implant Date: 20120228
Lead Channel Impedance Value: 490 Ohm
Lead Channel Impedance Value: 910 Ohm
Lead Channel Pacing Threshold Amplitude: 0.75 V
Lead Channel Pacing Threshold Amplitude: 1.25 V
Lead Channel Pacing Threshold Pulse Width: 0.5 ms
Lead Channel Pacing Threshold Pulse Width: 0.5 ms
Lead Channel Sensing Intrinsic Amplitude: 12 mV
Lead Channel Setting Pacing Amplitude: 2 V
Lead Channel Setting Pacing Amplitude: 2.25 V
Lead Channel Setting Pacing Pulse Width: 0.5 ms
Lead Channel Setting Pacing Pulse Width: 0.5 ms
Lead Channel Setting Sensing Sensitivity: 2 mV
Pulse Gen Model: 3210
Pulse Gen Serial Number: 2485643

## 2019-04-20 ENCOUNTER — Ambulatory Visit: Payer: PRIVATE HEALTH INSURANCE | Attending: Internal Medicine

## 2019-04-20 DIAGNOSIS — Z20822 Contact with and (suspected) exposure to covid-19: Secondary | ICD-10-CM

## 2019-04-21 LAB — NOVEL CORONAVIRUS, NAA: SARS-CoV-2, NAA: NOT DETECTED

## 2019-04-28 NOTE — Progress Notes (Signed)
PPM remote 

## 2019-05-11 LAB — CUP PACEART INCLINIC DEVICE CHECK
Date Time Interrogation Session: 20200827164342
Implantable Lead Implant Date: 20120228
Implantable Lead Implant Date: 20120228
Implantable Lead Implant Date: 20120228
Implantable Lead Location: 753858
Implantable Lead Location: 753859
Implantable Lead Location: 753860
Implantable Lead Model: 4196
Implantable Pulse Generator Implant Date: 20120228
Pulse Gen Model: 3210
Pulse Gen Serial Number: 2485643

## 2019-05-15 ENCOUNTER — Ambulatory Visit: Payer: PRIVATE HEALTH INSURANCE | Attending: Internal Medicine

## 2019-05-15 ENCOUNTER — Other Ambulatory Visit: Payer: Self-pay

## 2019-05-15 DIAGNOSIS — Z20822 Contact with and (suspected) exposure to covid-19: Secondary | ICD-10-CM

## 2019-05-16 LAB — NOVEL CORONAVIRUS, NAA: SARS-CoV-2, NAA: NOT DETECTED

## 2019-05-22 ENCOUNTER — Ambulatory Visit: Payer: PRIVATE HEALTH INSURANCE | Attending: Internal Medicine

## 2019-05-22 DIAGNOSIS — Z20822 Contact with and (suspected) exposure to covid-19: Secondary | ICD-10-CM

## 2019-05-23 LAB — NOVEL CORONAVIRUS, NAA: SARS-CoV-2, NAA: NOT DETECTED

## 2019-06-09 ENCOUNTER — Telehealth: Payer: Self-pay

## 2019-06-09 ENCOUNTER — Encounter (HOSPITAL_COMMUNITY): Payer: Self-pay | Admitting: Emergency Medicine

## 2019-06-09 ENCOUNTER — Emergency Department (HOSPITAL_COMMUNITY): Payer: PRIVATE HEALTH INSURANCE

## 2019-06-09 ENCOUNTER — Other Ambulatory Visit: Payer: Self-pay

## 2019-06-09 ENCOUNTER — Emergency Department (HOSPITAL_COMMUNITY)
Admission: EM | Admit: 2019-06-09 | Discharge: 2019-06-09 | Disposition: A | Discharge status: Home / Self Care | Payer: PRIVATE HEALTH INSURANCE | Attending: Emergency Medicine | Admitting: Emergency Medicine

## 2019-06-09 DIAGNOSIS — Z950 Presence of cardiac pacemaker: Secondary | ICD-10-CM | POA: Diagnosis not present

## 2019-06-09 DIAGNOSIS — I509 Heart failure, unspecified: Secondary | ICD-10-CM | POA: Insufficient documentation

## 2019-06-09 DIAGNOSIS — F17210 Nicotine dependence, cigarettes, uncomplicated: Secondary | ICD-10-CM | POA: Insufficient documentation

## 2019-06-09 DIAGNOSIS — Z79899 Other long term (current) drug therapy: Secondary | ICD-10-CM | POA: Insufficient documentation

## 2019-06-09 DIAGNOSIS — R0789 Other chest pain: Secondary | ICD-10-CM | POA: Diagnosis present

## 2019-06-09 DIAGNOSIS — J449 Chronic obstructive pulmonary disease, unspecified: Secondary | ICD-10-CM | POA: Insufficient documentation

## 2019-06-09 LAB — BASIC METABOLIC PANEL
Anion gap: 8 (ref 5–15)
BUN: 13 mg/dL (ref 8–23)
CO2: 24 mmol/L (ref 22–32)
Calcium: 8.6 mg/dL — ABNORMAL LOW (ref 8.9–10.3)
Chloride: 102 mmol/L (ref 98–111)
Creatinine, Ser: 1.21 mg/dL (ref 0.61–1.24)
GFR calc Af Amer: >60 mL/min (ref >60)
GFR calc non Af Amer: >60 mL/min (ref >60)
Glucose, Bld: 149 mg/dL — ABNORMAL HIGH (ref 70–99)
Potassium: 4.2 mmol/L (ref 3.5–5.1)
Sodium: 134 mmol/L — ABNORMAL LOW (ref 135–145)

## 2019-06-09 LAB — CBC
HCT: 52.8 % — ABNORMAL HIGH (ref 39.0–52.0)
Hemoglobin: 17.9 g/dL — ABNORMAL HIGH (ref 13.0–17.0)
MCH: 32.3 pg (ref 26.0–34.0)
MCHC: 33.9 g/dL (ref 30.0–36.0)
MCV: 95.1 fL (ref 80.0–100.0)
Platelets: 175 10*3/uL (ref 150–400)
RBC: 5.55 MIL/uL (ref 4.22–5.81)
RDW: 12.3 % (ref 11.5–15.5)
WBC: 9.6 10*3/uL (ref 4.0–10.5)
nRBC: 0 % (ref 0.0–0.2)

## 2019-06-09 LAB — HEPATIC FUNCTION PANEL
ALT: 16 U/L (ref 0–44)
AST: 23 U/L (ref 15–41)
Albumin: 3.6 g/dL (ref 3.5–5.0)
Alkaline Phosphatase: 51 U/L (ref 38–126)
Bilirubin, Direct: 0.3 mg/dL — ABNORMAL HIGH (ref 0.0–0.2)
Indirect Bilirubin: 1.5 mg/dL — ABNORMAL HIGH (ref 0.3–0.9)
Total Bilirubin: 1.8 mg/dL — ABNORMAL HIGH (ref 0.3–1.2)
Total Protein: 7 g/dL (ref 6.5–8.1)

## 2019-06-09 LAB — TROPONIN I (HIGH SENSITIVITY)
Troponin I (High Sensitivity): 12 ng/L (ref <18)
Troponin I (High Sensitivity): 10 ng/L (ref <18)

## 2019-06-09 LAB — ETHANOL: Alcohol, Ethyl (B): <10 mg/dL (ref <10)

## 2019-06-09 LAB — LIPASE, BLOOD: Lipase: 38 U/L (ref 11–51)

## 2019-06-09 MED ORDER — SODIUM CHLORIDE 0.9% FLUSH: 3.0000 mL | Freq: Once | INTRAVENOUS | Status: DC

## 2019-06-09 MED ORDER — ACETAMINOPHEN 500 MG PO TABS
1000.0000 mg | ORAL_TABLET | Freq: Once | ORAL | Status: AC
  Administered 2019-06-09: 1000 mg via ORAL
  Filled 2019-06-09: qty 2

## 2019-06-09 MED ORDER — ALBUTEROL SULFATE (2.5 MG/3ML) 0.083% IN NEBU: 2.5000 mg | INHALATION_SOLUTION | Freq: Four times a day (QID) | RESPIRATORY_TRACT | 12 refills | Status: AC | PRN

## 2019-06-09 MED ORDER — LORAZEPAM 2 MG/ML IJ SOLN
0.5000 mg | Freq: Once | INTRAMUSCULAR | Status: AC
  Administered 2019-06-09: 0.5 mg via INTRAVENOUS
  Filled 2019-06-09: qty 1

## 2019-06-09 MED ORDER — ALBUTEROL SULFATE HFA 108 (90 BASE) MCG/ACT IN AERS
2.0000 | INHALATION_SPRAY | Freq: Four times a day (QID) | RESPIRATORY_TRACT | Status: DC
  Filled 2019-06-09: qty 6.7

## 2019-06-09 MED ORDER — PREDNISONE 20 MG PO TABS: 40.0000 mg | ORAL_TABLET | Freq: Every day | ORAL | 0 refills | Status: DC

## 2019-06-09 MED ORDER — SODIUM CHLORIDE 0.9 % IV BOLUS
1000.0000 mL | Freq: Once | INTRAVENOUS | Status: AC
  Administered 2019-06-09: 1000 mL via INTRAVENOUS

## 2019-06-09 NOTE — Telephone Encounter (Signed)
Transmission reviewed. Normal CRT-P function. BiVP >99%. Presenting rhythm BiVP @ 79bpm (atrial lead off due persistent/permanent AF). No high ventricular rate episodes. Histograms appropriate. Lead trends stable. Routed to triage.

## 2019-06-09 NOTE — Discharge Instructions (Signed)
As discussed, your evaluation today has been largely reassuring.  But, it is important that you monitor your condition carefully, and do not hesitate to return to the ED if you develop new, or concerning changes in your condition. ? ?Otherwise, please follow-up with your physician for appropriate ongoing care. ? ?

## 2019-06-09 NOTE — ED Notes (Signed)
Patient verbalizes understanding of discharge instructions. Opportunity for questioning and answers were provided. Armband removed by staff, pt discharged from ED ambulatory with friend to pick him up

## 2019-06-09 NOTE — ED Provider Notes (Signed)
[facility] Provider Note   CSN: [id number] Arrival date & time: 06/09/19  1506     History Chief Complaint  Patient presents with  . Chest Pain    Mike Bean is a 59 y.o. male.  HPI    Patient presents with concern of left-sided chest pain. Pain is sharp, in the left upper chest, nonradiating with associated weakness, but no other focal pain.  Onset was yesterday, without clear precipitant. Since that time the pain is been persistent. He does have history of cardiomyopathy, pacemaker placement.  Today, with his symptoms he called the pacemaker company, had a remote interrogation performed, which was reportedly unremarkable. However, given ongoing pain he was sent here for evaluation. He does have associated mild headache, but no focal weakness, no nausea, vomiting.   Past Medical History:  Diagnosis Date  . CHF (congestive heart failure) ([place])   . History of alcohol abuse   . Hyperlipidemia   . Nonischemic cardiomyopathy ([place])    Presumed to be tachycardia mediated  . Pacemaker 2012  . Persistent atrial fibrillation ([place]) 2012  . Snores     Patient Active Problem List   Diagnosis Date Noted  . Pacemaker 12/25/2018  . ALCOHOLIC CARDIOMYOPATHY [id number]  . SNORING 04/19/2010  . ATRIAL FIBRILLATION 04/18/2010  . Congestive heart failure ([place]) 04/18/2010  . COPD 04/18/2010    Past Surgical History:  Procedure Laterality Date  . APPENDECTOMY     S/P  . CARDIAC CATHETERIZATION  11/2009   Normal coronary arteries   . PACEMAKER INSERTION  2012   st jude       Family History  Problem Relation Age of Onset  . Heart attack Father 69       MI  . Colon cancer Neg Hx     Social History   Tobacco Use  . Smoking status: Current Every Day Smoker    Packs/day: 0.25    Types: Cigarettes  . Smokeless tobacco: Never Used  Substance Use Topics  . Alcohol use: Yes    Alcohol/week: 21.0 standard drinks    Types: 21  Standard drinks or equivalent per week    Comment: At least 3 beers a day  . Drug use: No    Home Medications Prior to Admission medications   Medication Sig Start Date End Date Taking? Authorizing Provider  apixaban (ELIQUIS) 5 MG TABS tablet Take 1 tablet (5 mg total) by mouth 2 (two) times daily. Patient taking differently: Take 5 mg by mouth daily.  12/25/18  Yes Evans Lance, MD  Coenzyme Q10 (CO Q 10) 100 MG CAPS Take 1 capsule by mouth daily.   Yes [provider]  predniSONE (DELTASONE) 20 MG tablet Take 2 tablets (40 mg total) by mouth daily with breakfast. For the next four days 06/09/19   Carmin Muskrat, MD    Allergies    Patient has no known allergies.  Review of Systems   Review of Systems  Constitutional:       Per HPI, otherwise negative  HENT:       Per HPI, otherwise negative  Respiratory:       Per HPI, otherwise negative  Cardiovascular:       Per HPI, otherwise negative  Gastrointestinal: Negative for vomiting.  Endocrine:       Negative aside from HPI  Genitourinary:       Neg aside from HPI   Musculoskeletal:       Per HPI,  otherwise negative  Skin: Negative.   Neurological: Positive for headaches. Negative for syncope.    Physical Exam Updated Vital Signs BP (!) 153/108   Pulse 70   Temp 98.3 F (36.8 C) (Oral)   Resp 19   SpO2 94%   Physical Exam Vitals and nursing note reviewed.  Constitutional:      General: He is not in acute distress.    Appearance: He is well-developed.  HENT:     Head: Normocephalic and atraumatic.  Eyes:     Conjunctiva/sclera: Conjunctivae normal.  Cardiovascular:     Rate and Rhythm: Normal rate and regular rhythm.  Pulmonary:     Effort: Pulmonary effort is normal. No respiratory distress.     Breath sounds: No stridor.  Abdominal:     General: There is no distension.  Skin:    General: Skin is warm and dry.  Neurological:     Mental Status: He is alert and oriented to person, place, and  time.     ED Results / Procedures / Treatments   Labs (all labs ordered are listed, but only abnormal results are displayed) Labs Reviewed  BASIC METABOLIC PANEL - Abnormal; Notable for the following components:      Result Value   Sodium 134 (*)    Glucose, Bld 149 (*)    Calcium 8.6 (*)    All other components within normal limits  CBC - Abnormal; Notable for the following components:   Hemoglobin 17.9 (*)    HCT 52.8 (*)    All other components within normal limits  HEPATIC FUNCTION PANEL - Abnormal; Notable for the following components:   Total Bilirubin 1.8 (*)    Bilirubin, Direct 0.3 (*)    Indirect Bilirubin 1.5 (*)    All other components within normal limits  ETHANOL  LIPASE, BLOOD  TROPONIN I (HIGH SENSITIVITY)  TROPONIN I (HIGH SENSITIVITY)    EKG EKG Interpretation  Date/Time:  Tuesday June 09 2019 15:16:36 EST Ventricular Rate:  70 PR Interval:    QRS Duration: 124 QT Interval:  466 QTC Calculation: 503 R Axis:   -117 Text Interpretation: Ventricular-paced rhythm Biventricular pacemaker detected Abnormal ECG Abnormal ECG Confirmed by Carmin Muskrat [phone]) on 06/09/2019 4:26:59 PM  Initial cardiac rhythm on monitor paced 70 abnormal  Radiology DG Chest 2 View  Result Date: 06/09/2019 CLINICAL DATA:  Sharp nonradiating left-sided chest pain for 1 day, fatigue EXAM: CHEST - 2 VIEW COMPARISON:  06/30/2018 FINDINGS: Frontal and lateral views of the chest demonstrates stable multi lead pacemaker. Cardiac silhouette is unremarkable. Diffuse interstitial prominence is unchanged, likely related to chronic tobacco abuse. No airspace disease, effusion, or pneumothorax. There are subacute right anterolateral seventh and eighth rib fractures with moderate callus formation. No other acute bony abnormalities. IMPRESSION: 1. No acute cardiopulmonary findings. 2. Subacute right anterolateral seventh and eighth rib fractures with moderate callus formation. Electronically  Signed   By: Randa Ngo M.D.   On: 06/09/2019 15:50    Procedures Procedures (including critical care time)  Medications Ordered in ED Medications  sodium chloride flush (NS) 0.9 % injection 3 mL (has no administration in time range)  albuterol (VENTOLIN HFA) 108 (90 Base) MCG/ACT inhaler 2 puff (has no administration in time range)  sodium chloride 0.9 % bolus 1,000 mL (0 mLs Intravenous Stopped 06/09/19 1819)  LORazepam (ATIVAN) injection 0.5 mg (0.5 mg Intravenous Given 06/09/19 1702)  acetaminophen (TYLENOL) tablet 1,000 mg (1,000 mg Oral Given 06/09/19 1702)  ED Course  I have reviewed the triage vital signs and the nursing notes.  Pertinent labs & imaging results that were available during my care of the patient were reviewed by me and considered in my medical decision making (see chart for details).   Review after initial visit notable for history of cardiomyopathy, implanted pacemaker, notation of possible medication noncompliance and alcohol abuse.  Rhythm check monitor today reviewed, as below: Transmission reviewed. Normal CRT-P function. BiVP >99%. Presenting rhythm BiVP @ 79bpm (atrial lead off due persistent/permanent AF). No high ventricular rate episodes. Histograms appropriate. Lead trends stable. Routed to triage.     MDM Rules/Calculators/A&P                     On monitor the patient is now with paced 80 unremarkable 8:32 PM Patient sleeping on repeat exam, but awakens easily.  We reviewed all findings, reassuring, 2 normal troponin, slight elevation in liver enzymes, that is consistent with prior. He has no distress, no evidence for hemodynamic compromise, no evidence for ACS, PE, pneumothorax With his smoking history, soreness, some suspicion for COPD contributing to his presentation, patient minimal to initiation of medications, follow-up with primary care.  Final Clinical Impression(s) / ED Diagnoses Final diagnoses:  Atypical chest pain    Rx / DC  Orders ED Discharge Orders         Ordered    predniSONE (DELTASONE) 20 MG tablet  Daily with breakfast     06/09/19 2031           Carmin Muskrat, MD 06/09/19 2032

## 2019-06-09 NOTE — ED Triage Notes (Signed)
Pt states he has been having sharp non radiating left sided cp since yesterday, feeling more tired than normal. Pt has pacemaker- had it evaluated today with no remarkable events. Pt was advised to come to ED for further eval of CP.

## 2019-06-09 NOTE — Telephone Encounter (Signed)
I spoke to the patient who is experiencing CP with SOB.  His pain is radiating down his left arm and into his hands.    I encouraged him to call EMS and get transported to the ED for further evaluation.  He verbalized understanding.

## 2019-06-09 NOTE — Telephone Encounter (Signed)
The pt states he is having chest pains. The pt states he is having some lightheadedness. The pain is right at his heart. The pt has not taking any medications for the chest pains. The nurse Raquel Sarna reviewed the transmission and it is normal. I transferred the call to triage.

## 2019-06-09 NOTE — ED Notes (Signed)
Pt resting with eyes closed at this time.  No complaints voiced. 

## 2019-06-16 NOTE — Progress Notes (Signed)
Electrophysiology Office Note Date: 06/17/2019  ID:  Mike Bean, DOB Jan 08, 1961, MRN [id number]  PCP: Patient, No Pcp Per Electrophysiologist: Dr. Lovena Le   CC: Pacemaker follow-up  Mike Bean is a 59 y.o. male seen today for Dr. Lovena Le . he presents today for post ED followup for chest pain. Work up unremarkable with normal troponins, slightly elevated LFTs (stable) and CXR that showed older rib fractures. Since being seen he has not had any further. On occasion, he will binge drink. He has 5+ beers and 2-3 shots 3+ days a week. Continues to smoke. Admits to dietary sodium indiscretion.  he denies exertional chest pain, palpitations, dyspnea, PND, orthopnea, nausea, vomiting, dizziness, syncope, edema, weight gain, or early satiety.  Device History: St. Jude BiV PPM implanted 2012 for CHB  Past Medical History:  Diagnosis Date  . CHF (congestive heart failure) ([place])   . History of alcohol abuse   . Hyperlipidemia   . Nonischemic cardiomyopathy ([place])    Presumed to be tachycardia mediated  . Pacemaker 2012  . Persistent atrial fibrillation ([place]) 2012  . Snores    Past Surgical History:  Procedure Laterality Date  . APPENDECTOMY     S/P  . CARDIAC CATHETERIZATION  11/2009   Normal coronary arteries   . PACEMAKER INSERTION  2012   st jude    Current Outpatient Medications  Medication Sig Dispense Refill  . albuterol (PROVENTIL) (2.5 MG/3ML) 0.083% nebulizer solution Take 3 mLs (2.5 mg total) by nebulization every 6 (six) hours as needed for wheezing or shortness of breath. 75 mL 12  . apixaban (ELIQUIS) 5 MG TABS tablet Take 5 mg by mouth daily.    . Coenzyme Q10 (CO Q 10) 100 MG CAPS Take 1 capsule by mouth daily.    . Omega-3 Fatty Acids (FISH OIL CONCENTRATE) 300 MG CAPS Take by mouth daily.    . furosemide (LASIX) 40 MG tablet Take 1 tablet (40 mg total) by mouth as needed. 90 tablet 3   No current facility-administered medications for this visit.    Allergies:    Patient has no known allergies.   Social History: Social History   Socioeconomic History  . Marital status: Divorced    Spouse name: Not on file  . Number of children: 2  . Years of education: Not on file  . Highest education level: Not on file  Occupational History  . 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 Smoking status: Current Every Day Smoker    Packs/day: 0.25    Types: Cigarettes  . Smokeless tobacco: Never Used  Substance and Sexual Activity  . Alcohol use: Yes    Alcohol/week: 21.0 standard drinks    Types: 21 Standard drinks or equivalent per week    Comment: At least 3 beers a day  . Drug use: No  . Sexual activity: Not on file  Other Topics Concern  . Not on file  Social History Narrative   Lives in [place]   Separated   2 children, no grandchildren   Social Determinants of Health   Financial Resource Strain:   . Difficulty of Paying Living Expenses: Not on file  Food Insecurity:   . Worried About Charity fundraiser in the Last Year: Not on file  . Ran Out of Food in the Last Year: Not on file  Transportation Needs:   . Lack of Transportation (Medical): Not on file  . Lack  of Transportation (Non-Medical): Not on file  Physical Activity:   . Days of Exercise per Week: Not on file  . Minutes of Exercise per Session: Not on file  Stress:   . Feeling of Stress : Not on file  Social Connections:   . Frequency of Communication with Friends and Family: Not on file  . Frequency of Social Gatherings with Friends and Family: Not on file  . Attends Religious Services: Not on file  . Active Member of Clubs or Organizations: Not on file  . Attends Archivist Meetings: Not on file  . Marital Status: Not on file  Intimate Partner Violence:   . Fear of Current or Ex-Partner: Not on file  . Emotionally Abused: Not on file  . Physically Abused: Not on file  . Sexually Abused: Not on file    Family History:  Family History  Problem Relation Age of Onset  . Heart attack Father 34       MI  . Colon cancer Neg Hx      Review of Systems: All other systems reviewed and are otherwise negative except as noted above.  Physical Exam: Vitals:   06/17/19 0905  BP: 120/88  Pulse: 92  SpO2: 99%  Weight: 208 lb (94.3 kg)  Height: 5\' 10"  (1.778 m)     GEN- The patient is well appearing, alert and oriented x 3 today.   HEENT: normocephalic, atraumatic; sclera clear, conjunctiva pink; hearing intact; oropharynx clear; neck supple  Lungs- Clear to ausculation bilaterally, normal work of breathing.  No wheezes, rales, rhonchi Heart- Regular rate and rhythm, no murmurs, rubs or gallops  GI- soft, non-tender, non-distended, bowel sounds present  Extremities- no clubbing, cyanosis, or edema  MS- no significant deformity or atrophy Skin- warm and dry, no rash or lesion; PPM pocket well healed Psych- euthymic mood, full affect Neuro- strength and sensation are intact  PPM Interrogation- reviewed in detail today,  See PACEART report  EKG:  EKG is not ordered today. The ekg ordered 06/09/2019 shows V paced rhythm at 70 bpm with underlying AF.   Recent Labs: 06/30/2018: B Natriuretic Peptide 79.0 06/09/2019: ALT 16; BUN 13; Creatinine, Ser 1.21; Hemoglobin 17.9; Platelets 175; Potassium 4.2; Sodium 134   Wt Readings from Last 3 Encounters:  06/17/19 208 lb (94.3 kg)  12/25/18 197 lb 3.2 oz (89.4 kg)  08/09/18 215 lb (97.5 kg)     Other studies Reviewed: Additional studies/ records that were reviewed today include: CXR from 06/09/2019 personally reviewed. PPM placement and leads appear stable compared to previous (06/2018, also personally reviewed), Echo 05/2012 shows LVEF 35-40%, ED office notes, Previous EP office notes, Previous remote checks, Most recent labwork.   Assessment and Plan:  1.  CHB s/p St. Jude PPM  Normal PPM function See Pace Art report No changes today  2. Atrial fibrillation  Rates are well controlled Continue Eliquis for CHA2DS2VASC of at least 2  (CHF/HTN)  3. HTN Continue current medications  4. Chest pain This has happened in the past and suspected esophagitis due to excess ETOH. Encouraged alcohol use in moderation only.  Work up in ED was negative. He had clean cath in 2011 but has continued to smoke and has other risk factors. Has been told to call with any anginal/exertional chest pain symptoms.  Encouraged smoking cessation.  Encouraged sodium restriction, and also given 40 mg lasix tablets to use as needed today.    Current medicines are reviewed at length with the  patient today.   The patient does not have concerns regarding his medicines.  The following changes were made today:  Lasix 40 mg prn added.  Labs/ tests ordered today include:  Orders Placed This Encounter  Procedures  . CUP PACEART INCLINIC DEVICE CHECK   Disposition:   Follow up with Dr. Lovena Le in 12 months. Continue remotes q 3 months.   Jacalyn Lefevre, PA-C  06/17/2019 10:25 AM  [address] [place] [address] [phone] (office) [phone] (fax)

## 2019-06-17 ENCOUNTER — Ambulatory Visit (INDEPENDENT_AMBULATORY_CARE_PROVIDER_SITE_OTHER): Payer: PRIVATE HEALTH INSURANCE | Admitting: Student

## 2019-06-17 ENCOUNTER — Encounter: Payer: Self-pay | Admitting: Student

## 2019-06-17 ENCOUNTER — Other Ambulatory Visit: Payer: Self-pay

## 2019-06-17 VITALS — BP 120/88 | HR 92 | Ht 70.0 in | Wt 208.0 lb

## 2019-06-17 DIAGNOSIS — I5022 Chronic systolic (congestive) heart failure: Secondary | ICD-10-CM

## 2019-06-17 DIAGNOSIS — I4891 Unspecified atrial fibrillation: Secondary | ICD-10-CM | POA: Diagnosis not present

## 2019-06-17 DIAGNOSIS — R079 Chest pain, unspecified: Secondary | ICD-10-CM

## 2019-06-17 DIAGNOSIS — Z950 Presence of cardiac pacemaker: Secondary | ICD-10-CM

## 2019-06-17 DIAGNOSIS — I4811 Longstanding persistent atrial fibrillation: Secondary | ICD-10-CM

## 2019-06-17 DIAGNOSIS — I429 Cardiomyopathy, unspecified: Secondary | ICD-10-CM

## 2019-06-17 LAB — CUP PACEART INCLINIC DEVICE CHECK
Battery Remaining Longevity: 8 mo
Battery Voltage: 2.69 V
Brady Statistic RA Percent Paced: 0 %
Brady Statistic RV Percent Paced: 99.78 %
Date Time Interrogation Session: 20210217094340
Implantable Lead Implant Date: 20120228
Implantable Lead Implant Date: 20120228
Implantable Lead Implant Date: 20120228
Implantable Lead Location: 753858
Implantable Lead Location: 753859
Implantable Lead Location: 753860
Implantable Lead Model: 4196
Implantable Pulse Generator Implant Date: 20120228
Lead Channel Impedance Value: 512.5 Ohm
Lead Channel Impedance Value: 750 Ohm
Lead Channel Pacing Threshold Amplitude: 0.75 V
Lead Channel Pacing Threshold Amplitude: 1.125 V
Lead Channel Pacing Threshold Pulse Width: 0.5 ms
Lead Channel Pacing Threshold Pulse Width: 0.5 ms
Lead Channel Sensing Intrinsic Amplitude: 12 mV
Lead Channel Setting Pacing Amplitude: 2 V
Lead Channel Setting Pacing Amplitude: 2.125
Lead Channel Setting Pacing Pulse Width: 0.5 ms
Lead Channel Setting Pacing Pulse Width: 0.5 ms
Lead Channel Setting Sensing Sensitivity: 2 mV
Pulse Gen Model: 3210
Pulse Gen Serial Number: 2485643

## 2019-06-17 MED ORDER — FUROSEMIDE 40 MG PO TABS: 40.0000 mg | ORAL_TABLET | ORAL | 3 refills | Status: DC | PRN

## 2019-06-17 NOTE — Patient Instructions (Addendum)
Medication Instructions:  START LASIX 40 mg as needed.  If you gain 3 lbs in 1 day or 5 lbs in 1 week. *If you need a refill on your cardiac medications before your next appointment, please call your pharmacy*  Lab Work: none If you have labs (blood work) drawn today and your tests are completely normal, you will receive your results only by: Marland Kitchen MyChart Message (if you have MyChart) OR . A paper copy in the mail If you have any lab test that is abnormal or we need to change your treatment, we will call you to review the results.  Testing/Procedures: none  Follow-Up: At [facility], you and your health needs are our priority.  As part of our continuing mission to provide you with exceptional heart care, we have created designated Provider Care Teams.  These Care Teams include your primary Cardiologist (physician) and Advanced Practice Providers (APPs -  Physician Assistants and Nurse Practitioners) who all work together to provide you with the care you need, when you need it.  Other Instructions Remote monitoring is used to monitor your Pacemaker  from home. This monitoring reduces the number of office visits required to check your device to one time per year. It allows [place] to keep an eye on the functioning of your device to ensure it is working properly. You are scheduled for a device check from home on 07/02/19. You may send your transmission at any time that day. If you have a wireless device, the transmission will be sent automatically. After your physician reviews your transmission, you will receive a postcard with your next transmission date.

## 2019-07-15 ENCOUNTER — Ambulatory Visit (INDEPENDENT_AMBULATORY_CARE_PROVIDER_SITE_OTHER): Payer: PRIVATE HEALTH INSURANCE | Admitting: *Deleted

## 2019-07-15 DIAGNOSIS — Z950 Presence of cardiac pacemaker: Secondary | ICD-10-CM | POA: Diagnosis not present

## 2019-07-15 LAB — CUP PACEART REMOTE DEVICE CHECK
Battery Remaining Longevity: 8 mo
Battery Remaining Percentage: 8 %
Battery Voltage: 2.69 V
Date Time Interrogation Session: 20210317042531
Implantable Lead Implant Date: 20120228
Implantable Lead Implant Date: 20120228
Implantable Lead Implant Date: 20120228
Implantable Lead Location: 753858
Implantable Lead Location: 753859
Implantable Lead Location: 753860
Implantable Lead Model: 4196
Implantable Pulse Generator Implant Date: 20120228
Lead Channel Impedance Value: 450 Ohm
Lead Channel Impedance Value: 710 Ohm
Lead Channel Pacing Threshold Amplitude: 0.75 V
Lead Channel Pacing Threshold Amplitude: 1.375 V
Lead Channel Pacing Threshold Pulse Width: 0.5 ms
Lead Channel Pacing Threshold Pulse Width: 0.5 ms
Lead Channel Sensing Intrinsic Amplitude: 12 mV
Lead Channel Setting Pacing Amplitude: 2 V
Lead Channel Setting Pacing Amplitude: 2.375
Lead Channel Setting Pacing Pulse Width: 0.5 ms
Lead Channel Setting Pacing Pulse Width: 0.5 ms
Lead Channel Setting Sensing Sensitivity: 2 mV
Pulse Gen Model: 3210
Pulse Gen Serial Number: 2485643

## 2019-07-16 ENCOUNTER — Telehealth: Payer: Self-pay | Admitting: Student

## 2019-07-16 NOTE — Progress Notes (Signed)
PPM Remote  

## 2019-07-16 NOTE — Telephone Encounter (Signed)
Pt sent staff message to ask about his Pacer check.   LMOM to CB device clinic

## 2019-07-16 NOTE — Telephone Encounter (Signed)
Pt was calling to clarify specificity of the timing of his remote checks. Informed him that he can send his transmission any time on the scheduled day. No need to get up early.   No further questions. Legrand Como [address]" [place], PA-C  07/16/2019 2:24 PM

## 2019-08-16 LAB — CUP PACEART REMOTE DEVICE CHECK
Battery Remaining Longevity: 7 mo
Battery Remaining Percentage: 6 %
Battery Voltage: 2.68 V
Date Time Interrogation Session: 20210417043559
Implantable Lead Implant Date: 20120228
Implantable Lead Implant Date: 20120228
Implantable Lead Implant Date: 20120228
Implantable Lead Location: 753858
Implantable Lead Location: 753859
Implantable Lead Location: 753860
Implantable Lead Model: 4196
Implantable Pulse Generator Implant Date: 20120228
Lead Channel Impedance Value: 490 Ohm
Lead Channel Impedance Value: 830 Ohm
Lead Channel Pacing Threshold Amplitude: 0.75 V
Lead Channel Pacing Threshold Amplitude: 1.125 V
Lead Channel Pacing Threshold Pulse Width: 0.5 ms
Lead Channel Pacing Threshold Pulse Width: 0.5 ms
Lead Channel Sensing Intrinsic Amplitude: 12 mV
Lead Channel Setting Pacing Amplitude: 2 V
Lead Channel Setting Pacing Amplitude: 2.125
Lead Channel Setting Pacing Pulse Width: 0.5 ms
Lead Channel Setting Pacing Pulse Width: 0.5 ms
Lead Channel Setting Sensing Sensitivity: 2 mV
Pulse Gen Model: 3210
Pulse Gen Serial Number: 2485643

## 2019-08-17 ENCOUNTER — Ambulatory Visit (INDEPENDENT_AMBULATORY_CARE_PROVIDER_SITE_OTHER): Payer: PRIVATE HEALTH INSURANCE | Admitting: *Deleted

## 2019-08-17 DIAGNOSIS — Z950 Presence of cardiac pacemaker: Secondary | ICD-10-CM

## 2019-09-15 LAB — CUP PACEART REMOTE DEVICE CHECK
Battery Remaining Longevity: 5 mo
Battery Remaining Percentage: 5 %
Battery Voltage: 2.66 V
Date Time Interrogation Session: 20210518044629
Implantable Lead Implant Date: 20120228
Implantable Lead Implant Date: 20120228
Implantable Lead Implant Date: 20120228
Implantable Lead Location: 753858
Implantable Lead Location: 753859
Implantable Lead Location: 753860
Implantable Lead Model: 4196
Implantable Pulse Generator Implant Date: 20120228
Lead Channel Impedance Value: 510 Ohm
Lead Channel Impedance Value: 790 Ohm
Lead Channel Pacing Threshold Amplitude: 0.75 V
Lead Channel Pacing Threshold Amplitude: 1.375 V
Lead Channel Pacing Threshold Pulse Width: 0.5 ms
Lead Channel Pacing Threshold Pulse Width: 0.5 ms
Lead Channel Sensing Intrinsic Amplitude: 12 mV
Lead Channel Setting Pacing Amplitude: 2 V
Lead Channel Setting Pacing Amplitude: 2.375
Lead Channel Setting Pacing Pulse Width: 0.5 ms
Lead Channel Setting Pacing Pulse Width: 0.5 ms
Lead Channel Setting Sensing Sensitivity: 2 mV
Pulse Gen Model: 3210
Pulse Gen Serial Number: 2485643

## 2019-09-17 ENCOUNTER — Ambulatory Visit (INDEPENDENT_AMBULATORY_CARE_PROVIDER_SITE_OTHER): Payer: PRIVATE HEALTH INSURANCE | Admitting: *Deleted

## 2019-09-17 DIAGNOSIS — I5022 Chronic systolic (congestive) heart failure: Secondary | ICD-10-CM

## 2019-09-21 NOTE — Progress Notes (Signed)
Remote pacemaker transmission.   

## 2019-10-19 ENCOUNTER — Ambulatory Visit (INDEPENDENT_AMBULATORY_CARE_PROVIDER_SITE_OTHER): Payer: PRIVATE HEALTH INSURANCE | Admitting: *Deleted

## 2019-10-19 DIAGNOSIS — I5022 Chronic systolic (congestive) heart failure: Secondary | ICD-10-CM

## 2019-10-19 LAB — CUP PACEART REMOTE DEVICE CHECK
Battery Remaining Longevity: 3 mo
Battery Remaining Percentage: 3 %
Battery Voltage: 2.65 V
Date Time Interrogation Session: 20210621025754
Implantable Lead Implant Date: 20120228
Implantable Lead Implant Date: 20120228
Implantable Lead Implant Date: 20120228
Implantable Lead Location: 753858
Implantable Lead Location: 753859
Implantable Lead Location: 753860
Implantable Lead Model: 4196
Implantable Pulse Generator Implant Date: 20120228
Lead Channel Impedance Value: 460 Ohm
Lead Channel Impedance Value: 800 Ohm
Lead Channel Pacing Threshold Amplitude: 0.75 V
Lead Channel Pacing Threshold Amplitude: 1.25 V
Lead Channel Pacing Threshold Pulse Width: 0.5 ms
Lead Channel Pacing Threshold Pulse Width: 0.5 ms
Lead Channel Sensing Intrinsic Amplitude: 12 mV
Lead Channel Setting Pacing Amplitude: 2 V
Lead Channel Setting Pacing Amplitude: 2.25 V
Lead Channel Setting Pacing Pulse Width: 0.5 ms
Lead Channel Setting Pacing Pulse Width: 0.5 ms
Lead Channel Setting Sensing Sensitivity: 2 mV
Pulse Gen Model: 3210
Pulse Gen Serial Number: 2485643

## 2019-10-20 NOTE — Progress Notes (Signed)
Remote pacemaker transmission.   

## 2019-11-19 ENCOUNTER — Ambulatory Visit (INDEPENDENT_AMBULATORY_CARE_PROVIDER_SITE_OTHER): Payer: PRIVATE HEALTH INSURANCE | Admitting: *Deleted

## 2019-11-19 DIAGNOSIS — I5022 Chronic systolic (congestive) heart failure: Secondary | ICD-10-CM

## 2019-11-19 LAB — CUP PACEART REMOTE DEVICE CHECK
Battery Remaining Longevity: 1 mo
Battery Remaining Percentage: 0.5 %
Battery Voltage: 2.62 V
Date Time Interrogation Session: 20210722030336
Implantable Lead Implant Date: 20120228
Implantable Lead Implant Date: 20120228
Implantable Lead Implant Date: 20120228
Implantable Lead Location: 753858
Implantable Lead Location: 753859
Implantable Lead Location: 753860
Implantable Lead Model: 4196
Implantable Pulse Generator Implant Date: 20120228
Lead Channel Impedance Value: 460 Ohm
Lead Channel Impedance Value: 910 Ohm
Lead Channel Pacing Threshold Amplitude: 0.75 V
Lead Channel Pacing Threshold Amplitude: 1.375 V
Lead Channel Pacing Threshold Pulse Width: 0.5 ms
Lead Channel Pacing Threshold Pulse Width: 0.5 ms
Lead Channel Sensing Intrinsic Amplitude: 11.1 mV
Lead Channel Setting Pacing Amplitude: 2 V
Lead Channel Setting Pacing Amplitude: 2.375
Lead Channel Setting Pacing Pulse Width: 0.5 ms
Lead Channel Setting Pacing Pulse Width: 0.5 ms
Lead Channel Setting Sensing Sensitivity: 2 mV
Pulse Gen Model: 3210
Pulse Gen Serial Number: 2485643

## 2019-11-23 NOTE — Progress Notes (Signed)
Remote pacemaker transmission.   

## 2019-12-21 ENCOUNTER — Ambulatory Visit (INDEPENDENT_AMBULATORY_CARE_PROVIDER_SITE_OTHER): Payer: PRIVATE HEALTH INSURANCE | Admitting: *Deleted

## 2019-12-21 DIAGNOSIS — I4891 Unspecified atrial fibrillation: Secondary | ICD-10-CM

## 2019-12-21 LAB — CUP PACEART REMOTE DEVICE CHECK
Battery Remaining Longevity: 1 mo
Battery Remaining Percentage: 0.5 %
Battery Voltage: 2.62 V
Date Time Interrogation Session: 20210822031404
Implantable Lead Implant Date: 20120228
Implantable Lead Implant Date: 20120228
Implantable Lead Implant Date: 20120228
Implantable Lead Location: 753858
Implantable Lead Location: 753859
Implantable Lead Location: 753860
Implantable Lead Model: 4196
Implantable Pulse Generator Implant Date: 20120228
Lead Channel Impedance Value: 550 Ohm
Lead Channel Impedance Value: 940 Ohm
Lead Channel Pacing Threshold Amplitude: 0.875 V
Lead Channel Pacing Threshold Amplitude: 1.25 V
Lead Channel Pacing Threshold Pulse Width: 0.5 ms
Lead Channel Pacing Threshold Pulse Width: 0.5 ms
Lead Channel Sensing Intrinsic Amplitude: 12 mV
Lead Channel Setting Pacing Amplitude: 2 V
Lead Channel Setting Pacing Amplitude: 2.25 V
Lead Channel Setting Pacing Pulse Width: 0.5 ms
Lead Channel Setting Pacing Pulse Width: 0.5 ms
Lead Channel Setting Sensing Sensitivity: 2 mV
Pulse Gen Model: 3210
Pulse Gen Serial Number: 2485643

## 2019-12-25 NOTE — Progress Notes (Signed)
Remote pacemaker transmission.   

## 2020-01-21 ENCOUNTER — Ambulatory Visit (INDEPENDENT_AMBULATORY_CARE_PROVIDER_SITE_OTHER): Payer: PRIVATE HEALTH INSURANCE | Admitting: Emergency Medicine

## 2020-01-21 DIAGNOSIS — I5022 Chronic systolic (congestive) heart failure: Secondary | ICD-10-CM | POA: Diagnosis not present

## 2020-01-22 LAB — CUP PACEART REMOTE DEVICE CHECK
Battery Remaining Longevity: 1 mo
Battery Remaining Percentage: 0.5 %
Battery Voltage: 2.59 V
Date Time Interrogation Session: 20210923052339
Implantable Lead Implant Date: 20120228
Implantable Lead Implant Date: 20120228
Implantable Lead Implant Date: 20120228
Implantable Lead Location: 753858
Implantable Lead Location: 753859
Implantable Lead Location: 753860
Implantable Lead Model: 4196
Implantable Pulse Generator Implant Date: 20120228
Lead Channel Impedance Value: 490 Ohm
Lead Channel Impedance Value: 830 Ohm
Lead Channel Pacing Threshold Amplitude: 0.75 V
Lead Channel Pacing Threshold Amplitude: 1.375 V
Lead Channel Pacing Threshold Pulse Width: 0.5 ms
Lead Channel Pacing Threshold Pulse Width: 0.5 ms
Lead Channel Sensing Intrinsic Amplitude: 11.2 mV
Lead Channel Setting Pacing Amplitude: 2 V
Lead Channel Setting Pacing Amplitude: 2.375
Lead Channel Setting Pacing Pulse Width: 0.5 ms
Lead Channel Setting Pacing Pulse Width: 0.5 ms
Lead Channel Setting Sensing Sensitivity: 2 mV
Pulse Gen Model: 3210
Pulse Gen Serial Number: 2485643

## 2020-01-26 ENCOUNTER — Ambulatory Visit (INDEPENDENT_AMBULATORY_CARE_PROVIDER_SITE_OTHER): Payer: PRIVATE HEALTH INSURANCE | Admitting: Internal Medicine

## 2020-01-26 ENCOUNTER — Other Ambulatory Visit: Payer: Self-pay

## 2020-01-26 ENCOUNTER — Encounter: Payer: Self-pay | Admitting: Internal Medicine

## 2020-01-26 VITALS — BP 146/80 | HR 85 | Ht 70.0 in | Wt 218.0 lb

## 2020-01-26 DIAGNOSIS — I5022 Chronic systolic (congestive) heart failure: Secondary | ICD-10-CM

## 2020-01-26 DIAGNOSIS — I4891 Unspecified atrial fibrillation: Secondary | ICD-10-CM

## 2020-01-26 DIAGNOSIS — Z950 Presence of cardiac pacemaker: Secondary | ICD-10-CM

## 2020-01-26 NOTE — Patient Instructions (Signed)
Medication Instructions:  Your physician recommends that you continue on your current medications as directed. Please refer to the Current Medication list given to you today.  Labwork: You will get lab work today:  BMP and CBC  Testing/Procedures: None ordered.  Follow-Up:  SEE INSTRUCTION LETTER  Any Other Special Instructions Will Be Listed Below (If Applicable).  If you need a refill on your cardiac medications before your next appointment, please call your pharmacy.    Pacemaker Battery Change  A pacemaker battery usually lasts 5-15 years (6-7 years on average).  When the battery is low, your pacemaker battery and generator will be completely replaced.  Risks of this procedure include bleeding, bruising, infection, damage to other structures, pulling apart of the skin at the incision site, and allergic reactions to medicines or anesthetics.  Most often, this procedure is simpler than the first surgery because the wires (leads) that connect the generator to the heart are already in place.

## 2020-01-26 NOTE — H&P (View-Only) (Signed)
HPI [name] returns today for followup. He is a pleasant 59 yo man with chronic atrial fib, chronic systolic heart failure and non-compliance who underwent biv PPM and AV node ablation almost 10 years ago. He feels well. He remains non-compliant. He has reached ERI on his biv PPM. He notes that he feels cold at times but is otherwise asymptomatic. No chest pain or sob. He is still working full time in [employer]. No Known Allergies   Current Outpatient Medications  Medication Sig Dispense Refill  . albuterol (PROVENTIL) (2.5 MG/3ML) 0.083% nebulizer solution Take 3 mLs (2.5 mg total) by nebulization every 6 (six) hours as needed for wheezing or shortness of breath. 75 mL 12  . apixaban (ELIQUIS) 5 MG TABS tablet Take 5 mg by mouth daily.    . Coenzyme Q10 (CO Q 10) 100 MG CAPS Take 1 capsule by mouth daily.    . Omega-3 Fatty Acids (FISH OIL CONCENTRATE) 300 MG CAPS Take by mouth daily.    . furosemide (LASIX) 40 MG tablet Take 1 tablet (40 mg total) by mouth as needed. 90 tablet 3   No current facility-administered medications for this visit.     Past Medical History:  Diagnosis Date  . CHF (congestive heart failure) ([place])   . History of alcohol abuse   . Hyperlipidemia   . Nonischemic cardiomyopathy ([place])    Presumed to be tachycardia mediated  . Pacemaker 2012  . Persistent atrial fibrillation ([place]) 2012  . Snores     ROS:   All systems reviewed and negative except as noted in the HPI.   Past Surgical History:  Procedure Laterality Date  . APPENDECTOMY     S/P  . CARDIAC CATHETERIZATION  11/2009   Normal coronary arteries   . PACEMAKER INSERTION  2012   st jude     Family History  Problem Relation Age of Onset  . Heart attack Father 54       MI  . Colon cancer Neg Hx      Social History   Socioeconomic History  . Marital status: Divorced    Spouse name: Not on file  . Number of children: 2  . Years of education: Not on file  . Highest education  level: Not on file  Occupational History  . 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 Smoking status: Current Every Day Smoker    Packs/day: 0.25    Types: Cigarettes  . Smokeless tobacco: Never Used  Vaping Use  . Vaping Use: Never used  Substance and Sexual Activity  . Alcohol use: Yes    Alcohol/week: 21.0 standard drinks    Types: 21 Standard drinks or equivalent per week    Comment: At least 3 beers a day  . Drug use: No  . Sexual activity: Not on file  Other Topics Concern  . Not on file  Social History Narrative   Lives in [place]   Separated   2 children, no grandchildren   Social Determinants of Health   Financial Resource Strain:   . Difficulty of Paying Living Expenses: Not on file  Food Insecurity:   . Worried About Charity fundraiser in the Last Year: Not on file  . Ran Out of Food in the Last Year: Not on file  Transportation Needs:   . Lack of Transportation (Medical): Not on file  . Lack of Transportation (Non-Medical): Not on file  Physical Activity:   . Days of Exercise per Week: Not on file  . Minutes of Exercise per Session: Not on file  Stress:   . Feeling of Stress : Not on file  Social Connections:   . Frequency of Communication with Friends and Family: Not on file  . Frequency of Social Gatherings with Friends and Family: Not on file  . Attends Religious Services: Not on file  . Active Member of Clubs or Organizations: Not on file  . Attends Archivist Meetings: Not on file  . Marital Status: Not on file  Intimate Partner Violence:   . Fear of Current or Ex-Partner: Not on file  . Emotionally Abused: Not on file  . Physically Abused: Not on file  . Sexually Abused: Not on file     BP (!) 146/80   Pulse 85   Ht 5\' 10"  (1.778 m)   Wt 218 lb (98.9 kg)   SpO2 95%   BMI 31.28 kg/m   Physical Exam:  Well appearing NAD HEENT: Unremarkable Neck:  No JVD, no thyromegally Lymphatics:  No  adenopathy Back:  No CVA tenderness Lungs:  Clear with no wheezes HEART:  Regular rate rhythm, no murmurs, no rubs, no clicks Abd:  soft, positive bowel sounds, no organomegally, no rebound, no guarding Ext:  2 plus pulses, no edema, no cyanosis, no clubbing Skin:  No rashes no nodules Neuro:  CN II through XII intact, motor grossly intact  EKG - atrial fib and biv pacing  DEVICE  Normal device function.  See PaceArt for details. ERI.  Assess/Plan: 1. Chronic atrial fib- his rates are well controlled. No change. 2. Chronic systolic heart failure -his symptoms are class 2A. He will continue his current meds. 3. PPM - his St. Jude biv PPM is working normally. He will continue his current meds. 4. Systemic anti-coagulation - he has had no bleeding but he admits to non-compliance. I have encouraged him to be more compliant.  Carleene Overlie Doylene Splinter,MD

## 2020-01-26 NOTE — Progress Notes (Signed)
HPI Mike Bean returns today for followup. He is a pleasant 59 yo man with chronic atrial fib, chronic systolic heart failure and non-compliance who underwent biv PPM and AV node ablation almost 10 years ago. He feels well. He remains non-compliant. He has reached ERI on his biv PPM. He notes that he feels cold at times but is otherwise asymptomatic. No chest pain or sob. He is still working full time in Press [employer]. No Known Allergies   Current Outpatient Medications  Medication Sig Dispense Refill  . albuterol (PROVENTIL) (2.5 MG/3ML) 0.083% nebulizer solution Take 3 mLs (2.5 mg total) by nebulization every 6 (six) hours as needed for wheezing or shortness of breath. 75 mL 12  . apixaban (ELIQUIS) 5 MG TABS tablet Take 5 mg by mouth daily.    . Coenzyme Q10 (CO Q 10) 100 MG CAPS Take 1 capsule by mouth daily.    . Omega-3 Fatty Acids (FISH OIL CONCENTRATE) 300 MG CAPS Take by mouth daily.    . furosemide (LASIX) 40 MG tablet Take 1 tablet (40 mg total) by mouth as needed. 90 tablet 3   No current facility-administered medications for this visit.     Past Medical History:  Diagnosis Date  . CHF (congestive heart failure) ([place])   . History of alcohol abuse   . Hyperlipidemia   . Nonischemic cardiomyopathy ([place])    Presumed to be tachycardia mediated  . Pacemaker 2012  . Persistent atrial fibrillation ([place]) 2012  . Snores     ROS:   All systems reviewed and negative except as noted in the HPI.   Past Surgical History:  Procedure Laterality Date  . APPENDECTOMY     S/P  . CARDIAC CATHETERIZATION  11/2009   Normal coronary arteries   . PACEMAKER INSERTION  2012   st jude     Family History  Problem Relation Age of Onset  . Heart attack Father 45       MI  . Colon cancer Neg Hx      Social History   Socioeconomic History  . Marital status: Divorced    Spouse name: Not on file  . Number of children: 2  . Years of education: Not on file  . Highest education  level: Not on file  Occupational History  . 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 Smoking status: Current Every Day Smoker    Packs/day: 0.25    Types: Cigarettes  . Smokeless tobacco: Never Used  Vaping Use  . Vaping Use: Never used  Substance and Sexual Activity  . Alcohol use: Yes    Alcohol/week: 21.0 standard drinks    Types: 21 Standard drinks or equivalent per week    Comment: At least 3 beers a day  . Drug use: No  . Sexual activity: Not on file  Other Topics Concern  . Not on file  Social History Narrative   Lives in [place]   Separated   2 children, no grandchildren   Social Determinants of Health   Financial Resource Strain:   . Difficulty of Paying Living Expenses: Not on file  Food Insecurity:   . Worried About Charity fundraiser in the Last Year: Not on file  . Ran Out of Food in the Last Year: Not on file  Transportation Needs:   . Lack of Transportation (Medical): Not on file  . Lack of Transportation (Non-Medical): Not on file  Physical Activity:   . Days of Exercise per Week: Not on file  . Minutes of Exercise per Session: Not on file  Stress:   . Feeling of Stress : Not on file  Social Connections:   . Frequency of Communication with Friends and Family: Not on file  . Frequency of Social Gatherings with Friends and Family: Not on file  . Attends Religious Services: Not on file  . Active Member of Clubs or Organizations: Not on file  . Attends Archivist Meetings: Not on file  . Marital Status: Not on file  Intimate Partner Violence:   . Fear of Current or Ex-Partner: Not on file  . Emotionally Abused: Not on file  . Physically Abused: Not on file  . Sexually Abused: Not on file     BP (!) 146/80   Pulse 85   Ht 5\' 10"  (1.778 m)   Wt 218 lb (98.9 kg)   SpO2 95%   BMI 31.28 kg/m   Physical Exam:  Well appearing NAD HEENT: Unremarkable Neck:  No JVD, no thyromegally Lymphatics:  No  adenopathy Back:  No CVA tenderness Lungs:  Clear with no wheezes HEART:  Regular rate rhythm, no murmurs, no rubs, no clicks Abd:  soft, positive bowel sounds, no organomegally, no rebound, no guarding Ext:  2 plus pulses, no edema, no cyanosis, no clubbing Skin:  No rashes no nodules Neuro:  CN II through XII intact, motor grossly intact  EKG - atrial fib and biv pacing  DEVICE  Normal device function.  See PaceArt for details. ERI.  Assess/Plan: 1. Chronic atrial fib- his rates are well controlled. No change. 2. Chronic systolic heart failure -his symptoms are class 2A. He will continue his current meds. 3. PPM - his St. Jude biv PPM is working normally. He will continue his current meds. 4. Systemic anti-coagulation - he has had no bleeding but he admits to non-compliance. I have encouraged him to be more compliant.  Mike Bean Mike Pergola,MD

## 2020-01-26 NOTE — Progress Notes (Signed)
Remote pacemaker transmission.   

## 2020-01-27 LAB — BASIC METABOLIC PANEL
BUN/Creatinine Ratio: 10 (ref 10–24)
BUN: 10 mg/dL (ref 8–27)
CO2: 26 mmol/L (ref 20–29)
Calcium: 8.9 mg/dL (ref 8.6–10.2)
Chloride: 99 mmol/L (ref 96–106)
Creatinine, Ser: 1.05 mg/dL (ref 0.76–1.27)
GFR calc Af Amer: 88 mL/min/{1.73_m2} (ref >59)
GFR calc non Af Amer: 76 mL/min/{1.73_m2} (ref >59)
Glucose: 71 mg/dL (ref 65–99)
Potassium: 4.4 mmol/L (ref 3.5–5.2)
Sodium: 139 mmol/L (ref 134–144)

## 2020-01-27 LAB — CBC WITH DIFFERENTIAL/PLATELET
Basophils Absolute: 0.1 10*3/uL (ref 0.0–0.2)
Basos: 1 %
EOS (ABSOLUTE): 0.3 10*3/uL (ref 0.0–0.4)
Eos: 3 %
Hematocrit: 50.7 % (ref 37.5–51.0)
Hemoglobin: 17.1 g/dL (ref 13.0–17.7)
Immature Grans (Abs): 0.1 10*3/uL (ref 0.0–0.1)
Immature Granulocytes: 1 %
Lymphocytes Absolute: 2.2 10*3/uL (ref 0.7–3.1)
Lymphs: 23 %
MCH: 32.1 pg (ref 26.6–33.0)
MCHC: 33.7 g/dL (ref 31.5–35.7)
MCV: 95 fL (ref 79–97)
Monocytes Absolute: 1 10*3/uL — ABNORMAL HIGH (ref 0.1–0.9)
Monocytes: 11 %
Neutrophils Absolute: 5.8 10*3/uL (ref 1.4–7.0)
Neutrophils: 61 %
Platelets: 191 10*3/uL (ref 150–450)
RBC: 5.33 x10E6/uL (ref 4.14–5.80)
RDW: 11.9 % (ref 11.6–15.4)
WBC: 9.5 10*3/uL (ref 3.4–10.8)

## 2020-02-05 NOTE — Progress Notes (Signed)
Attempted to call patient regarding procedure instructions for Monday.  Left voice mail, nothing to eat or drink after midnight.  No medications morning of procedure, need responsible adult to drive you home and stay with you.

## 2020-02-06 ENCOUNTER — Other Ambulatory Visit (HOSPITAL_COMMUNITY)
Admission: RE | Admit: 2020-02-06 | Discharge: 2020-02-06 | Disposition: A | Discharge status: Home / Self Care | Payer: PRIVATE HEALTH INSURANCE | Source: Ambulatory Visit | Attending: Internal Medicine | Admitting: Internal Medicine

## 2020-02-06 DIAGNOSIS — Z20822 Contact with and (suspected) exposure to covid-19: Secondary | ICD-10-CM | POA: Diagnosis not present

## 2020-02-06 DIAGNOSIS — Z01812 Encounter for preprocedural laboratory examination: Secondary | ICD-10-CM | POA: Insufficient documentation

## 2020-02-06 LAB — SARS CORONAVIRUS 2 (TAT 6-24 HRS): SARS Coronavirus 2: NEGATIVE

## 2020-02-08 ENCOUNTER — Ambulatory Visit (HOSPITAL_COMMUNITY)
Admission: RE | Disposition: A | Discharge status: Home / Self Care | Payer: Self-pay | Source: Ambulatory Visit | Attending: Internal Medicine

## 2020-02-08 ENCOUNTER — Other Ambulatory Visit: Payer: Self-pay

## 2020-02-08 ENCOUNTER — Ambulatory Visit (HOSPITAL_COMMUNITY)
Admission: RE | Admit: 2020-02-08 | Discharge: 2020-02-08 | Disposition: A | Discharge status: Home / Self Care | Payer: PRIVATE HEALTH INSURANCE | Source: Ambulatory Visit | Attending: Internal Medicine | Admitting: Internal Medicine

## 2020-02-08 DIAGNOSIS — E785 Hyperlipidemia, unspecified: Secondary | ICD-10-CM | POA: Insufficient documentation

## 2020-02-08 DIAGNOSIS — Z8249 Family history of ischemic heart disease and other diseases of the circulatory system: Secondary | ICD-10-CM | POA: Diagnosis not present

## 2020-02-08 DIAGNOSIS — I5022 Chronic systolic (congestive) heart failure: Secondary | ICD-10-CM | POA: Diagnosis not present

## 2020-02-08 DIAGNOSIS — Z45010 Encounter for checking and testing of cardiac pacemaker pulse generator [battery]: Secondary | ICD-10-CM | POA: Insufficient documentation

## 2020-02-08 DIAGNOSIS — Z9119 Patient's noncompliance with other medical treatment and regimen: Secondary | ICD-10-CM | POA: Diagnosis not present

## 2020-02-08 DIAGNOSIS — Z79899 Other long term (current) drug therapy: Secondary | ICD-10-CM | POA: Diagnosis not present

## 2020-02-08 DIAGNOSIS — I482 Chronic atrial fibrillation, unspecified: Secondary | ICD-10-CM | POA: Insufficient documentation

## 2020-02-08 DIAGNOSIS — Z7901 Long term (current) use of anticoagulants: Secondary | ICD-10-CM | POA: Diagnosis not present

## 2020-02-08 DIAGNOSIS — I428 Other cardiomyopathies: Secondary | ICD-10-CM | POA: Diagnosis not present

## 2020-02-08 DIAGNOSIS — F17210 Nicotine dependence, cigarettes, uncomplicated: Secondary | ICD-10-CM | POA: Diagnosis not present

## 2020-02-08 HISTORY — PX: BIV PACEMAKER GENERATOR CHANGEOUT: EP1198

## 2020-02-08 SURGERY — BIV PACEMAKER GENERATOR CHANGEOUT

## 2020-02-08 MED ORDER — ACETAMINOPHEN 325 MG PO TABS
ORAL_TABLET | ORAL | Status: AC
  Filled 2020-02-08: qty 2

## 2020-02-08 MED ORDER — SODIUM CHLORIDE 0.9 % IV SOLN
80.0000 mg | INTRAVENOUS | Status: AC
  Administered 2020-02-08: 80 mg

## 2020-02-08 MED ORDER — ACETAMINOPHEN 325 MG PO TABS
325.0000 mg | ORAL_TABLET | ORAL | Status: DC | PRN
  Administered 2020-02-08: 650 mg via ORAL
  Filled 2020-02-08: qty 2

## 2020-02-08 MED ORDER — SODIUM CHLORIDE 0.9 % IV SOLN
INTRAVENOUS | Status: AC
  Filled 2020-02-08: qty 2

## 2020-02-08 MED ORDER — CEFAZOLIN SODIUM-DEXTROSE 2-4 GM/100ML-% IV SOLN
2.0000 g | INTRAVENOUS | Status: AC
  Administered 2020-02-08: 2 g via INTRAVENOUS

## 2020-02-08 MED ORDER — FENTANYL CITRATE (PF) 100 MCG/2ML IJ SOLN
INTRAMUSCULAR | Status: AC
  Filled 2020-02-08: qty 2

## 2020-02-08 MED ORDER — CEFAZOLIN SODIUM-DEXTROSE 2-4 GM/100ML-% IV SOLN
INTRAVENOUS | Status: AC
  Filled 2020-02-08: qty 100

## 2020-02-08 MED ORDER — LIDOCAINE HCL 1 % IJ SOLN
INTRAMUSCULAR | Status: AC
  Filled 2020-02-08: qty 20

## 2020-02-08 MED ORDER — SODIUM CHLORIDE 0.9 % IV SOLN: INTRAVENOUS | Status: DC

## 2020-02-08 MED ORDER — MIDAZOLAM HCL 5 MG/5ML IJ SOLN
INTRAMUSCULAR | Status: AC
  Filled 2020-02-08: qty 5

## 2020-02-08 MED ORDER — LIDOCAINE HCL (PF) 1 % IJ SOLN
INTRAMUSCULAR | Status: DC | PRN
  Administered 2020-02-08: 60 mL

## 2020-02-08 MED ORDER — ONDANSETRON HCL 4 MG/2ML IJ SOLN: 4.0000 mg | Freq: Four times a day (QID) | INTRAMUSCULAR | Status: DC | PRN

## 2020-02-08 MED ORDER — CHLORHEXIDINE GLUCONATE 4 % EX LIQD: 4.0000 "application " | Freq: Once | CUTANEOUS | Status: DC

## 2020-02-08 SURGICAL SUPPLY — 5 items
CABLE SURGICAL S-101-97-12 (CABLE) ×2 IMPLANT
PACEMAKER ALLR CRT-P RF PM3222 (Pacemaker) ×1 IMPLANT
PAD PRO RADIOLUCENT 2001M-C (PAD) ×2 IMPLANT
PPM ALLURE CRT-P RF PM3222 (Pacemaker) ×2 IMPLANT
TRAY PACEMAKER INSERTION (PACKS) ×2 IMPLANT

## 2020-02-08 NOTE — Interval H&P Note (Signed)
History and Physical Interval Note:  02/08/2020 4:04 PM  Mike Bean  has presented today for surgery, with the diagnosis of eri.  The various methods of treatment have been discussed with the patient and family. After consideration of risks, benefits and other options for treatment, the patient has consented to  Procedure(s): [place] (N/A) as a surgical intervention.  The patient's history has been reviewed, patient examined, no change in status, stable for surgery.  I have reviewed the patient's chart and labs.  Questions were answered to the patient's satisfaction.     [name]

## 2020-02-08 NOTE — Interval H&P Note (Signed)
History and Physical Interval Note:  02/08/2020 4:04 PM  Mike Bean  has presented today for surgery, with the diagnosis of eri.  The various methods of treatment have been discussed with the patient and family. After consideration of risks, benefits and other options for treatment, the patient has consented to  Procedure(s): [place] (N/A) as a surgical intervention.  The patient's history has been reviewed, patient examined, no change in status, stable for surgery.  I have reviewed the patient's chart and labs.  Questions were answered to the patient's satisfaction.     [name]

## 2020-02-08 NOTE — Discharge Instructions (Signed)
Pacemaker Battery Change, Care After This sheet gives you information about how to care for yourself after your procedure. Your health care provider may also give you more specific instructions. If you have problems or questions, contact your health care provider. What can I expect after the procedure? After your procedure, it is common to have:  Pain or soreness at the site where the pacemaker was inserted.  Swelling at the site where the pacemaker was inserted. Follow these instructions at home: Incision care   Keep the incision clean and dry. ? Do not take baths, swim, or use a hot tub until your health care provider approves. ? You may shower the day after your procedure, or as directed by your health care provider. ? Pat the area dry with a clean towel. Do not rub the area. This may cause bleeding.  Follow instructions from your health care provider about how to take care of your incision. Make sure you: ? Wash your hands with soap and water before you change your bandage (dressing). If soap and water are not available, use hand sanitizer. ? Change your dressing as told by your health care provider. ? Leave stitches (sutures), skin glue, or adhesive strips in place. These skin closures may need to stay in place for 2 weeks or longer. If adhesive strip edges start to loosen and curl up, you may trim the loose edges. Do not remove adhesive strips completely unless your health care provider tells you to do that.  Check your incision area every day for signs of infection. Check for: ? More redness, swelling, or pain. ? More fluid or blood. ? Warmth. ? Pus or a bad smell. Activity  Do not lift anything that is heavier than 10 lb (4.5 kg) until your health care provider says it is okay to do so.  For the first 2 weeks, or as long as told by your health care provider: ? Avoid lifting your left arm higher than your shoulder. ? Be gentle when you move your arms over your head. It is okay  to raise your arm to comb your hair. ? Avoid strenuous exercise.  Ask your health care provider when it is okay to: ? Resume your normal activities. ? Return to work or school. ? Resume sexual activity. Eating and drinking  Eat a heart-healthy diet. This should include plenty of fresh fruits and vegetables, whole grains, low-fat dairy products, and lean protein like chicken and fish.  Limit alcohol intake to no more than 1 drink a day for non-pregnant women and 2 drinks a day for men. One drink equals 12 oz of beer, 5 oz of wine, or 1 oz of hard liquor.  Check ingredients and nutrition facts on packaged foods and beverages. Avoid the following types of food: ? Food that is high in salt (sodium). ? Food that is high in saturated fat, like full-fat dairy or red meat. ? Food that is high in trans fat, like fried food. ? Food and drinks that are high in sugar. Lifestyle  Do not use any products that contain nicotine or tobacco, such as cigarettes and e-cigarettes. If you need help quitting, ask your health care provider.  Take steps to manage and control your weight.  Get regular exercise. Aim for 150 minutes of moderate-intensity exercise (such as walking or yoga) or 75 minutes of vigorous exercise (such as running or swimming) each week.  Manage other health problems, such as diabetes or high blood pressure. Ask your health  care provider how you can manage these conditions. General instructions  Do not drive for 24 hours after your procedure if you were given a medicine to help you relax (sedative).  Take over-the-counter and prescription medicines only as told by your health care provider.  Avoid putting pressure on the area where the pacemaker was placed.  If you need an MRI after your pacemaker has been placed, be sure to tell the health care provider who orders the MRI that you have a pacemaker.  Avoid close and prolonged exposure to electrical devices that have strong  magnetic fields. These include: ? Cell phones. Avoid keeping them in a pocket near the pacemaker, and try using the ear opposite the pacemaker. ? MP3 players. ? Household appliances, like microwaves. ? Metal detectors. ? Electric generators. ? High-tension wires.  Keep all follow-up visits as directed by your health care provider. This is important. Contact a health care provider if:  You have pain at the incision site that is not relieved by over-the-counter or prescription medicines.  You have any of these around your incision site or coming from it: ? More redness, swelling, or pain. ? Fluid or blood. ? Warmth to the touch. ? Pus or a bad smell.  You have a fever.  You feel brief, occasional palpitations, light-headedness, or any symptoms that you think might be related to your heart. Get help right away if:  You experience chest pain that is different from the pain at the pacemaker site.  You develop a red streak that extends above or below the incision site.  You experience shortness of breath.  You have palpitations or an irregular heartbeat.  You have light-headedness that does not go away quickly.  You faint or have dizzy spells.  Your pulse suddenly drops or increases rapidly and does not return to normal.  You begin to gain weight and your legs and ankles swell. Summary  After your procedure, it is common to have pain, soreness, and some swelling where the pacemaker was inserted.  Make sure to keep your incision clean and dry. Follow instructions from your health care provider about how to take care of your incision.  Check your incision every day for signs of infection, such as more pain or swelling, pus or a bad smell, warmth, or leaking fluid and blood.  Avoid strenuous exercise and lifting your left arm higher than your shoulder for 2 weeks, or as long as told by your health care provider. This information is not intended to replace advice given to you by  your health care provider. Make sure you discuss any questions you have with your health care provider. Document Revised: 03/29/2017 Document Reviewed: 03/08/2016 Elsevier Patient Education  2020 [employer].

## 2020-02-08 NOTE — Progress Notes (Signed)
Discharge instructions reviewed with pt voices understanding. Pt states he was told to hold Eliquis for 2 days after the procedure

## 2020-02-09 ENCOUNTER — Encounter (HOSPITAL_COMMUNITY): Payer: Self-pay | Admitting: Internal Medicine

## 2020-02-09 ENCOUNTER — Telehealth: Payer: Self-pay

## 2020-02-09 MED FILL — Midazolam HCl Inj 5 MG/5ML (Base Equivalent): INTRAMUSCULAR | Qty: 5 | Status: AC

## 2020-02-09 MED FILL — Fentanyl Citrate Preservative Free (PF) Inj 100 MCG/2ML: INTRAMUSCULAR | Qty: 2 | Status: AC

## 2020-02-09 MED FILL — Lidocaine HCl Local Inj 1%: INTRAMUSCULAR | Qty: 60 | Status: AC

## 2020-02-09 NOTE — Telephone Encounter (Signed)
Spoke with pt, reviewed DC instruction post gen changeout including activity restriction, tylenol for pain.

## 2020-02-09 NOTE — Telephone Encounter (Signed)
Patient called and had some questions about discharge information as he did not get any paperwork from the hospital

## 2020-02-10 ENCOUNTER — Encounter (HOSPITAL_COMMUNITY): Payer: Self-pay | Admitting: Internal Medicine

## 2020-02-18 ENCOUNTER — Ambulatory Visit (INDEPENDENT_AMBULATORY_CARE_PROVIDER_SITE_OTHER): Payer: PRIVATE HEALTH INSURANCE | Admitting: Emergency Medicine

## 2020-02-18 ENCOUNTER — Other Ambulatory Visit: Payer: Self-pay

## 2020-02-18 DIAGNOSIS — I5022 Chronic systolic (congestive) heart failure: Secondary | ICD-10-CM

## 2020-02-18 LAB — CUP PACEART INCLINIC DEVICE CHECK
Battery Remaining Longevity: 90 mo
Battery Voltage: 3.05 V
Brady Statistic RA Percent Paced: 0 %
Brady Statistic RV Percent Paced: 99.52 %
Date Time Interrogation Session: 20211021114823
Implantable Lead Implant Date: 20120228
Implantable Lead Implant Date: 20120228
Implantable Lead Implant Date: 20120228
Implantable Lead Location: 753858
Implantable Lead Location: 753859
Implantable Lead Location: 753860
Implantable Lead Model: 4196
Implantable Pulse Generator Implant Date: 20211011
Lead Channel Impedance Value: 537.5 Ohm
Lead Channel Impedance Value: 837.5 Ohm
Lead Channel Pacing Threshold Amplitude: 0.75 V
Lead Channel Pacing Threshold Amplitude: 1.25 V
Lead Channel Pacing Threshold Amplitude: 1.25 V
Lead Channel Pacing Threshold Pulse Width: 0.5 ms
Lead Channel Pacing Threshold Pulse Width: 0.8 ms
Lead Channel Pacing Threshold Pulse Width: 0.8 ms
Lead Channel Sensing Intrinsic Amplitude: 12 mV
Lead Channel Setting Pacing Amplitude: 2 V
Lead Channel Setting Pacing Amplitude: 2.5 V
Lead Channel Setting Pacing Pulse Width: 0.5 ms
Lead Channel Setting Pacing Pulse Width: 0.8 ms
Lead Channel Setting Sensing Sensitivity: 4 mV
Pulse Gen Model: 3222
Pulse Gen Serial Number: 3859402

## 2020-02-18 MED ORDER — APIXABAN 5 MG PO TABS
5.0000 mg | ORAL_TABLET | Freq: Two times a day (BID) | ORAL | 1 refills | Status: DC
Start: 2020-02-18 — End: 2021-03-14

## 2020-02-18 NOTE — Progress Notes (Signed)
Wound check appointment. Steri-strips removed. Wound without redness or edema. Incision edges approximated, wound well healed. Normal device function.  Programmed VVIR due to chronic AF, pt is on Eliquis for [place].  Patient is BiV pacining 99% of the time.  Thresholds, RV/LV sensing, and impedances consistent with implant measurements. Device programmed appropriately within safety margain for chronic leads, Cap confirm unable to run for LV lead today, programmed off and outputs fixed 2.5v@ 0.44ms per Dr. Lovena Le.   Histogram distribution appropriate for patient and level of activity. No mode switches or high ventricular rates noted. Patient educated about wound care, arm mobility, lifting restrictions. Patient is enrolled in remote monitoring, next scheduled check 05/19/20.  ROV with Dr. Lovena Le on 05/17/20.

## 2020-02-24 ENCOUNTER — Telehealth: Payer: Self-pay | Admitting: Internal Medicine

## 2020-02-24 NOTE — Telephone Encounter (Signed)
   The patient feels like he is getting bronchitis and every time he coughs his left lung hurts. He is just nervous because he just had his pacemaker replaced. The patient had an appointment with Amy last week for his wound check and wanted to follow up with her . If she is not available he  would like one of Dr. Tanna Furry nurses to call him sometime today.

## 2020-02-24 NOTE — Telephone Encounter (Signed)
Reports he has an intermittent productive cough x 3 days with white sputum. He reports he may have had a low grade fever last night but did not take his temperature. He reports chest pain on inspiration and with a cough on left side of his chest. Recent gen change 02/08/20 with mature leads I place, remote transmission showed device function WNL, no events . He reports no change in device location, no edema, redness, pain, or drainage. Advised patient to call PCP or urgent care to be evaluated due to respiratory s/sx and fever. ED precautions given.

## 2020-04-13 ENCOUNTER — Telehealth: Payer: Self-pay

## 2020-04-13 NOTE — Telephone Encounter (Signed)
Remote transmission reviewed. Patient reassured that device functioning WNL and that pulse ox is not as accurate as device determining HR. Patient had HR of 68 on a reading earlier today and he was concerned because his lower rate is set at 70 bpm. Questions answered and reassured.

## 2020-04-13 NOTE — Telephone Encounter (Signed)
Patient wanted to know if we can tell him the results of his transmission. Transmission received 04/13/2020

## 2020-05-17 ENCOUNTER — Telehealth (INDEPENDENT_AMBULATORY_CARE_PROVIDER_SITE_OTHER): Payer: PRIVATE HEALTH INSURANCE | Admitting: Internal Medicine

## 2020-05-17 ENCOUNTER — Other Ambulatory Visit: Payer: Self-pay

## 2020-05-17 DIAGNOSIS — I4891 Unspecified atrial fibrillation: Secondary | ICD-10-CM | POA: Diagnosis not present

## 2020-05-17 DIAGNOSIS — I5022 Chronic systolic (congestive) heart failure: Secondary | ICD-10-CM

## 2020-05-17 NOTE — Progress Notes (Signed)
Electrophysiology TeleHealth Note   Due to national recommendations of social distancing due to COVID 19, an audio/video telehealth visit is felt to be most appropriate for this patient at this time.  See MyChart message from today for the patient's consent to telehealth for [facility].   Date:  05/17/2020   ID:  Mike Bean, DOB [birth year], MRN [id number]  Location: patient's home  Provider location: [address] [place]  Evaluation Performed: Follow-up visit  PCP:  Patient, No Pcp Per  Cardiologist:  No primary care provider on file.  Electrophysiologist:  Dr Lovena Le  Chief Complaint:  " I feel ok."  History of Present Illness:    Mike Bean is a 60 y.o. male who presents via audio/video conferencing for a telehealth visit today.  Since last being seen in our clinic, the patient reports doing very well.  He admits to dietary indiscretion with sodium and is still drinking and smoking too much. No chest pain or sob. He has had some trouble with his lasix as it makes it hard for him to work. He has to stop and use the bathroom. No syncope. He was sore after his PPM gen change out but feels well now.  Past Medical History:  Diagnosis Date  . CHF (congestive heart failure) ([place])   . History of alcohol abuse   . Hyperlipidemia   . Nonischemic cardiomyopathy ([place])    Presumed to be tachycardia mediated  . Pacemaker 2012  . Persistent atrial fibrillation ([place]) 2012  . Snores     Past Surgical History:  Procedure Laterality Date  . APPENDECTOMY     S/P  . BIV PACEMAKER GENERATOR CHANGEOUT N/A 02/08/2020   Procedure: BIV PACEMAKER GENERATOR CHANGEOUT;  Surgeon: Evans Lance, MD;  Location: [facility];  Service: Cardiovascular;  Laterality: N/A;  . CARDIAC CATHETERIZATION  11/2009   Normal coronary arteries   . PACEMAKER INSERTION  2012   st jude    Current Outpatient Medications  Medication Sig Dispense Refill  . albuterol (PROVENTIL) (2.5  MG/3ML) 0.083% nebulizer solution Take 3 mLs (2.5 mg total) by nebulization every 6 (six) hours as needed for wheezing or shortness of breath. 75 mL 12  . albuterol (VENTOLIN HFA) 108 (90 Base) MCG/ACT inhaler Inhale 2 puffs into the lungs every 6 (six) hours as needed for wheezing or shortness of breath.    Mar[place] Kitchen apixaban (ELIQUIS) 5 MG TABS tablet Take 1 tablet (5 mg total) by mouth 2 (two) times daily. 180 tablet 1  . Coenzyme Q10 (CO Q 10) 100 MG CAPS Take 100 ng by mouth daily.     . furosemide (LASIX) 40 MG tablet Take 1 tablet (40 mg total) by mouth as needed. (Patient taking differently: Take 40 mg by mouth daily as needed for fluid. ) 90 tablet 3  . Omega-3 Fatty Acids (FISH OIL CONCENTRATE) 300 MG CAPS Take 300 mg by mouth daily.      No current facility-administered medications for this visit.    Allergies:   Patient has no known allergies.   Social History:  The patient  reports that he has been smoking cigarettes. He has been smoking about 0.25 packs per day. He has never used smokeless tobacco. He reports current alcohol use of about 21.0 standard drinks of alcohol per week. He reports that he does not use drugs.   Family History:  The patient's  family history includes Heart attack (age of onset: 41)  in his father.   ROS:  Please see the history of present illness.   All other systems are personally reviewed and negative.    Exam:    Vital Signs:  There were no vitals taken for this visit.  Well appearing, alert and conversant, regular work of breathing,  good skin color Eyes- anicteric, neuro- grossly intact, skin- no apparent rash or lesions or cyanosis, mouth- oral mucosa is pink   Labs/Other Tests and Data Reviewed:    Recent Labs: 06/09/2019: ALT 16 01/26/2020: BUN 10; Creatinine, Ser 1.05; Hemoglobin 17.1; Platelets 191; Potassium 4.4; Sodium 139   Wt Readings from Last 3 Encounters:  02/08/20 215 lb (97.5 kg)  01/26/20 218 lb (98.9 kg)  06/17/19 208 lb (94.3 kg)      Other studies personally reviewed: Additional studies/ records that were reviewed today include:  Last device remote is reviewed from [facility] PDF dated 10/21 which reveals normal device function, no arrhythmias except for chronic atrial fib   ASSESSMENT & PLAN:    1.  Atrial fib - his rates are well controlled.  2. Chronic systolic heart failure - he has had normalization of his LV function. 3. PPM - his St. Jude biv ppm is working normally. 4. Tobacco abuse - he is encouraged to stop smoking.    COVID 19 screen The patient denies symptoms of COVID 19 at this time.  The importance of social distancing was discussed today.  Follow-up:  1 year Next remote: next month  Current medicines are reviewed at length with the patient today.   The patient does not have concerns regarding his medicines.  The following changes were made today:  none  Labs/ tests ordered today include: *one** No orders of the defined types were placed in this encounter.    Patient Risk:  after full review of this patients clinical status, I feel that they are at moderate risk at this time.  Today, I have spent 21 minutes with the patient with telehealth technology discussing all of the above .    Signed, [name], MD  05/17/2020 2:31 PM     [facility] [address] [place] [address] [phone] (office) [phone] (fax)

## 2020-05-19 ENCOUNTER — Ambulatory Visit (INDEPENDENT_AMBULATORY_CARE_PROVIDER_SITE_OTHER): Payer: PRIVATE HEALTH INSURANCE

## 2020-05-19 DIAGNOSIS — I429 Cardiomyopathy, unspecified: Secondary | ICD-10-CM | POA: Diagnosis not present

## 2020-05-19 LAB — CUP PACEART REMOTE DEVICE CHECK
Battery Remaining Longevity: 92 mo
Battery Remaining Percentage: 95.5 %
Battery Voltage: 3.01 V
Date Time Interrogation Session: 20220120043212
Implantable Lead Implant Date: 20120228
Implantable Lead Implant Date: 20120228
Implantable Lead Implant Date: 20120228
Implantable Lead Location: 753858
Implantable Lead Location: 753859
Implantable Lead Location: 753860
Implantable Lead Model: 4196
Implantable Pulse Generator Implant Date: 20211011
Lead Channel Impedance Value: 590 Ohm
Lead Channel Impedance Value: 760 Ohm
Lead Channel Pacing Threshold Amplitude: 0.75 V
Lead Channel Pacing Threshold Amplitude: 1.25 V
Lead Channel Pacing Threshold Pulse Width: 0.5 ms
Lead Channel Pacing Threshold Pulse Width: 0.8 ms
Lead Channel Sensing Intrinsic Amplitude: 9.6 mV
Lead Channel Setting Pacing Amplitude: 2 V
Lead Channel Setting Pacing Amplitude: 2.5 V
Lead Channel Setting Pacing Pulse Width: 0.5 ms
Lead Channel Setting Pacing Pulse Width: 0.8 ms
Lead Channel Setting Sensing Sensitivity: 4 mV
Pulse Gen Model: 3222
Pulse Gen Serial Number: 3859402

## 2020-06-01 NOTE — Progress Notes (Signed)
Remote pacemaker transmission.   

## 2020-06-06 IMAGING — DX DG CHEST 2V
2 series · 2 of 2 positions shown · non-contrast
Comparison: 10/25/2017

CLINICAL DATA: Shortness of breath, productive cough and central
chest pressure for 2-3 weeks, history of atrial fibrillation,
pacemaker, non ischemic cardiomyopathy, CHF, smoker

EXAM:
CHEST - 2 VIEW

[chest pa]
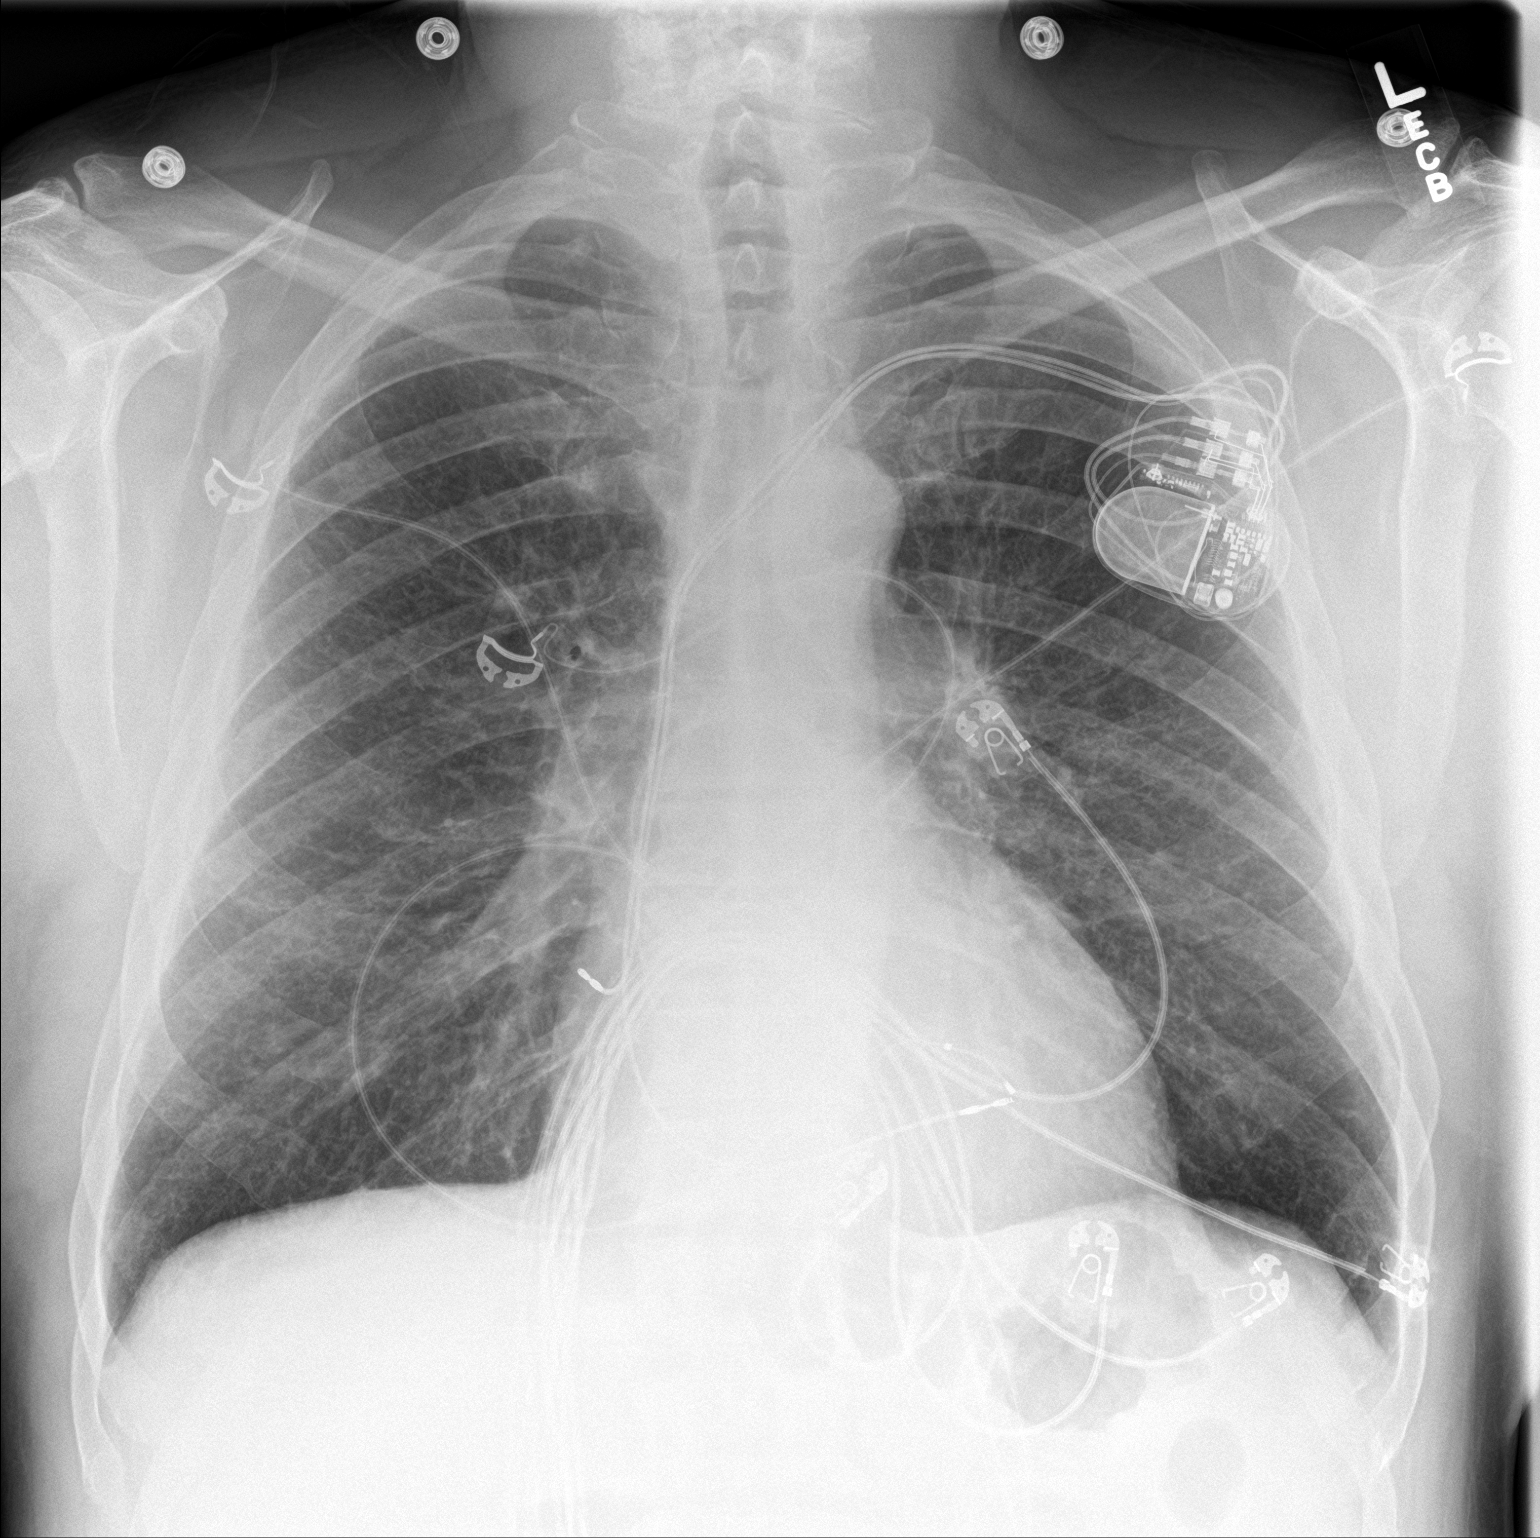

[chest lat]
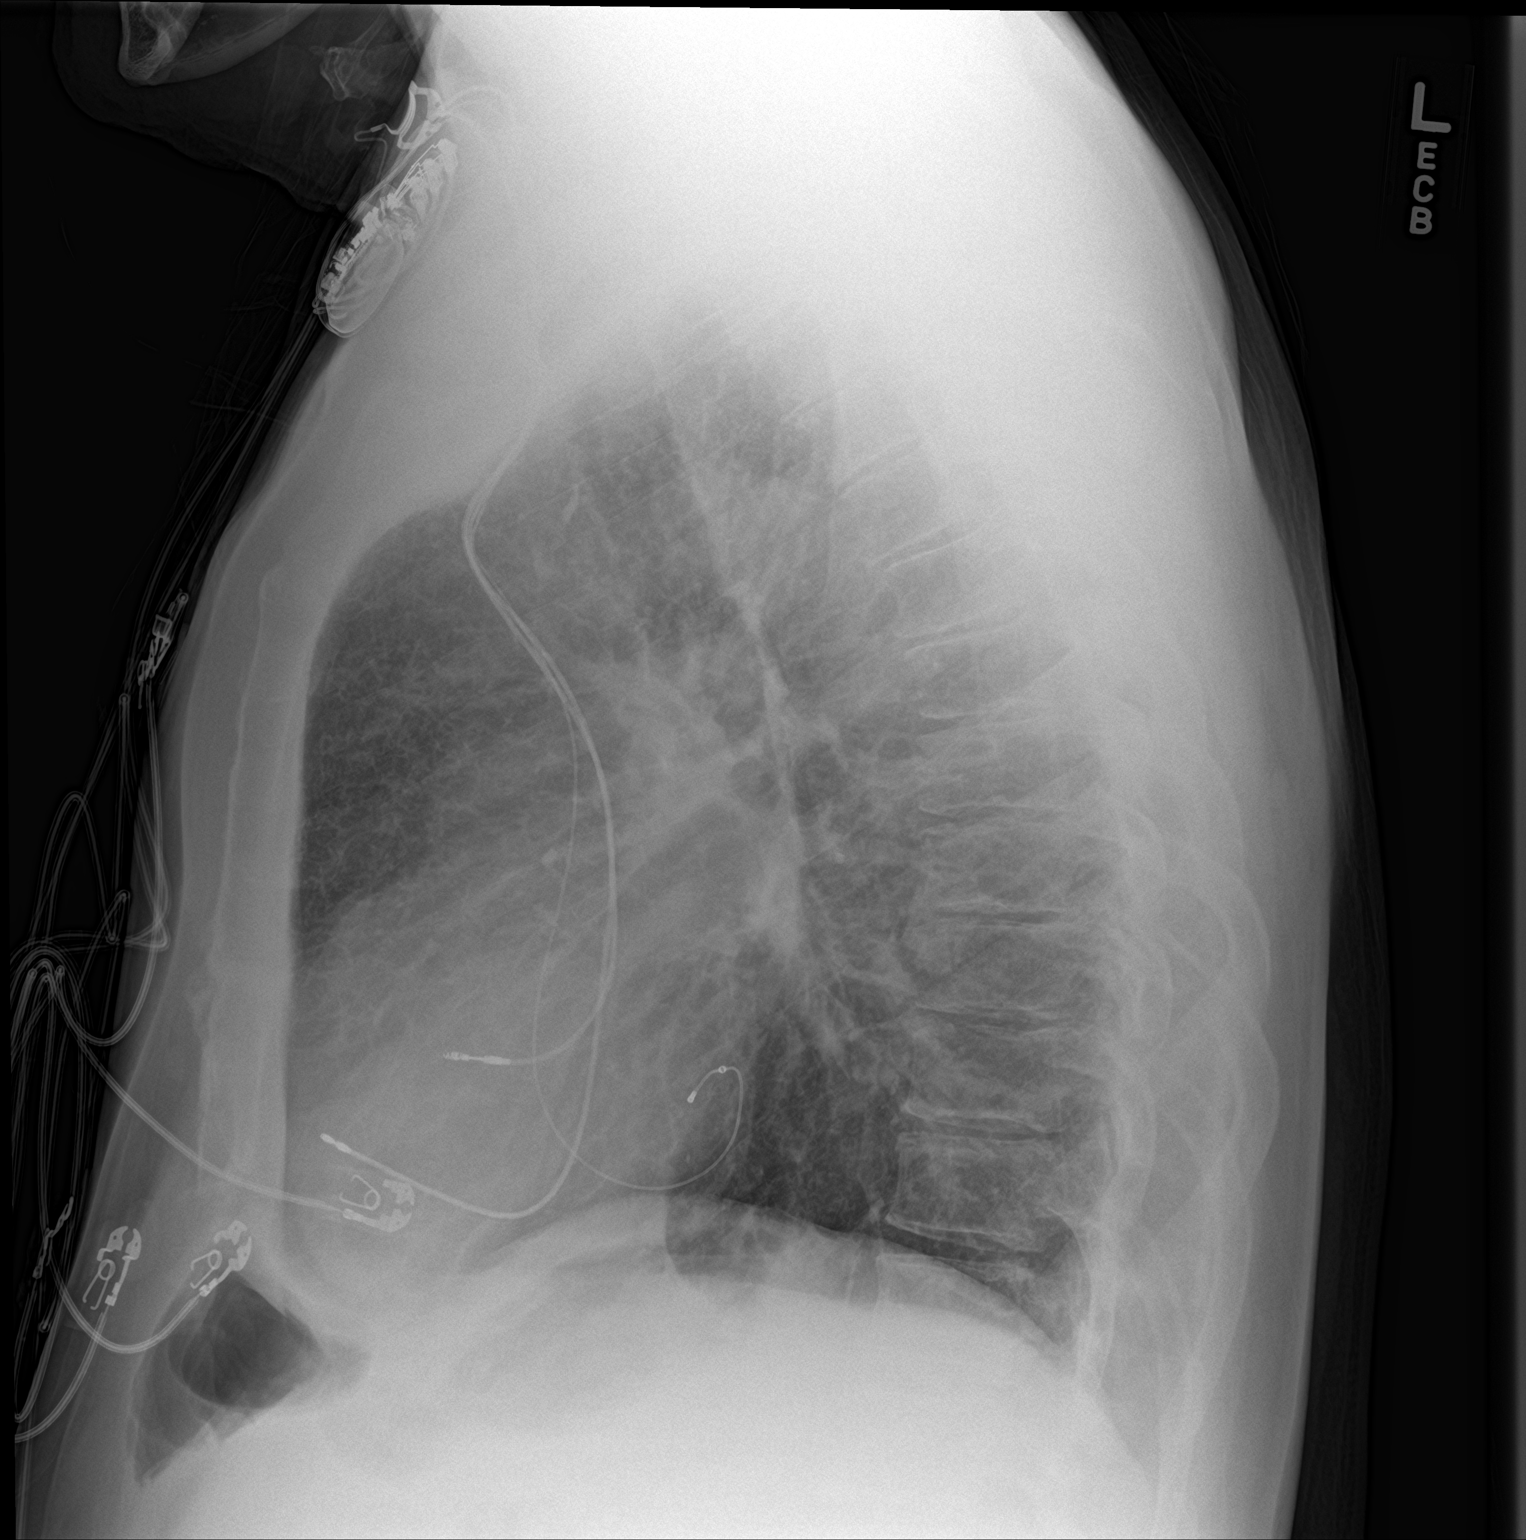

[2 of 2 positions shown; findings below may reference images not displayed]

FINDINGS: LEFT subclavian transvenous pacemaker with leads projecting at RIGHT
atrium, RIGHT ventricle, and coronary sinus.

Normal heart size, mediastinal contours, and pulmonary vascularity.

Lungs mildly hyperinflated but clear.

No pulmonary infiltrate, pleural effusion or pneumothorax.

Bones unremarkable.
IMPRESSION: Chronic mild pulmonary hyperinflation.

No acute infiltrate/abnormalities.

## 2020-06-23 ENCOUNTER — Telehealth: Payer: Self-pay

## 2020-06-23 NOTE — Telephone Encounter (Signed)
Patient sent picture of site via [facility] e-mail. Pacemaker site looks normal based on the picture provided. Transmission received 06/23/20 shows normal device function at this time. Advised patient of infection precautions and ED precautions if he has any chest pain, shortness of breath or any other concerning symptoms. Patient was advised if he had any continuing concerns about the pacemaker site, that he could send another transmission and to call the [facility] tomorrow 06/24/2020 for a possible appointment in [facility] to visually look at the site if needed. Patient advised if any issues that he would follow precautions.

## 2020-06-23 NOTE — Telephone Encounter (Signed)
The pt dog jumped on his chest this morning and his nail scratched him wear his pacemaker at. It kind of moved the pacemaker slightly. He sent a transmission for the nurse to review. I let him speak with the device nurse Janett Billow, rn.

## 2020-06-23 NOTE — Telephone Encounter (Signed)
Reviewed patient's transmission and transmission shows normal device function at this time. Patient states that the site of the pacemaker appears raised after his dog jumped on him. Patient states that he will e-mail a picture of the site for further assessment. Advised patient that he would receive a follow up call after the [facility] reviews the picture of the pacemaker site.

## 2020-08-17 NOTE — Progress Notes (Signed)
Cardiology Office Note Date:  08/18/2020  Patient ID:  Mike Bean, Mike Bean June 20, 1960, MRN [id number] PCP:  Patient, No Pcp Per (Inactive)  Electrophysiologist: Dr. Lovena Le    Chief Complaint: pt concerned pacer has moved  History of Present Illness: Mike Bean is a 60 y.o. 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 Lovena Le, last seen by him Jan 2022 via tele health visit.  E was doing well, discussed aware he drinks, smokes to much, as well as dietary liberalization. Difficulty with lasix having to stop at work to use the BR  TODAY He comes with a number of concerns/symptoms 1. 2-3 mo ago his dog jumped up on him and he felt like it jarred/moved his PPM and sine then just has not felt the same  and since then::  2. "I think I have or am getting  Gout"     2/2 R foot wound 3. B/l feet pain intermittently bottom of his feet feel like he is walking on hot glass, and intermittently has has random tenderness to his toes, any of the toes intermittently, usually left worse then right     No complaints of claudication       4. Dizzy spinning sensation, interimttently     Definitely a motion sensation, "like when you drink to much and have to put your foot on the floor to stop the room spinning"      5. Generalized joint aches pains  6. "I am pretty sure I have COPD"  7. "I dont think this device is working as well as the last"  Generally less energy, tired      Wears out quicker  admits to ongoing smoking about a pack per day and drinks 3-4 beers a day Admits to intermittently, sometimes more regularly missing his Eliquis, just forgets    Device information STM CRT-P implanted 06/27/2010, gen change, 02/08/2020 Programmed VVI   Past Medical History:  Diagnosis Date  . CHF (congestive heart failure) ([place])   . History of alcohol abuse   . Hyperlipidemia   . 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 Pacemaker 2012  . Persistent atrial fibrillation ([place]) 2012  . Snores     Past Surgical History:  Procedure Laterality Date  . APPENDECTOMY     S/P  . BIV PACEMAKER GENERATOR CHANGEOUT N/A 02/08/2020   Procedure: BIV PACEMAKER GENERATOR CHANGEOUT;  Surgeon: Evans Lance, MD;  Location: [facility];  Service: Cardiovascular;  Laterality: N/A;  . CARDIAC CATHETERIZATION  11/2009   Normal coronary arteries   . PACEMAKER INSERTION  2012   [facility]    Current Outpatient Medications  Medication Sig Dispense Refill  . albuterol (PROVENTIL) (2.5 MG/3ML) 0.083% nebulizer solution Take 3 mLs (2.5 mg total) by nebulization every 6 (six) hours as needed for wheezing or shortness of breath. 75 mL 12  . albuterol (VENTOLIN HFA) 108 (90 Base) MCG/ACT inhaler Inhale 2 puffs into the lungs every 6 (six) hours as needed for wheezing or shortness of breath.    Marland Kitchen apixaban (ELIQUIS) 5 MG TABS tablet Take 1 tablet (5 mg total) by mouth 2 (two) times daily. 180 tablet 1  . Coenzyme Q10 (CO Q 10) 100 MG CAPS Take 100 ng by mouth daily.     . furosemide (LASIX) 40 MG tablet Take  1 tablet (40 mg total) by mouth as needed. 90 tablet 3  . Omega-3 Fatty Acids (FISH OIL CONCENTRATE) 300 MG CAPS Take 300 mg by mouth daily.      No current facility-administered medications for this visit.    Allergies:   Patient has no known allergies.   Social History:  The patient  reports that he has been smoking cigarettes. He has been smoking about 0.25 packs per day. He has never used smokeless tobacco. He reports current alcohol use of about 21.0 standard drinks of alcohol per week. He reports that he does not use drugs.   Family History:  The patient's family history includes Heart attack (age of onset: 26) in his father.  ROS:  Please see the history of present illness.    All other systems are reviewed and otherwise negative.   PHYSICAL EXAM:   VS:  BP (!) 142/84   Pulse 87   Ht 5' 10.5" (1.791 m)   Wt 208 lb 9.6 oz (94.6 kg)   SpO2 97%   BMI 29.51 kg/m  BMI: Body mass index is 29.51 kg/m. Well nourished, well developed, in no acute distress HEENT: normocephalic, atraumatic Neck: no JVD, carotid bruits or masses Cardiac:  RRR (paced); no significant murmurs, no rubs, or gallops Lungs:  CTA b/l, very slight need exp wheez b/l, moves air well, rhonchi or rales Abd: soft, nontender MS: no deformity or atrophy, I do not appreciated any clear gouty changes feet, no erythematous changes, joint swelling Ext: RLE with more varicose veins then left, no edema, chronic looking skin changes, strong pedal pulses Left foot, 1-2+ on the right.  He has a small scabbed wound on his heel, no erythema, drainage, does not appear infected Skin: warm and dry, no rash Neuro:  No gross deficits appreciated Psych: euthymic mood, full affect  PPM site is stable, no tethering or discomfort   EKG:  Done today and reviewed by myself AF v paced, unchanged morphology from pre-gen change  Device interrogation done today and reviewed by myself:  Battery and lead measurements are stable mp HVR >99% BP   11/19/2017: stress myoview  Nuclear stress EF: 41%.  There was no ST segment deviation noted during stress.  This is an intermediate risk study.   Intermediate risk stress nuclear study due to reduced left ventricular global systolic function. There is normal perfusion. Nonischemic cardiomyopathy is favored as a cause of left ventricular dysfunction.    06/11/2012: TTE Study Conclusions  - Left ventricle: The cavity size was mildly dilated.  Systolic function was moderately reduced. The estimated  ejection fraction was in the range of 35% to 40% and has  improved from 25%, Diffuse hypokinesis. The tissue Doppler  parameters were normal. The study is not technically  sufficient to allow evaluation of LV diastolic function.  -  Mitral valve: Mild regurgitation.  - Atrial septum: No defect or patent foramen ovale was  identified.    Recent Labs: 01/26/2020: BUN 10; Creatinine, Ser 1.05; Hemoglobin 17.1; Platelets 191; Potassium 4.4; Sodium 139  No results found for requested labs within last 8760 hours.   CrCl cannot be calculated (Patient's most recent lab result is older than the maximum 21 days allowed.).   Wt Readings from Last 3 Encounters:  08/18/20 208 lb 9.6 oz (94.6 kg)  02/08/20 215 lb (97.5 kg)  01/26/20 218 lb (98.9 kg)     Other studies reviewed: Additional studies/records reviewed today include: summarized above  ASSESSMENT AND PLAN:  1. CRT-P     Intact function     Site/pocket looks OK, skin is intact  He is very worried about something happening to his system at the dog jumping up on him I told him the dvice was working well and did not think so, though he was fairly persistent and will get a CXR to evaluate lead positioning And his concern of COPD  2. Permanent Afib     CHA2DS2Vasc is one, on Eliquis, appropriately dosed      Urged compliance with his eliquis and discussed importance of it AFTER the patient left, given some intermittent verigo-like symptoms, and poor compliance with his Eliqus, doing an MRI, though his sytem is not compatible I will ask my MA to call and offer him to do a CT scan though he already was not to on board with the testing I had suggested.  3. NICM     No exam findings of volume OL     CorVue wobbles about baseline, up/down     Update his echo  4. LE symptoms sound of neuropathy     He has uneven pedal pulses     Get ABI/vasc US done  I have urged him to get a PMD and given information for [facility] to evaluate for COPD, his c/o generalized joint pains, and suspect neuropathy, ? vertigo I have urged him to reduce > stop smoking and drinking Get labs today   Disposition: F/u with 3-4 weeks pending test results  Current medicines are  reviewed at length with the patient today.  The patient did not have any concerns regarding medicines.  Venetia Night, PA-C 08/18/2020 7:01 PM     [facility] [address] [place] [id number] [phone] (office)  [phone] (fax)

## 2020-08-18 ENCOUNTER — Other Ambulatory Visit: Payer: Self-pay

## 2020-08-18 ENCOUNTER — Encounter: Payer: Self-pay | Admitting: Physician Assistant

## 2020-08-18 ENCOUNTER — Ambulatory Visit (INDEPENDENT_AMBULATORY_CARE_PROVIDER_SITE_OTHER): Payer: PRIVATE HEALTH INSURANCE | Admitting: Physician Assistant

## 2020-08-18 ENCOUNTER — Ambulatory Visit (INDEPENDENT_AMBULATORY_CARE_PROVIDER_SITE_OTHER): Payer: PRIVATE HEALTH INSURANCE

## 2020-08-18 VITALS — BP 142/84 | HR 87 | Ht 70.5 in | Wt 208.6 lb

## 2020-08-18 DIAGNOSIS — I428 Other cardiomyopathies: Secondary | ICD-10-CM | POA: Diagnosis not present

## 2020-08-18 DIAGNOSIS — R42 Dizziness and giddiness: Secondary | ICD-10-CM

## 2020-08-18 DIAGNOSIS — Z950 Presence of cardiac pacemaker: Secondary | ICD-10-CM | POA: Diagnosis not present

## 2020-08-18 DIAGNOSIS — I429 Cardiomyopathy, unspecified: Secondary | ICD-10-CM

## 2020-08-18 DIAGNOSIS — M79605 Pain in left leg: Secondary | ICD-10-CM

## 2020-08-18 DIAGNOSIS — M79604 Pain in right leg: Secondary | ICD-10-CM

## 2020-08-18 DIAGNOSIS — I5022 Chronic systolic (congestive) heart failure: Secondary | ICD-10-CM | POA: Diagnosis not present

## 2020-08-18 DIAGNOSIS — R0600 Dyspnea, unspecified: Secondary | ICD-10-CM | POA: Diagnosis not present

## 2020-08-18 DIAGNOSIS — I4821 Permanent atrial fibrillation: Secondary | ICD-10-CM

## 2020-08-18 LAB — CUP PACEART INCLINIC DEVICE CHECK
Battery Remaining Longevity: 86 mo
Battery Voltage: 2.99 V
Brady Statistic RA Percent Paced: 0 %
Brady Statistic RV Percent Paced: 99.18 %
Date Time Interrogation Session: 20220421192815
Implantable Lead Implant Date: 20120228
Implantable Lead Implant Date: 20120228
Implantable Lead Implant Date: 20120228
Implantable Lead Location: 753858
Implantable Lead Location: 753859
Implantable Lead Location: 753860
Implantable Lead Model: 4196
Implantable Pulse Generator Implant Date: 20211011
Lead Channel Pacing Threshold Amplitude: 0.75 V
Lead Channel Pacing Threshold Amplitude: 0.75 V
Lead Channel Pacing Threshold Amplitude: 1.25 V
Lead Channel Pacing Threshold Amplitude: 1.25 V
Lead Channel Pacing Threshold Pulse Width: 0.5 ms
Lead Channel Pacing Threshold Pulse Width: 0.5 ms
Lead Channel Pacing Threshold Pulse Width: 0.8 ms
Lead Channel Pacing Threshold Pulse Width: 0.8 ms
Lead Channel Sensing Intrinsic Amplitude: 12 mV
Lead Channel Setting Pacing Amplitude: 2 V
Lead Channel Setting Pacing Amplitude: 2.5 V
Lead Channel Setting Pacing Pulse Width: 0.5 ms
Lead Channel Setting Pacing Pulse Width: 0.8 ms
Lead Channel Setting Sensing Sensitivity: 4 mV
Pulse Gen Model: 3222
Pulse Gen Serial Number: 3859402

## 2020-08-18 LAB — CUP PACEART REMOTE DEVICE CHECK
Battery Remaining Longevity: 89 mo
Battery Remaining Percentage: 95.5 %
Battery Voltage: 2.99 V
Date Time Interrogation Session: 20220421040027
Implantable Lead Implant Date: 20120228
Implantable Lead Implant Date: 20120228
Implantable Lead Implant Date: 20120228
Implantable Lead Location: 753858
Implantable Lead Location: 753859
Implantable Lead Location: 753860
Implantable Lead Model: 4196
Implantable Pulse Generator Implant Date: 20211011
Lead Channel Impedance Value: 460 Ohm
Lead Channel Impedance Value: 760 Ohm
Lead Channel Pacing Threshold Amplitude: 0.75 V
Lead Channel Pacing Threshold Amplitude: 1.25 V
Lead Channel Pacing Threshold Pulse Width: 0.5 ms
Lead Channel Pacing Threshold Pulse Width: 0.8 ms
Lead Channel Sensing Intrinsic Amplitude: 12 mV
Lead Channel Setting Pacing Amplitude: 2 V
Lead Channel Setting Pacing Amplitude: 2.5 V
Lead Channel Setting Pacing Pulse Width: 0.5 ms
Lead Channel Setting Pacing Pulse Width: 0.8 ms
Lead Channel Setting Sensing Sensitivity: 4 mV
Pulse Gen Model: 3222
Pulse Gen Serial Number: 3859402

## 2020-08-18 NOTE — Patient Instructions (Addendum)
Medication Instructions:   Your physician recommends that you continue on your current medications as directed. Please refer to the Current Medication list given to you today.   *If you need a refill on your cardiac medications before your next appointment, please call your pharmacy*   Lab Work:  CMET AND CBC TODAY   If you have labs (blood work) drawn today and your tests are completely normal, you will receive your results only by: Marland Kitchen MyChart Message (if you have MyChart) OR . A paper copy in the mail If you have any lab test that is abnormal or we need to change your treatment, we will call you to review the results.   Testing/Procedures:     Your physician has requested that you have an ankle brachial index (ABI). During this test an ultrasound and blood pressure cuff are used to evaluate the arteries that supply the arms and legs with blood. Allow thirty minutes for this exam. There are no restrictions or special instructions.  Your physician has requested that you have an echocardiogram. Echocardiography is a painless test that uses sound waves to create images of your heart. It provides your doctor with information about the size and shape of your heart and how well your heart's chambers and valves are working. This procedure takes approximately one hour. There are no restrictions for this procedure.  A chest x-ray takes a picture of the organs and structures inside the chest, including the heart, lungs, and blood vessels. This test can show several things, including, whether the heart is enlarges; whether fluid is building up in the lungs; and whether pacemaker / defibrillator leads are still in place.  Located in: [facility] Address: [address], [place], [place] [address] Areas served: [place] and nearby areas Hours:   Tuesday 8AM-5:30PM Wednesday 8AM-5:30PM Thursday 8AM-5:30PM Friday 8AM-5:30PM Saturday Closed Sunday Closed Monday 8AM-5:30PM Phone: [phone]   Follow-Up: At [facility], you and your health needs are our priority.  As part of our continuing mission to provide you with exceptional heart care, we have created designated Provider Care Teams.  These Care Teams include your primary Cardiologist (physician) and Advanced Practice Providers (APPs -  Physician Assistants and Nurse Practitioners) who all work together to provide you with the care you need, when you need it.  We recommend signing up for the patient portal called "MyChart".  Sign up information is provided on this After Visit Summary.  MyChart is used to connect with patients for Virtual Visits (Telemedicine).  Patients are able to view lab/test results, encounter notes, upcoming appointments, etc.  Non-urgent messages can be sent to your provider as well.   To learn more about what you can do with MyChart, go to [email or web address].    Your next appointment:   3 month(s)  The format for your next appointment:   In Person  Provider:   [name], MD   Other Instructions  [place]

## 2020-08-24 ENCOUNTER — Other Ambulatory Visit: Payer: Self-pay | Admitting: Physician Assistant

## 2020-08-24 DIAGNOSIS — M79605 Pain in left leg: Secondary | ICD-10-CM

## 2020-08-24 DIAGNOSIS — M79604 Pain in right leg: Secondary | ICD-10-CM

## 2020-08-29 ENCOUNTER — Ambulatory Visit (HOSPITAL_COMMUNITY): Payer: PRIVATE HEALTH INSURANCE

## 2020-08-31 ENCOUNTER — Ambulatory Visit (HOSPITAL_COMMUNITY)
Admission: RE | Admit: 2020-08-31 | Discharge: 2020-08-31 | Disposition: A | Discharge status: Home / Self Care | Payer: PRIVATE HEALTH INSURANCE | Source: Ambulatory Visit | Attending: Cardiovascular Disease | Admitting: Cardiovascular Disease

## 2020-08-31 ENCOUNTER — Other Ambulatory Visit: Payer: Self-pay

## 2020-08-31 DIAGNOSIS — M79605 Pain in left leg: Secondary | ICD-10-CM | POA: Insufficient documentation

## 2020-08-31 DIAGNOSIS — M79604 Pain in right leg: Secondary | ICD-10-CM | POA: Diagnosis not present

## 2020-09-06 NOTE — Progress Notes (Signed)
Remote pacemaker transmission.   

## 2020-09-12 ENCOUNTER — Other Ambulatory Visit: Payer: Self-pay

## 2020-09-12 ENCOUNTER — Ambulatory Visit (HOSPITAL_COMMUNITY): Payer: PRIVATE HEALTH INSURANCE | Attending: Physician Assistant

## 2020-09-12 DIAGNOSIS — R0600 Dyspnea, unspecified: Secondary | ICD-10-CM | POA: Diagnosis present

## 2020-09-12 LAB — ECHOCARDIOGRAM COMPLETE: S' Lateral: 4.5 cm

## 2020-11-17 ENCOUNTER — Ambulatory Visit (INDEPENDENT_AMBULATORY_CARE_PROVIDER_SITE_OTHER): Payer: PRIVATE HEALTH INSURANCE

## 2020-11-17 DIAGNOSIS — I428 Other cardiomyopathies: Secondary | ICD-10-CM

## 2020-11-17 LAB — CUP PACEART REMOTE DEVICE CHECK
Battery Remaining Longevity: 83 mo
Battery Remaining Percentage: 94 %
Battery Voltage: 2.99 V
Date Time Interrogation Session: 20220721040036
Implantable Lead Implant Date: 20120228
Implantable Lead Implant Date: 20120228
Implantable Lead Implant Date: 20120228
Implantable Lead Location: 753858
Implantable Lead Location: 753859
Implantable Lead Location: 753860
Implantable Lead Model: 4196
Implantable Pulse Generator Implant Date: 20211011
Lead Channel Impedance Value: 460 Ohm
Lead Channel Impedance Value: 740 Ohm
Lead Channel Pacing Threshold Amplitude: 0.75 V
Lead Channel Pacing Threshold Amplitude: 1.25 V
Lead Channel Pacing Threshold Pulse Width: 0.5 ms
Lead Channel Pacing Threshold Pulse Width: 0.8 ms
Lead Channel Sensing Intrinsic Amplitude: 12 mV
Lead Channel Setting Pacing Amplitude: 2 V
Lead Channel Setting Pacing Amplitude: 2.5 V
Lead Channel Setting Pacing Pulse Width: 0.5 ms
Lead Channel Setting Pacing Pulse Width: 0.8 ms
Lead Channel Setting Sensing Sensitivity: 4 mV
Pulse Gen Model: 3222
Pulse Gen Serial Number: 3859402

## 2020-11-22 ENCOUNTER — Other Ambulatory Visit: Payer: Self-pay

## 2020-11-22 ENCOUNTER — Encounter: Payer: Self-pay | Admitting: Internal Medicine

## 2020-11-22 ENCOUNTER — Ambulatory Visit (INDEPENDENT_AMBULATORY_CARE_PROVIDER_SITE_OTHER): Payer: PRIVATE HEALTH INSURANCE | Admitting: Internal Medicine

## 2020-11-22 VITALS — BP 128/88 | HR 88 | Ht 70.5 in | Wt 204.8 lb

## 2020-11-22 DIAGNOSIS — Z950 Presence of cardiac pacemaker: Secondary | ICD-10-CM | POA: Diagnosis not present

## 2020-11-22 DIAGNOSIS — I4891 Unspecified atrial fibrillation: Secondary | ICD-10-CM

## 2020-11-22 DIAGNOSIS — I5022 Chronic systolic (congestive) heart failure: Secondary | ICD-10-CM

## 2020-11-22 MED ORDER — ENTRESTO 24-26 MG PO TABS: 1.0000 | ORAL_TABLET | Freq: Two times a day (BID) | ORAL | 11 refills | Status: DC

## 2020-11-22 NOTE — Patient Instructions (Addendum)
Medication Instructions:  Your physician has recommended you make the following change in your medication:    START taking Entresto 24-26-  Take one tablet by mouth twice a day   You will need a follow up visit with pharmacist for further titration and lab work.   Labwork: None ordered.  Testing/Procedures: None ordered.  Follow-Up: Your physician wants you to follow-up in: 6 months with Mike Peru, MD or one of the following Advanced Practice Providers on your designated Care Team:   Mike Bean, Mike Bean "Mike Bean" Chalmers Cater, Mike  Remote monitoring is used to monitor your Pacemaker from home. This monitoring reduces the number of office visits required to check your device to one time per year. It allows [place] to keep an eye on the functioning of your device to ensure it is working properly. You are scheduled for a device check from home on 02/16/2021. You may send your transmission at any time that day. If you have a wireless device, the transmission will be sent automatically. After your physician reviews your transmission, you will receive a postcard with your next transmission date.  Any Other Special Instructions Will Be Listed Below (If Applicable).  If you need a refill on your cardiac medications before your next appointment, please call your pharmacy.   Sacubitril; Valsartan Oral Tablets What is this medication? SACUBITRIL; VALSARTAN (sak UE bi tril; val SAR tan) is a combination of a neprilysin inhibitor and a an angiotensin II receptor blocker. It treats heartfailure. This medicine may be used for other purposes; ask your health care provider orpharmacist if you have questions. COMMON BRAND NAME(S): Entresto What should I tell my care team before I take this medication? They need to know if you have any of these conditions: diabetes and take a medicine that contains aliskiren high levels of potassium in the blood kidney disease liver disease low blood pressure an  unusual or allergic reaction to sacubitril; valsartan, drugs called angiotensin converting enzyme (ACE) inhibitors, angiotensin II receptor blockers (ARBs), other medicines, foods, dyes, or preservatives pregnant or trying to get pregnant breast-feeding How should I use this medication? Take this medicine by mouth. Take it as directed on the prescription label at the same time every day. You can take it with or without food. If it upsets your stomach, take it with food. Keep taking it unless your health careprovider tells you to stop. Talk to your health care provider about the use of this drug in children. While it may be prescribed for children as young as 1 for selected conditions,precautions do apply. Overdosage: If you think you have taken too much of this medicine contact apoison control center or emergency room at once. NOTE: This medicine is only for you. Do not share this medicine with others. What if I miss a dose? If you miss a dose, take it as soon as you can. If it is almost time for yournext dose, take only that dose. Do not take double or extra doses. What may interact with this medication? Do not take this medicine with any of the following medicines: aliskiren if you have diabetes angiotensin-converting enzyme (ACE) inhibitors, like benazepril, captopril, enalapril, fosinopril, lisinopril, or ramipril tranylcypromine This medicine may also interact with the following medicines: angiotensin II receptor blockers (ARBs) like azilsartan, candesartan, eprosartan, irbesartan, losartan, olmesartan, telmisartan, or valsartan celecoxib lithium NSAIDS, medicines for pain and inflammation, like ibuprofen or naproxen potassium-sparing diuretics like amiloride, spironolactone, and triamterene potassium supplements This list may not describe all possible interactions.  Give your health care provider a list of all the medicines, herbs, non-prescription drugs, or dietary supplements you use.  Also tell them if you smoke, drink alcohol, or use illegaldrugs. Some items may interact with your medicine. What should I watch for while using this medication? Tell your doctor or health care provider if your symptoms do not start to getbetter or if they get worse. Do not become pregnant while taking this medicine. Women should inform their health care provider if they wish to become pregnant or think they might be pregnant. There is a potential for serious harm to an unborn child. Talk toyour health care provider for more information. You may get drowsy or dizzy. Do not drive, use machinery, or do anything that needs mental alertness until you know how this medicine affects you. Do not stand or sit up quickly, especially if you are an older patient. This reducesthe risk of dizzy or fainting spells. Alcohol may interfere with the effects of this medicine. Avoid alcoholic drinks. Avoid salt substitutes unless you are told otherwise by your health careprovider. What side effects may I notice from receiving this medication? Side effects that you should report to your doctor or health care provider assoon as possible: allergic reactions (skin rash, itching or hives; swelling of the face, lips, or tongue) high potassium levels (chest pain; fast, irregular heartbeat; muscle weakness) kidney injury (trouble passing urine or change in the amount of urine) low blood pressure (dizziness; feeling faint or lightheaded, falls; unusually weak or tired) Side effects that usually do not require medical attention (report to yourdoctor or health care provider if they continue or are bothersome): cough This list may not describe all possible side effects. Call your doctor for medical advice about side effects. You may report side effects to FDA at1-800-FDA-1088. Where should I keep my medication? Keep out of the reach of children and pets. Store at room temperature between 20 and 25 degrees C (68 and 77 degrees F).  Protect from moisture. Keep the container tightly closed. Get rid of any unusedmedicine after the expiration date. To get rid of medicines that are no longer needed or have expired: Take the medicine to a take-back program. Check with your pharmacy or law enforcement to find a location. If you cannot return the medicine, check the label or package insert to see if the medicine should be thrown out in the garbage or flushed down the toilet. If you are not sure, ask your health care provider. If it is safe to put it in the trash, empty the medicine out of the container. Mix the medicine with cat litter, dirt, coffee grounds, or other unwanted substance. Seal the mixture in a bag or container. Put it in the trash. NOTE: This sheet is a summary. It may not cover all possible information. If you have questions about this medicine, talk to your doctor, pharmacist, orhealth care provider.  2022 Elsevier/[employer] Bean (2019-06-22 11:23:32)

## 2020-11-22 NOTE — Progress Notes (Signed)
HPI Mike Bean returns today for followup. He is a pleasant middle aged man with uncontrolled atrial fib s/p AV node ablation, and biv PPM insertion. He notes that since his device was changed out he has had more sob and generalized fatigue. He has not had syncope or palpitations. He denies chest pain. He has class 2B symptoms. In the past he has been reluctant to stop smoking or drinking. His EF was 25-30% by echo. He has minimal edema.   No Known Allergies   Current Outpatient Medications  Medication Sig Dispense Refill   albuterol (PROVENTIL) (2.5 MG/3ML) 0.083% nebulizer solution Take 3 mLs (2.5 mg total) by nebulization every 6 (six) hours as needed for wheezing or shortness of breath. 75 mL 12   albuterol (VENTOLIN HFA) 108 (90 Base) MCG/ACT inhaler Inhale 2 puffs into the lungs every 6 (six) hours as needed for wheezing or shortness of breath.     apixaban (ELIQUIS) 5 MG TABS tablet Take 1 tablet (5 mg total) by mouth 2 (two) times daily. 180 tablet 1   Coenzyme Q10 (CO Q 10) 100 MG CAPS Take 100 ng by mouth daily.      Omega-3 Fatty Acids (FISH OIL CONCENTRATE) 300 MG CAPS Take 300 mg by mouth daily.      furosemide (LASIX) 40 MG tablet Take 1 tablet (40 mg total) by mouth as needed. 90 tablet 3   No current facility-administered medications for this visit.     Past Medical History:  Diagnosis Date   CHF (congestive heart failure) ([place])    History of alcohol abuse    Hyperlipidemia    Nonischemic cardiomyopathy ([place])    Presumed to be tachycardia mediated   Pacemaker 2012   Persistent atrial fibrillation ([place]) 2012   Snores     ROS:   All systems reviewed and negative except as noted in the HPI.   Past Surgical History:  Procedure Laterality Date   APPENDECTOMY     S/P   BIV PACEMAKER GENERATOR CHANGEOUT N/A 02/08/2020   Procedure: BIV PACEMAKER GENERATOR CHANGEOUT;  Surgeon: Evans Lance, MD;  Location: [facility];  Service: Cardiovascular;   Laterality: N/A;   CARDIAC CATHETERIZATION  11/2009   Normal coronary arteries    PACEMAKER INSERTION  2012   [facility]e     Family History  Problem Relation Age of Onset   Heart attack Father 59       MI   Colon cancer Neg Hx      Social History   Socioeconomic History   Marital status: Divorced    Spouse name: Not on file   Number of children: 2   Years of education: Not on file   Highest education level: Not on file  Occupational History   Occupation: Works in Scientist, clinical (histocompatibility and immunogenetics): [employer] Use   Smoking status: Every Day    Packs/day: 0.25    Types: Cigarettes   Smokeless tobacco: Never  Vaping Use   Vaping Use: Never used  Substance and Sexual Activity   Alcohol use: Yes    Alcohol/week: 21.0 standard drinks    Types: 21 Standard drinks or equivalent per week    Comment: At least 3 beers a day   Drug use: No   Sexual activity: Not on file  Other Topics Concern   Not on file  Social History Narrative   Lives in [place]   Separated   2 children,  no grandchildren   Social Determinants of Radio broadcast assistant Strain: Not on file  Food Insecurity: Not on file  Transportation Needs: Not on file  Physical Activity: Not on file  Stress: Not on file  Social Connections: Not on file  Intimate Partner Violence: Not on file     BP 128/88   Pulse 88   Ht 5' 10.5" (1.791 m)   Wt 204 lb 12.8 oz (92.9 kg)   BMI 28.97 kg/m   Physical Exam:  Well appearing NAD HEENT: Unremarkable Neck:  No JVD, no thyromegally Lymphatics:  No adenopathy Back:  No CVA tenderness Lungs:  Clear with no wheezes HEART:  Regular rate rhythm, no murmurs, no rubs, no clicks Abd:  soft, positive bowel sounds, no organomegally, no rebound, no guarding Ext:  2 plus pulses, no edema, no cyanosis, no clubbing Skin:  No rashes no nodules Neuro:  CN II through XII intact, motor grossly intact  DEVICE  Normal device function.  See PaceArt for details.    Assess/Plan:  Chronic systolic heart failure - his symptoms are class 2B. I have asked him to start low dose Entresto. In the future he will need to add a low dose beta blocker as tolerated. Atrial fib - his VR is well controlled.  Tobacco abuse - I strongly encouraged him to stop smoking.  PPM - his [employer] biv PPM is working normally. We will recheck in several months.  Carleene Overlie Derrian Poli,MD

## 2020-12-01 ENCOUNTER — Telehealth: Payer: Self-pay

## 2020-12-01 NOTE — Telephone Encounter (Signed)
Patient called in stating he is going to send in a transmission, he would like a nurse to review it and give him a call. Patient states he is on new meds and he has been very dizzy lately to the point he cannot drive and he wants to make sure it is not heart related before he goes to pcp. Patient is aware a nurse will call once that transmission is received

## 2020-12-01 NOTE — Telephone Encounter (Signed)
Manual remote transmission received and reviewed. No episodes noted that would cause s/s of dizziness. Denies additional cardiac symptoms. Presenting EGM AF/BP'@86'$ . Patient admits to N/V x 1 this am and just feels ill. He is wondering if he has Covid. Has home test and RN encouraged use. He does not have a BP cuff at home. He will call PCP for appointment.

## 2020-12-02 ENCOUNTER — Telehealth: Payer: Self-pay

## 2020-12-02 NOTE — Telephone Encounter (Signed)
Yes Mike Bean can make patient's dizzy, especially when first starting but unfortunately he can not cut the tablets in half.

## 2020-12-02 NOTE — Telephone Encounter (Signed)
The patient states that Truddie Crumble told him to call with his blood pressure: 150/80 bpm. He feels a lot better today than he did yesterday. He believes the entresto is what making him dizzy. I told him that I will have a nurse give him a call back.

## 2020-12-02 NOTE — Telephone Encounter (Signed)
Pt started Entresto 5-6 days ago.  He states since starting medication, he has felt dizzy.  Pt sent a manual pacemaker transmission yesterday which shows no arrhythmias  (see 12/01/20 epic encounter).   Pt went and had BP checked- results were 150/80 which is high for him.  He is going to purchase a BP cuff today to continue monitoring.  Advised issue is currently under review with Dr. Lovena Le and pharmacy

## 2020-12-05 ENCOUNTER — Telehealth: Payer: Self-pay

## 2020-12-05 NOTE — Telephone Encounter (Signed)
Tried to call Pt back.  Call went to VM.  VM was full and unable to leave a message.

## 2020-12-08 ENCOUNTER — Ambulatory Visit: Payer: PRIVATE HEALTH INSURANCE

## 2020-12-09 NOTE — Telephone Encounter (Signed)
Returned call to Pt.  Advised per Dr. Lovena Le-  Stop lasix.  Continue Entresto.  If dizzy eat a little more salt.  Dizziness should improve.  Pt indicates understanding.

## 2020-12-09 NOTE — Progress Notes (Signed)
Remote pacemaker transmission.   

## 2020-12-09 NOTE — Telephone Encounter (Signed)
Patient called back stating he missed his blood work appt. Patient wants to know if he should be taking fish oil with a blood thinner? Patient also states since he has been on higher dose of entresto he has been feeling dizzy and lightheaded and is concerned. He would like a call back

## 2020-12-21 ENCOUNTER — Ambulatory Visit (INDEPENDENT_AMBULATORY_CARE_PROVIDER_SITE_OTHER): Payer: PRIVATE HEALTH INSURANCE | Admitting: Pharmacist

## 2020-12-21 ENCOUNTER — Other Ambulatory Visit: Payer: Self-pay

## 2020-12-21 VITALS — BP 114/70 | HR 79 | Wt 205.0 lb

## 2020-12-21 DIAGNOSIS — I5022 Chronic systolic (congestive) heart failure: Secondary | ICD-10-CM | POA: Diagnosis not present

## 2020-12-21 MED ORDER — METOPROLOL SUCCINATE ER 25 MG PO TB24: 25.0000 mg | ORAL_TABLET | Freq: Every day | ORAL | 5 refills | Status: DC

## 2020-12-21 NOTE — Progress Notes (Signed)
Patient ID: Mike Bean                 DOB: [birth year]                      MRN: [id number]     HPI: Mike Bean is a 60 y.o. male referred by Dr. Lovena Bean to pharmacy clinic for HF medication management. PMH is significant for uncontrolled afib s/p AV node ablation and biv PPM insertion, CHF with LVEF 25-30% on 08/2020 echo, HLD, and tobacco/alcohol abuse. EF on 05/2012 TTE also reduced 35-40%. He was started on low dose Entresto at last visit with Dr Mike Bean on 11/22/20 and referred to PharmD for follow up management. Pt called in with reports of dizziness that he attributed to his Entresto, BP was elevated at 150/80. He was advised to stop his Lasix.  Today pt returns to pharmacy clinic for further medication titration. Reports tolerating medications well now. Dizziness has improved. Had only been using Lasix prn 1-2x per month. Home BP running 120/80s. Doesn't like taking medications. Filled Entresto for a 90 day supply for free. Smoking 1 PPD still, understands the importance of quitting. Has access to a health coach at work, wants to try hypnosis to help him quit smoking, which previously helped him stay quit for 3 years. Has been cutting back with alcohol use. Went 54 days without any alcohol, reports he has had 2 cases of beer total since mid April, some days doesn't drink at all.  Current CHF meds: Entresto 24-'26mg'$  BID  BP goal: <130/65mHg  Family History: Father with MI at 56  Social History: Smokes 1 PPD and drinks at least 3 beers a day.  Diet: Doesn't add salt to food but does like salty foods and has been working on trying to limit this  Exercise: None, wants to start bike riding 3-4 days a week  Wt Readings from Last 3 Encounters:  11/22/20 204 lb 12.8 oz (92.9 kg)  08/18/20 208 lb 9.6 oz (94.6 kg)  02/08/20 215 lb (97.5 kg)   BP Readings from Last 3 Encounters:  11/22/20 128/88  08/18/20 (!) 142/84  02/08/20 (!) 155/99   Pulse Readings from Last 3 Encounters:  11/22/20  88  08/18/20 87  02/08/20 74    Renal function: CrCl cannot be calculated (Patient's most recent lab result is older than the maximum 21 days allowed.).  Past Medical History:  Diagnosis Date   [place]    History of alcohol abuse    Hyperlipidemia    Nonischemic cardiomyopathy ([facility]    Presumed to be tachycardia mediated   Pacemaker 2012   Persistent atrial fibrillation ([place] 2012   Snores     Current Outpatient Medications on File Prior to Visit  Medication Sig Dispense Refill   albuterol (PROVENTIL) (2.5 MG/3ML) 0.083% nebulizer solution Take 3 mLs (2.5 mg total) by nebulization every 6 (six) hours as needed for wheezing or shortness of breath. 75 mL 12   albuterol (VENTOLIN HFA) 108 (90 Base) MCG/ACT inhaler Inhale 2 puffs into the lungs every 6 (six) hours as needed for wheezing or shortness of breath.     apixaban (ELIQUIS) 5 MG TABS tablet Take 1 tablet (5 mg total) by mouth 2 (two) times daily. 180 tablet 1   Coenzyme Q10 (CO Q 10) 100 MG CAPS Take 100 ng by mouth daily.      Omega-3 Fatty Acids (FISH OIL CONCENTRATE) 300 MG CAPS  Take 300 mg by mouth daily.      sacubitril-valsartan (ENTRESTO) 24-26 MG Take 1 tablet by mouth 2 (two) times daily. 60 tablet 11   No current facility-administered medications on file prior to visit.    No Known Allergies   Assessment/Plan:  1. CHF with EF 25-30% - BP at goal <130/73mHg, pt is currently only taking Entresto 24-'26mg'$  BID and no other GDMT for his CHF. Checking BMET today with recent Entresto start. Will start Toprol '25mg'$ , advised pt to take 1/2 tab daily for first week then increase to 1 tab daily. He will keep an eye on BP at home. I will call pt in 2-3 weeks to follow up with home readings and plan to add on SGLT2i at that time.  Mike Bean, PharmD, BCACP, [id number]. [address] [place] [id number]: [phone] Fax: [phone]/24/2022  4:16 PM

## 2020-12-21 NOTE — Patient Instructions (Signed)
It was nice to meet you today  Your blood pressure goal is < 130/27mHg  Start taking metoprolol (Toprol) - 1/2 tablet daily for 1 week ,then increase to 1 tablet daily  I'll call you in 2-3 weeks to follow up with your home blood pressure readings. Ideally, I would like to start either FIranor Jardiance to help your heart as well

## 2020-12-22 LAB — BASIC METABOLIC PANEL
BUN/Creatinine Ratio: 9 — ABNORMAL LOW (ref 10–24)
BUN: 11 mg/dL (ref 8–27)
CO2: 27 mmol/L (ref 20–29)
Calcium: 9.3 mg/dL (ref 8.6–10.2)
Chloride: 96 mmol/L (ref 96–106)
Creatinine, Ser: 1.19 mg/dL (ref 0.76–1.27)
Glucose: 75 mg/dL (ref 65–99)
Potassium: 4 mmol/L (ref 3.5–5.2)
Sodium: 138 mmol/L (ref 134–144)
eGFR: 69 mL/min/{1.73_m2} (ref >59)

## 2021-01-09 ENCOUNTER — Telehealth: Payer: Self-pay | Admitting: Pharmacist

## 2021-01-09 NOTE — Telephone Encounter (Signed)
Left message for pt to follow up with home BP readings since starting Toprol recently. Will plan to add SGLT2i for CHF benefit if able.

## 2021-01-18 MED ORDER — METOPROLOL SUCCINATE ER 50 MG PO TB24: 50.0000 mg | ORAL_TABLET | Freq: Every day | ORAL | 5 refills | Status: DC

## 2021-01-18 NOTE — Telephone Encounter (Signed)
Pt returned call to clinic. Reports BP being well controlled, high of 130/90, normally 125/85, feeling well and has more energy. HR has been 70.  Pt states he will not start any new medication this time. Discussed cardiac benefit of starting SGLT2i given his HFrEF, he still does not want to start it. States when he takes too many medications he stops taking them all. He is agreeable to increasing his Toprol though, will increase to 50mg  daily. He'll continue on Entresto and will monitor BP and HR at home. I'll call pt again in 3 weeks for continued CHF med titration if pt is agreeable.

## 2021-01-18 NOTE — Telephone Encounter (Signed)
2nd message left for pt.

## 2021-01-24 ENCOUNTER — Telehealth: Payer: Self-pay

## 2021-01-24 NOTE — Telephone Encounter (Signed)
The patient has started taking Metoprolol and his blood pressure is up. He is going to the hospital and would like for Dr. Lovena Le nurse to give him a call.

## 2021-01-25 NOTE — Telephone Encounter (Signed)
Pt refused to make any med changes when I spoke with him last to optimize his CHF meds.  Called pt back. States he went to [place] to visit his daughter and had to go to the ER due to COPD issues, they have started him on steroids. He checked his BP once and it was 150/80, was running fine at home.  No med changes needed at this time, pt aware to resume checking BP when he returns home and I'll call him in another 2 weeks for an update.

## 2021-02-08 ENCOUNTER — Telehealth: Payer: Self-pay | Admitting: Pharmacist

## 2021-02-08 NOTE — Telephone Encounter (Signed)
Left message for pt to follow up with home BP readings. Pt previously unwilling to add new meds for CHF benefit (9/12 note) although I recommended adding SGLT2i, stated when he takes too many meds he stops taking them all. Will await pt's return call. If he still refuses new meds, will focus on titrating his Toprol and Entresto to goal doses as BP and HR allow.

## 2021-02-14 NOTE — Telephone Encounter (Signed)
Called pt again, no answer and mailbox full, unable to leave voicemail.

## 2021-02-20 NOTE — Telephone Encounter (Signed)
3rd call made to pt. He states he's in the process of moving, has missed some doses of his meds and hasn't been checking his BP regularly.  He will start checking his BP and HR regularly and I'll call him in another week for further HF med optimization.

## 2021-02-23 ENCOUNTER — Telehealth: Payer: Self-pay

## 2021-02-23 NOTE — Telephone Encounter (Signed)
Noted, will call pt on Monday as planned for follow up.

## 2021-02-23 NOTE — Telephone Encounter (Signed)
The patient is calling requesting that Jinny Blossom in Pharmacy give him a call back at [phone].

## 2021-02-23 NOTE — Telephone Encounter (Signed)
Pt requested to speak with this nurse in regards to concerns of elevated blood pressure.  Pt calling stating his BP is high-170's/100's.  States he has missed some medications this week because he was in the process of moving.  Just found his BP cuff and medications today.  Per Pt he checked his BP prior to taking his medications.  Advised Pt that we need to recheck his BP AFTER he has taken his AM medications.  He advises he will recheck now.  BP now is 158/105.  Pt states he has not been eating well this week while moving to another residence, has been eating increased salt.  He also advises he is very stressed at this time with moving and other stressors.  Encouraged Pt to decrease his salt intake and get back on his medications.  Advised if he was able, he should take a few days to relax and practice some self care to reduce stress.  Pt was tearful by end of call.  Will advise pharmacist of phone call.

## 2021-02-27 ENCOUNTER — Telehealth: Payer: Self-pay | Admitting: Pharmacist

## 2021-02-27 NOTE — Telephone Encounter (Signed)
Called pt to follow up with BP. Reports he's in [place] visiting his daughter. Has been smoking less since he's been out there, about 1/2 PPD instead of his normal 1 PPD. Had an elevated BP last Friday of 175/125 however hadn't been compliant with his meds. Reports since he started taking them again regularly, his BP has improved to 120/80 or so. Ate more sodium recently and feels fluid overloaded. Feeling mildly dizzy, likely due to previous noncompliance with meds and now compliance so his BP is improving. Advised pt to continue current meds for next 3 days to let his body readjust to his meds , then will increase Entresto to 49-51mg  BID which should also help more with fluid. States he has a 90 day supply of his current dose. He will double up and take 2 tablets BID. Scheduled f/u with me on 11/15 for BP check and lab work. Advised him to bring in home cuff and readings. Also advised him to continue working on cutting back on tobacco use and limit sodium in his diet.

## 2021-02-28 ENCOUNTER — Telehealth: Payer: Self-pay | Admitting: Internal Medicine

## 2021-02-28 NOTE — Telephone Encounter (Signed)
  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

## 2021-03-07 NOTE — Telephone Encounter (Signed)
Pt's daughter is NOT on DPR to release information or talk to.  Await further needs from Pt.

## 2021-03-14 ENCOUNTER — Telehealth: Payer: Self-pay | Admitting: Pharmacist

## 2021-03-14 ENCOUNTER — Other Ambulatory Visit: Payer: Self-pay

## 2021-03-14 ENCOUNTER — Ambulatory Visit (INDEPENDENT_AMBULATORY_CARE_PROVIDER_SITE_OTHER): Payer: PRIVATE HEALTH INSURANCE | Admitting: Pharmacist

## 2021-03-14 VITALS — BP 138/92 | HR 101 | Wt 199.0 lb

## 2021-03-14 DIAGNOSIS — I5022 Chronic systolic (congestive) heart failure: Secondary | ICD-10-CM

## 2021-03-14 LAB — BASIC METABOLIC PANEL
BUN/Creatinine Ratio: 15 (ref 10–24)
BUN: 14 mg/dL (ref 8–27)
CO2: 27 mmol/L (ref 20–29)
Calcium: 9.1 mg/dL (ref 8.6–10.2)
Chloride: 99 mmol/L (ref 96–106)
Creatinine, Ser: 0.94 mg/dL (ref 0.76–1.27)
Glucose: 80 mg/dL (ref 70–99)
Potassium: 4.7 mmol/L (ref 3.5–5.2)
Sodium: 137 mmol/L (ref 134–144)
eGFR: 92 mL/min/{1.73_m2} (ref >59)

## 2021-03-14 MED ORDER — APIXABAN 5 MG PO TABS: 5.0000 mg | ORAL_TABLET | Freq: Two times a day (BID) | ORAL | 1 refills | Status: DC

## 2021-03-14 MED ORDER — METOPROLOL SUCCINATE ER 50 MG PO TB24: 50.0000 mg | ORAL_TABLET | Freq: Every day | ORAL | 5 refills | Status: DC

## 2021-03-14 NOTE — Patient Instructions (Addendum)
Your blood pressure goal is < 130/44mmHg  Increase your metoprolol from 50mg  to 100mg  once daily  We'll check your labs today and if everything is stable, will increase the dose on your Entresto further  Please bring your home blood pressure cuff and readings to your next visit  Set an alarm on your phone to help with medication reminders  Work to cut back on cigarettes  Call Eliquis copay card company [phone] if the pharmacy doesn't have your copay card information. It should make a 1 month supply $10 or a 3 month supply $30  Follow up in 2 weeks for blood pressure check and lab work

## 2021-03-14 NOTE — Progress Notes (Addendum)
Patient ID: Mike Bean                 DOB: July 14, 1960                      MRN: [id number]     HPI: Mike Bean is a 60 y.o. male referred by Dr. Lovena Le to pharmacy clinic for HF medication management. PMH is significant for uncontrolled afib s/p AV node ablation and biv PPM insertion, CHF with LVEF 25-30% on 08/2020 echo, HLD, and tobacco/alcohol abuse. EF on 05/2012 TTE also reduced 35-40%. He was started on low dose Entresto at last visit with Dr Lovena Le on 11/22/20 and referred to PharmD for follow up management. Pt called in with reports of dizziness that he attributed to his Entresto, BP was elevated at 150/80. He was advised to stop his Lasix. I saw pt on 8/24 and started him on Toprol.   I called pt for follow up a few weeks later to check in on home BP readings and add SGLT2i, however pt refused to start any new medications because "when he takes too many medications he stops taking them all." He was agreeable to increasing his dose of Toprol to 50mg  daily. Pt has been hard to reach over the phone with many message left. He was in the process of moving and wasn't as adherent with his medications, was eating higher sodium foods, and was more stressed, home BP had increased to 170s/100s as a result. When he started taking meds again consistently, BP improved to 120s/80s but he felt a bit dizzy, likely due to quick change in his BP from increased med compliance. Was feeling fluid overloaded from increased sodium. He was advised to increase Entresto to 49-51mg  BID and presents today for follow up.  Today pt returns to pharmacy clinic for further medication titration with his daughter Mike Bean who's in town (lives in [place]). Reports tolerating medications well. Wears compression stockings for swelling, none noted today. Denies headaches. Dizziness improved with med compliance. Hasn't taken AM medications yet, usually does 10-11am. Reports home reading of 117/78. Still using lower 24-26mg  dose of  Entresto and doubling up. Smoking 1 PPD but states he only smokes 1/2 of each cigarette so it's closer to 1/2 PPD. Hasn't seen PCP in 10 years but daughter encouraged him to re-establish, has a visit upcoming in  December 8th. Has cut back drastically with alcohol use. Reports leg pain, asks about vascular workup. Diet has been better, lower in sodium now.   Daughter has concerns for dementia that she brings up after pt left the room. Reports he has urinated himself, will forget his words or that he's called her, has some occasional angry or emotional outbursts. Reports alcohol use is down to 1 beer now (previously would drink up to 20 beers in a day).  Current CHF meds: Entresto 49-51mg  BID, Toprol 50mg  daily  BP goal: <130/38mmHg  Family History: Father with MI at 21.  Social History: Smokes 1 PPD and drinks at least 3 beers a day.  Diet: Doesn't add salt to food but does like salty foods and has been working on trying to limit this  Exercise: None, wants to start bike riding 3-4 days a week  Wt Readings from Last 3 Encounters:  12/21/20 205 lb (93 kg)  11/22/20 204 lb 12.8 oz (92.9 kg)  08/18/20 208 lb 9.6 oz (94.6 kg)   BP Readings from Last 3 Encounters:  12/21/20 114/70  11/22/20 128/88  08/18/20 (!) 142/84   Pulse Readings from Last 3 Encounters:  12/21/20 79  11/22/20 88  08/18/20 87    Renal function: CrCl cannot be calculated (Patient's most recent lab result is older than the maximum 21 days allowed.).  Past Medical History:  Diagnosis Date   CHF (congestive heart failure) ([place])    History of alcohol abuse    Hyperlipidemia    Nonischemic cardiomyopathy ([place])    Presumed to be tachycardia mediated   Pacemaker 2012   Persistent atrial fibrillation ([place]) 2012   Snores     Current Outpatient Medications on File Prior to Visit  Medication Sig Dispense Refill   albuterol (PROVENTIL) (2.5 MG/3ML) 0.083% nebulizer solution Take 3 mLs (2.5 mg total) by nebulization  every 6 (six) hours as needed for wheezing or shortness of breath. 75 mL 12   albuterol (VENTOLIN HFA) 108 (90 Base) MCG/ACT inhaler Inhale 2 puffs into the lungs every 6 (six) hours as needed for wheezing or shortness of breath.     apixaban (ELIQUIS) 5 MG TABS tablet Take 1 tablet (5 mg total) by mouth 2 (two) times daily. 180 tablet 1   Coenzyme Q10 (CO Q 10) 100 MG CAPS Take 100 ng by mouth daily.      metoprolol succinate (TOPROL XL) 50 MG 24 hr tablet Take 1 tablet (50 mg total) by mouth daily. 30 tablet 5   Omega-3 Fatty Acids (FISH OIL CONCENTRATE) 300 MG CAPS Take 300 mg by mouth daily.      sacubitril-valsartan (ENTRESTO) 24-26 MG Take 1 tablet by mouth 2 (two) times daily. 60 tablet 11   No current facility-administered medications on file prior to visit.    No Known Allergies   Assessment/Plan:  1. CHF with EF 25-30% - BP a bit elevated above goal <130/46mmHg however pt hasn't taken AM meds yet.  Checking BMET today on dose increase of Entresto and if stable, will increase Entresto further to 97-103mg  BID, pt will need new rx sent in. Encouraged pt to start using an alarm to help with pill reminders. Will follow up in 2 weeks in clinic, pt advised to bring him home BP cuff and readings to that visit.  Pt also concerned about PAD, has pain in his legs especially when he walks. Still smoking at least 1/2 PPD as well which is a major risk factor. He will bring this up when he sees PCP on 12/8 for screening. Daughter also concerned that pt is showing signs of dementia. She will accompany pt to PCP visit and bring up concerns at that time as well.  Mike Bean, PharmD, BCACP, [place] [address], [place], [place] [address] Phone: [phone]; Fax: [phone] 03/14/2021 7:26 AM

## 2021-03-14 NOTE — Telephone Encounter (Signed)
BMET stable. Will increase Entresto to 97-103mg  BID, left message for pt. F/u appt already scheduled. He's currently doubling up and taking 2 of his 24-26mg  tabs BID, will need new rx sent in for highest dose.

## 2021-03-15 NOTE — Telephone Encounter (Signed)
Called pt, he is aware of lab results and to increase Entresto dose. Pharmacy filled a 90 day supply of his Entresto 24-26mg  BID a few weeks ago unfortunately despite [place] sending in rx for a 30 day supply. He has a lot of this dose at home and will plan to take 4 tabs twice daily. Will send in new rx for higher dose at follow up visit.

## 2021-03-20 ENCOUNTER — Emergency Department (HOSPITAL_COMMUNITY): Payer: PRIVATE HEALTH INSURANCE

## 2021-03-20 ENCOUNTER — Emergency Department (HOSPITAL_COMMUNITY)
Admission: EM | Admit: 2021-03-20 | Discharge: 2021-03-20 | Disposition: A | Discharge status: Home / Self Care | Payer: PRIVATE HEALTH INSURANCE | Attending: Emergency Medicine | Admitting: Emergency Medicine

## 2021-03-20 DIAGNOSIS — I509 Heart failure, unspecified: Secondary | ICD-10-CM | POA: Insufficient documentation

## 2021-03-20 DIAGNOSIS — Y9389 Activity, other specified: Secondary | ICD-10-CM | POA: Insufficient documentation

## 2021-03-20 DIAGNOSIS — Z7901 Long term (current) use of anticoagulants: Secondary | ICD-10-CM | POA: Diagnosis not present

## 2021-03-20 DIAGNOSIS — Z79899 Other long term (current) drug therapy: Secondary | ICD-10-CM | POA: Diagnosis not present

## 2021-03-20 DIAGNOSIS — R42 Dizziness and giddiness: Secondary | ICD-10-CM

## 2021-03-20 DIAGNOSIS — Z950 Presence of cardiac pacemaker: Secondary | ICD-10-CM | POA: Diagnosis not present

## 2021-03-20 DIAGNOSIS — J449 Chronic obstructive pulmonary disease, unspecified: Secondary | ICD-10-CM | POA: Diagnosis not present

## 2021-03-20 DIAGNOSIS — F17210 Nicotine dependence, cigarettes, uncomplicated: Secondary | ICD-10-CM | POA: Diagnosis not present

## 2021-03-20 DIAGNOSIS — S199XXA Unspecified injury of neck, initial encounter: Secondary | ICD-10-CM | POA: Insufficient documentation

## 2021-03-20 DIAGNOSIS — S0990XA Unspecified injury of head, initial encounter: Secondary | ICD-10-CM

## 2021-03-20 DIAGNOSIS — S069X9A Unspecified intracranial injury with loss of consciousness of unspecified duration, initial encounter: Secondary | ICD-10-CM | POA: Insufficient documentation

## 2021-03-20 DIAGNOSIS — H538 Other visual disturbances: Secondary | ICD-10-CM

## 2021-03-20 MED ORDER — MECLIZINE HCL 25 MG PO TABS
25.0000 mg | ORAL_TABLET | Freq: Once | ORAL | Status: AC
  Administered 2021-03-20: 25 mg via ORAL
  Filled 2021-03-20: qty 1

## 2021-03-20 MED ORDER — ACETAMINOPHEN 500 MG PO TABS
1000.0000 mg | ORAL_TABLET | Freq: Once | ORAL | Status: AC
  Administered 2021-03-20: 1000 mg via ORAL
  Filled 2021-03-20: qty 2
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
## 2021-03-20 NOTE — Discharge Instructions (Addendum)
You came to the emergency department today to be evaluated for your head injury, neck pain, dizziness, and blurry vision.  Your physical exam was reassuring.  The CT scan of your head showed no acute abnormalities.  The CT scan of your neck showed no acute abnormalities.  I have placed a ambulatory referral for neurology.  You should hear from the neurology group in the next 2-3 business days.  If you do not hear from them in the next 2-3 business days please use information on this paperwork to call and schedule a follow-up appointment.  Please follow-up with your primary care provider.  Get help right away if: You have: A severe headache that is not helped by medicine. Trouble walking or weakness in your arms and legs. Clear or bloody fluid coming from your nose or ears. Changes in your vision. A seizure. Increased confusion or irritability. Your symptoms get worse. You are sleepier than normal and have trouble staying awake. You lose your balance. Your pupils change size. Your speech is slurred. Your dizziness gets worse. You vomit.

## 2021-03-20 NOTE — ED Notes (Signed)
ED Provider at bedside. 

## 2021-03-20 NOTE — ED Notes (Signed)
Denies dizziness at present
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
## 2021-03-30 ENCOUNTER — Other Ambulatory Visit: Payer: Self-pay

## 2021-03-30 ENCOUNTER — Ambulatory Visit (INDEPENDENT_AMBULATORY_CARE_PROVIDER_SITE_OTHER): Payer: PRIVATE HEALTH INSURANCE | Admitting: Pharmacist

## 2021-03-30 VITALS — BP 114/80 | HR 70 | Wt 198.5 lb

## 2021-03-30 DIAGNOSIS — I5022 Chronic systolic (congestive) heart failure: Secondary | ICD-10-CM | POA: Diagnosis not present

## 2021-03-30 MED ORDER — DAPAGLIFLOZIN PROPANEDIOL 10 MG PO TABS: 10.0000 mg | ORAL_TABLET | Freq: Every day | ORAL | 5 refills | Status: DC

## 2021-03-30 MED ORDER — ENTRESTO 49-51 MG PO TABS: 1.0000 | ORAL_TABLET | Freq: Two times a day (BID) | ORAL | 5 refills | Status: DC

## 2021-03-30 NOTE — Patient Instructions (Signed)
Start taking Wilder Glade - 1 tablet once daily  Continue taking 2 Entresto 24-26mg  tablets twice daily until you use them up. Then pick up your refill for the higher dose (49-51mg  tablets) and go back to take 1 tablet twice daily  Continue taking your other medications  Keep an eye on your blood pressure at home - you can subtract 5 points or so from the top number. I'll call you in a few weeks to see how you are doing
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
## 2021-04-05 ENCOUNTER — Ambulatory Visit (INDEPENDENT_AMBULATORY_CARE_PROVIDER_SITE_OTHER): Payer: PRIVATE HEALTH INSURANCE

## 2021-04-05 DIAGNOSIS — I428 Other cardiomyopathies: Secondary | ICD-10-CM

## 2021-04-05 LAB — CUP PACEART REMOTE DEVICE CHECK
Battery Remaining Longevity: 82 mo
Battery Remaining Percentage: 89 %
Battery Voltage: 2.99 V
Date Time Interrogation Session: 20221207050556
Implantable Lead Implant Date: 20120228
Implantable Lead Implant Date: 20120228
Implantable Lead Implant Date: 20120228
Implantable Lead Location: 753858
Implantable Lead Location: 753859
Implantable Lead Location: 753860
Implantable Lead Model: 4196
Implantable Pulse Generator Implant Date: 20211011
Lead Channel Impedance Value: 450 Ohm
Lead Channel Impedance Value: 850 Ohm
Lead Channel Pacing Threshold Amplitude: 0.75 V
Lead Channel Pacing Threshold Amplitude: 1.25 V
Lead Channel Pacing Threshold Pulse Width: 0.5 ms
Lead Channel Pacing Threshold Pulse Width: 0.8 ms
Lead Channel Sensing Intrinsic Amplitude: 12 mV
Lead Channel Setting Pacing Amplitude: 2 V
Lead Channel Setting Pacing Amplitude: 2.5 V
Lead Channel Setting Pacing Pulse Width: 0.5 ms
Lead Channel Setting Pacing Pulse Width: 0.8 ms
Lead Channel Setting Sensing Sensitivity: 4 mV
Pulse Gen Model: 3222
Pulse Gen Serial Number: 3859402

## 2021-04-07 ENCOUNTER — Telehealth: Payer: Self-pay

## 2021-04-07 NOTE — Telephone Encounter (Signed)
Request received for handicap placard.  Placard completed and mailed to Pt.

## 2021-04-14 NOTE — Progress Notes (Signed)
Remote pacemaker transmission.   

## 2021-04-20 ENCOUNTER — Telehealth: Payer: Self-pay | Admitting: Pharmacist

## 2021-04-20 NOTE — Telephone Encounter (Signed)
Called pt to follow up with Farxiga tolerability. Spoke with his daughter, pt has been living with her part time and then working in [employer] part time, he was on his way [place]. She reports he's doing well, dizziness has actually improved. She checked his BP once a few weeks ago and thinks it was around 009 systolic. States he does not check at his home since she has the BP cuff. She will keep an eye on his BP readings over the next few weeks. Pt now establishing with PCP in early January. Will plan to follow up with pt and daughter after. Ideally still prefer to add spironolactone for his CHF if able.

## 2021-05-05 ENCOUNTER — Telehealth: Payer: Self-pay | Admitting: Psychiatry
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
## 2021-05-05 NOTE — Telephone Encounter (Signed)
FYI The son in law of pt Mike Bean not on a [place]) has been asked to call by his wife(daughter of pt, no DPR) daughter has asked that the appointment be treated as a general routine type appointment and that dementia not be mentioned .  The son in law was advised without a DPR there would not be any information given.  He stated he was not calling to obtain any information, only provide the above mentioned information.  There is no request for a call back.

## 2021-05-08 ENCOUNTER — Encounter: Payer: Self-pay | Admitting: Psychiatry

## 2021-05-08 ENCOUNTER — Ambulatory Visit: Payer: PRIVATE HEALTH INSURANCE | Admitting: Psychiatry

## 2021-05-08 VITALS — BP 110/69 | HR 69 | Ht 70.0 in | Wt 201.0 lb

## 2021-05-08 DIAGNOSIS — I639 Cerebral infarction, unspecified: Secondary | ICD-10-CM | POA: Diagnosis not present

## 2021-05-08 DIAGNOSIS — R413 Other amnesia: Secondary | ICD-10-CM | POA: Diagnosis not present

## 2021-05-08 DIAGNOSIS — F0781 Postconcussional syndrome: Secondary | ICD-10-CM | POA: Diagnosis not present

## 2021-05-08 NOTE — Patient Instructions (Signed)
MRI brain, MRA head/neck to look at blood vessels Blood work Let me know if you would like a referral for a sleep evaluation or for physical therapy of the neck

## 2021-05-08 NOTE — Telephone Encounter (Signed)
Pt's daughter(Mike Bean) has called expressing pt is a chronic alcholic but his symptoms are not relayted to that.  She believes pt has vascular dementia.  Daughter has asked that dementia not be mentioned but instead if pt is asked why he is being asked memory type questions that he be told its to check his memory retention.  Daughter states pt has told her that if dementia is mentioned he intends on leaving the appointment.  Daughter states she is not a doctor of any kind.  Daughter is asking that pt be told that the appointment has to do with vein and vascular which per daughter is not far from the truth since there are issues related to that which could be linked to vascular dementia.  This is all FYI daughter is aware because there is no DPR at this time nothing can be relayed to her until one is completed with her name on there.  Daughters # is [phone].  Phone rep did not inform daughter she would receive a call back.

## 2021-05-09 ENCOUNTER — Other Ambulatory Visit: Payer: Self-pay | Admitting: Psychiatry

## 2021-05-09 LAB — VITAMIN B12: Vitamin B-12: 366 pg/mL (ref 232–1245)

## 2021-05-09 LAB — LIPID PANEL
Chol/HDL Ratio: 4.2 ratio (ref 0.0–5.0)
Cholesterol, Total: 175 mg/dL (ref 100–199)
HDL: 42 mg/dL (ref >39)
LDL Chol Calc (NIH): 115 mg/dL — ABNORMAL HIGH (ref 0–99)
Triglycerides: 95 mg/dL (ref 0–149)
VLDL Cholesterol Cal: 18 mg/dL (ref 5–40)

## 2021-05-09 LAB — HEMOGLOBIN A1C
Est. average glucose Bld gHb Est-mCnc: 120 mg/dL
Hgb A1c MFr Bld: 5.8 % — ABNORMAL HIGH (ref 4.8–5.6)

## 2021-05-09 LAB — TSH: TSH: 1.83 u[IU]/mL (ref 0.450–4.500)

## 2021-05-09 MED ORDER — ATORVASTATIN CALCIUM 80 MG PO TABS: 80.0000 mg | ORAL_TABLET | Freq: Every day | ORAL | 2 refills | Status: DC

## 2021-05-12 ENCOUNTER — Telehealth: Payer: Self-pay | Admitting: Internal Medicine

## 2021-05-12 MED ORDER — ENTRESTO 49-51 MG PO TABS: 1.0000 | ORAL_TABLET | Freq: Two times a day (BID) | ORAL | 5 refills | Status: DC

## 2021-05-12 NOTE — Telephone Encounter (Signed)
PT'S DAUGHTER MADISON CALLED TO ADVISE THAT PT HAS RECENTLY BEEN DIAGNOSED WITH HAVING A STROKE AND VASCULAR DEMENTIA. MADISON WOULD LIKE TO COME INTO THE OFFICE AND SPEAK WITH DR. Lovena Le ABOUT PT'S CURRENT MEDS AND HIS PACEMAKER. MADISON IS NOT LISTED ON THE PT'S DPR, BUT HIS EX GIRLFRIEND IS. MADISON WILL BE TAKING OVER PT'S CARE.

## 2021-05-12 NOTE — Telephone Encounter (Signed)
Returned call to Pt.  Advised current DPR does not have his daughter listed.  We will need to update that at his next appt.  Pt due for follow up with Dr. Lovena Le.  Scheduled for May 29, 2021 at 3:15 pm.  Advised Pt to let his daughter know about appointment and to keep a list of questions to bring.  Pt requests Entresto be refilled at his current location.  Prescription sent.

## 2021-05-18 ENCOUNTER — Telehealth: Payer: Self-pay | Admitting: Psychiatry

## 2021-05-18 NOTE — Telephone Encounter (Signed)
Patient insurance informed me that no Josem Kaufmann is req for the MRA head and MRA neck only the MRI Brain.  MRI Brain auth: [id number] (exp. 05/18/21 to 06/17/21)  Since the patient has a pacemaker sent the order's to [facility] if it is safe they will reach out to the patient to schedule.

## 2021-05-29 ENCOUNTER — Encounter: Payer: PRIVATE HEALTH INSURANCE | Admitting: Internal Medicine

## 2021-05-29 DIAGNOSIS — I5022 Chronic systolic (congestive) heart failure: Secondary | ICD-10-CM

## 2021-05-29 DIAGNOSIS — I4891 Unspecified atrial fibrillation: Secondary | ICD-10-CM

## 2021-06-01 ENCOUNTER — Telehealth: Payer: Self-pay | Admitting: Pharmacist

## 2021-06-01 DIAGNOSIS — I5022 Chronic systolic (congestive) heart failure: Secondary | ICD-10-CM

## 2021-06-01 NOTE — Telephone Encounter (Signed)
Called pt to follow up with CHF med tolerability and home BP readings. No answer, VM full.  Pt currently taking Entresto 49-51mg  BID, Toprol 50mg  daily, and Farxiga 10mg  daily. Had other concerns going on and was establishing with PCP, not sure if he has had this visit yet. Was also due to see Dr Lovena Le earlier this week but missed his appt.  If able, still need to titrate Entresto and Toprol dose and add spironolactone for his CHF if BP allows.   Will try calling back later.

## 2021-06-07 NOTE — Telephone Encounter (Signed)
2nd call made to pt, no answer.

## 2021-06-14 MED ORDER — ENTRESTO 97-103 MG PO TABS: 1.0000 | ORAL_TABLET | Freq: Two times a day (BID) | ORAL | 5 refills | Status: DC

## 2021-06-14 NOTE — Telephone Encounter (Signed)
Called pt again .Reports feeling well, has some swelling in his legs, not watching his salt intake much. Home BP stable 120/80, 114/72. Will increase Entresto from 49-51mg  to 97-103mg  BID. He recently picked up a refill so he will double up and take 2 of his 49-51mg  tabs BID until he runs out. Sent in refill of higher dose and reviewed with pt that once he picks that up, to go back to 1 tab BID. Scheduled BMET on 2/28.

## 2021-06-14 NOTE — Addendum Note (Signed)
Addended by: Quinto Tippy E on: 06/14/2021 12:56 PM   Modules accepted: Orders

## 2021-06-26 ENCOUNTER — Telehealth: Payer: Self-pay | Admitting: Pharmacist

## 2021-06-26 DIAGNOSIS — I5022 Chronic systolic (congestive) heart failure: Secondary | ICD-10-CM

## 2021-06-26 NOTE — Telephone Encounter (Signed)
° °  Pt c/o medication issue:  1. Name of Medication:   sacubitril-valsartan (ENTRESTO) 97-103 MG    2. How are you currently taking this medication (dosage and times per day)? Take 1 tablet by mouth 2 (two) times daily.  3. Are you having a reaction (difficulty breathing--STAT)?   4. What is your medication issue? Pt said he is having side effects when taking this meds and would like to speak with [facility]

## 2021-06-26 NOTE — Telephone Encounter (Signed)
Entresto dose was increased from 49-51mg  BID to 97-103mg  BID on 06/01/21 with f/u BMET scheduled for tomorrow.  Called pt to discuss. Reports he never doubled up on old rx. Picked up new rx on the 24th so he's only  been taking the higher Entresto dose for the past few days. Has felt fine the past few days but noticed some dizziness and lightheadedness this morning. His BP at the time was 130/89, HR 89.  Discussed it may take a few days for his body to get used to the higher dose of Entresto but his BP was not low so dizziness may also be unrelated. He's willing to continue on higher dose of Entresto and will stay hydrated, take his time when changing positions, and monitor his BP at home. Moved out f/u BMET to 3/7. Will call pt after to discuss lab results and home BP readings.  He also reports seeing neurology recently and they mentioned he had a hx of remote stroke.

## 2021-06-27 ENCOUNTER — Other Ambulatory Visit: Payer: PRIVATE HEALTH INSURANCE

## 2021-07-04 ENCOUNTER — Other Ambulatory Visit: Payer: PRIVATE HEALTH INSURANCE

## 2021-07-05 ENCOUNTER — Ambulatory Visit (INDEPENDENT_AMBULATORY_CARE_PROVIDER_SITE_OTHER): Payer: PRIVATE HEALTH INSURANCE

## 2021-07-05 DIAGNOSIS — I4891 Unspecified atrial fibrillation: Secondary | ICD-10-CM | POA: Diagnosis not present

## 2021-07-05 LAB — CUP PACEART REMOTE DEVICE CHECK
Battery Remaining Longevity: 79 mo
Battery Remaining Percentage: 86 %
Battery Voltage: 2.99 V
Date Time Interrogation Session: 20230308020028
Implantable Lead Implant Date: 20120228
Implantable Lead Implant Date: 20120228
Implantable Lead Implant Date: 20120228
Implantable Lead Location: 753858
Implantable Lead Location: 753859
Implantable Lead Location: 753860
Implantable Lead Model: 4196
Implantable Pulse Generator Implant Date: 20211011
Lead Channel Impedance Value: 490 Ohm
Lead Channel Impedance Value: 810 Ohm
Lead Channel Pacing Threshold Amplitude: 0.625 V
Lead Channel Pacing Threshold Amplitude: 1.25 V
Lead Channel Pacing Threshold Pulse Width: 0.5 ms
Lead Channel Pacing Threshold Pulse Width: 0.8 ms
Lead Channel Sensing Intrinsic Amplitude: 12 mV
Lead Channel Setting Pacing Amplitude: 2 V
Lead Channel Setting Pacing Amplitude: 2.5 V
Lead Channel Setting Pacing Pulse Width: 0.5 ms
Lead Channel Setting Pacing Pulse Width: 0.8 ms
Lead Channel Setting Sensing Sensitivity: 4 mV
Pulse Gen Model: 3222
Pulse Gen Serial Number: 3859402

## 2021-07-05 NOTE — Telephone Encounter (Signed)
Pt missed lab appt for BMET yesterday. Called pt and left message to reschedule. ?

## 2021-07-06 ENCOUNTER — Other Ambulatory Visit: Payer: PRIVATE HEALTH INSURANCE

## 2021-07-06 ENCOUNTER — Other Ambulatory Visit: Payer: Self-pay

## 2021-07-06 DIAGNOSIS — I5022 Chronic systolic (congestive) heart failure: Secondary | ICD-10-CM

## 2021-07-06 NOTE — Telephone Encounter (Signed)
? ?  Pt is returning Megan's call, he said he wanted to speak with her before r/s  ?

## 2021-07-06 NOTE — Addendum Note (Signed)
Addended by: Cassius Cullinane E on: 07/06/2021 12:51 PM ? ? Modules accepted: Orders ? ?

## 2021-07-06 NOTE — Telephone Encounter (Signed)
Pt did not have any additional questions. Just needed lab rescheduled which I have done. ?

## 2021-07-08 LAB — BASIC METABOLIC PANEL
BUN/Creatinine Ratio: 14 (ref 10–24)
BUN: 17 mg/dL (ref 8–27)
CO2: 24 mmol/L (ref 20–29)
Calcium: 9.1 mg/dL (ref 8.6–10.2)
Chloride: 99 mmol/L (ref 96–106)
Creatinine, Ser: 1.21 mg/dL (ref 0.76–1.27)
Glucose: 65 mg/dL — ABNORMAL LOW (ref 70–99)
Potassium: 4.4 mmol/L (ref 3.5–5.2)
Sodium: 138 mmol/L (ref 134–144)
eGFR: 67 mL/min/{1.73_m2} (ref >59)

## 2021-07-10 NOTE — Telephone Encounter (Signed)
BMET stable, SCr similar to 6 months ago. Called pt to discuss. Reports home BP 031Y systolic, occasional dizziness but BP normal. Sees neuro next month. Will continue current CHF meds Toprol '50mg'$  daily, Farxiga '10mg'$  daily, and Entresto 97-'103mg'$  BID. Ideally will need spironolactone added in the future but given occasional dizziness will hold off for now. Will touch base with pt once he has his neuro follow up. ?

## 2021-07-18 NOTE — Progress Notes (Signed)
Remote pacemaker transmission.   

## 2021-07-25 ENCOUNTER — Telehealth: Payer: Self-pay | Admitting: Cardiology

## 2021-07-25 NOTE — Telephone Encounter (Signed)
Pt's daughter called for pt with hx of CM and EF 25-35% and BiV ICD, now with SOB with lower BP 104/57 and P up to 93.   He is on eliquis as well. Hx of AV node ablation and PPM with bivIcd.  Pt is in [place].  Since this is new change for pt I have his daughter to take to an urgent care or ER near them for eval.   She is agreeable.   ?

## 2021-08-07 ENCOUNTER — Telehealth: Payer: Self-pay | Admitting: Psychiatry

## 2021-08-07 NOTE — Telephone Encounter (Signed)
Daughter has called back to report she will try and do a face time with pt. Later today, this is FYI ?

## 2021-08-07 NOTE — Telephone Encounter (Signed)
PHONE RM CAN RELAY  ? ?Contacted daughter back, VM unavailable.  ? ?Daughter can call father and be on phone during the visit. We have no way to VV her, she can contact father and be on call, or if he has face-time.  ?

## 2021-08-07 NOTE — Telephone Encounter (Signed)
Pt daughter is wanting to know if she would be able to be on video call during her fathers appt Wednesday, 08/09/21. She would like a call back.  ?

## 2021-08-07 NOTE — Telephone Encounter (Signed)
Noted  

## 2021-08-08 NOTE — Progress Notes (Signed)
? ?  CC:  post concussive syndrome, memory loss ? ?Follow-up Visit ? ?Last visit: 05/08/21 ? ?Brief HPI: ?61 year old male with a history of left parietal CVA, chronic alcoholism, nonischemic cardiomyopathy, afib on Eliquis, s/p pacemaker, COPD who follows in clinic for post concussive syndrome and memory loss. ? ?At his last visit, MRI/MRA brain were ordered. ? ?Interval History: ?He continues to have intermittent vertigo and nausea once every 1-2 weeks when he turns his head too quickly. This is associated with occipital headaches and a feeling that he needs to lie down. Also has light sensitivity and tinnitus. Symptoms can last for 1-2 days. ? ?MRI/MRA has not yet been done. ? ?He personally does not notice any trouble with his memory. His daughter continues to be concerned. She feels he is more confused and irritable. She thinks his speech has been slurred occasionally. He wet himself once overnight. He continue to struggle with alcohol abuse. ? ?LDL was 115 and he was prescribed lipitor 80 mg daily. Has not started this yet. ? ?Physical Exam:  ? ?Vital Signs: ?BP 111/69   Pulse 69   Ht '5\' 10"'$  (1.778 m)   Wt 192 lb (87.1 kg)   BMI 27.55 kg/m?  ?GENERAL:  well appearing, in no acute distress, alert  ?SKIN:  Color, texture, turgor normal. No rashes or lesions ?HEAD:  Normocephalic/atraumatic. ?RESP: normal respiratory effort ?MSK:  No gross joint deformities.  ? ?NEUROLOGICAL: ? ?  08/09/2021  ?  8:57 AM  ?Montreal Cognitive Assessment   ?Visuospatial/ Executive (0/5) 3  ?Naming (0/3) 3  ?Attention: Read list of digits (0/2) 2  ?Attention: Read list of letters (0/1) 1  ?Attention: Serial 7 subtraction starting at 100 (0/3) 3  ?Language: Repeat phrase (0/2) 2  ?Language : Fluency (0/1) 1  ?Abstraction (0/2) 2  ?Delayed Recall (0/5) 5  ?Orientation (0/6) 6  ?Total 28  ? ? ?Mental Status: Alert, oriented to person, place and time, Follows commands, and Speech fluent and appropriate. ?Cranial Nerves: PERRL, face  symmetric, no dysarthria, hearing grossly intact ?Motor: moves all extremities equally ?Gait: normal-based. ? ?No vertigo or nystagmus induced on Dix-Hallpike, he did develop vertigo immediately after the testing ? ?IMPRESSION: ?61 year old male with a history of  left parietal CVA, chronic alcoholism, nonischemic cardiomyopathy, afib on Eliquis, s/p pacemaker, COPD who presents for follow up of memory loss and vertigo. MOCA today is 28/30, which is within normal limits. Suspect his alcohol abuse is contributing to his memory and personality changes. His daughter remains very concerned about an early dementia process as she continues to notice increased confusion and behavioral changes. Will refer for neuropsychological testing to better characterize his deficits and assess for early signs of neurodegenerative process. No nystagmus or vertigo elicited on Dix-Hallpike, though he did develop vertigo immediately after the test. Will refer for vestibular therapy to see if they can help with his vertigo symptoms. May consider treating for migraine if he does not improve with vestibular therapy. ? ?PLAN: ?-MRI/MRA brain ?-Start lipitor 80 mg daily ?-Referral to neuropsychology ?-Referral to vestibular therapy ? ?Follow-up: 6 months ? ?I spent a total of 43 minutes on the date of the service. Discussed medication side effects, adverse reactions and drug interactions. Written educational materials and patient instructions outlining all of the above were given. ? ?Genia Harold, MD ?08/09/21 ?10:13 AM ? ?

## 2021-08-09 ENCOUNTER — Ambulatory Visit: Payer: PRIVATE HEALTH INSURANCE | Admitting: Psychiatry

## 2021-08-09 ENCOUNTER — Encounter: Payer: Self-pay | Admitting: Psychiatry

## 2021-08-09 ENCOUNTER — Telehealth: Payer: Self-pay | Admitting: Psychiatry

## 2021-08-09 VITALS — BP 111/69 | HR 69 | Ht 70.0 in | Wt 192.0 lb

## 2021-08-09 DIAGNOSIS — R413 Other amnesia: Secondary | ICD-10-CM | POA: Diagnosis not present

## 2021-08-09 DIAGNOSIS — I639 Cerebral infarction, unspecified: Secondary | ICD-10-CM | POA: Diagnosis not present

## 2021-08-09 DIAGNOSIS — R42 Dizziness and giddiness: Secondary | ICD-10-CM | POA: Diagnosis not present

## 2021-08-09 NOTE — Telephone Encounter (Signed)
Referral for Neuropsychology sent to [facility] [phone]. 

## 2021-08-09 NOTE — Patient Instructions (Addendum)
Start lipitor (atorvastatin 80 mg daily) for cholesterol ?Vestibular therapy ?MRI/MRA ?Neuropsychological testing ?

## 2021-08-13 ENCOUNTER — Telehealth: Payer: Self-pay | Admitting: Student

## 2021-08-13 MED ORDER — METOPROLOL SUCCINATE ER 50 MG PO TB24: 50.0000 mg | ORAL_TABLET | Freq: Every day | ORAL | 5 refills | Status: DC

## 2021-08-13 NOTE — Telephone Encounter (Signed)
? ?  Patient's daughter called requesting refill of Toprol-XL. He is on Toprol-XL '50mg'$  daily. She states he has been out for 10 days. Will send refill to requested pharmacy. ? ?Darreld Mclean, PA-C ?08/13/2021 3:44 PM ? ? ? ?

## 2021-08-15 ENCOUNTER — Telehealth: Payer: Self-pay | Admitting: Psychiatry

## 2021-08-15 NOTE — Telephone Encounter (Signed)
MRI pending faxed notes pending ref # [id number] ? ?MRA head and neck no auth req.  ?

## 2021-08-16 NOTE — Telephone Encounter (Signed)
Auth: [id number] (exp. 08/16/21 to 09/15/21) ? ?Faxed order to [facility] ph # [phone]. ?

## 2021-08-16 NOTE — Telephone Encounter (Signed)
[facility] ph # [phone] option 2 and fax # [phone] ?

## 2021-08-21 NOTE — Telephone Encounter (Signed)
[facility] has made multiple attempts to contact the patient by phone/voicemail. Patient has not returned or answered their calls.  ?

## 2021-08-31 ENCOUNTER — Telehealth: Payer: Self-pay | Admitting: Psychiatry

## 2021-08-31 NOTE — Telephone Encounter (Signed)
Whitney from the MRI dept of [facility] called to report that pt was referred to them because of his Pacemaker.  Whitney states pt has a non conditional device, she wants to know if this needs to be cancelled due to this being an elevated risk. [phone] is the phone line for Device Coordinators, please call. ?

## 2021-09-04 ENCOUNTER — Other Ambulatory Visit: Payer: Self-pay | Admitting: Psychiatry

## 2021-09-04 ENCOUNTER — Telehealth: Payer: Self-pay | Admitting: Psychiatry

## 2021-09-04 DIAGNOSIS — I639 Cerebral infarction, unspecified: Secondary | ICD-10-CM

## 2021-09-04 NOTE — Telephone Encounter (Signed)
Called Mike Bean at [facility], LVM informing her Dr Billey Gosling will not have MRI done but has ordered CT head/neck instead. Left # for questions. ?

## 2021-09-04 NOTE — Telephone Encounter (Signed)
[place] Josem Kaufmann req spoke to [facility] [place] ref # [id number] order faxed to [facility] . ?

## 2021-09-04 NOTE — Telephone Encounter (Signed)
We can hold off on MRI. I ordered a CTA head/neck which he can have done instead of the MRA

## 2021-09-18 ENCOUNTER — Telehealth: Payer: Self-pay

## 2021-09-18 ENCOUNTER — Encounter: Payer: Self-pay | Admitting: *Deleted

## 2021-09-18 NOTE — Telephone Encounter (Signed)
Pt called asking phone rep to see if Marcille Blanco, CMA were available for his call back.  Phone rep informed pt that likely [facility], CMA was with a pt.  Pt then stated maybe he should look for a new neurologist, someone who can process his request in a timely manner.  Pt stated the request was not complicated, he simply needs a MRI scheduled not a CT since he recently had one. Pt was told message would be sent

## 2021-09-18 NOTE — Telephone Encounter (Signed)
The patient states he was recommended by dr. Lovena Le to see dr. Billey Gosling at [facility] but got no answer every time he called. He wants Lovena Le nurse to give him a call back.

## 2021-09-18 NOTE — Telephone Encounter (Signed)
Received message from Phone staff that patient was being passive aggressive. Contacted pt back, in the mist of me introducing myself and stating I was reaching out earlier to go over Dr Georgina Peer recommendations with him, he cut me off and stated "well I cant sit by the phone all day waiting on you to return my call" I attempted to apologize to him for the delay and that I am unable to return called immediately as I and the Dr see's pts as well. (His message was sent to Dr Billey Gosling in a hr, during lunch period). As I began to explain to him Dr Georgina Peer recommendations, he cuts me off telling me what he does and does not need. I proceed to try to explain to him the reason as to why he is unable to receive the MRI, he stops me again to say "we and [facility] has already told him this, I need to do the research to find someone who will do his MRI, If I'm to incompetent to do it find someone else who can or he will go to another neurologist outside of [place] and [facility] and he continues to be rude and insulting. I attempted to ask the patient to give me one second to explain to him that his pacemaker is unsafe for the MRI and Dr Billey Gosling can use the CTA to get the information she needs as well, he continue to be rude I asked him to let me speak or we will discuss this later when he is open to hearing me, he was not so call ended. I Spoke to Dr Billey Gosling regarding the situation and she agreed that he can only do CTA or he can find someone who is able to do MRI on his own.

## 2021-09-18 NOTE — Telephone Encounter (Signed)
Contacted pt, LVM rq CB 

## 2021-09-18 NOTE — Telephone Encounter (Signed)
Pt called stating that [facility] scheduled him for a CT scan rather than MRI. Pt is asking for [place] to contact [facility] and clear this up, he states he was told he needed the MRI done instead as he just had a CT scan. Pt's best call back # is [phone].

## 2021-09-18 NOTE — Telephone Encounter (Signed)
[facility]'s MRI department told me that his device was non-conditional, which increases his risk of complications to get an MRI. I'd prefer to avoid MRI due to this increased risk. He does not need another CTH but does need a CTA to look at his blood vessels

## 2021-09-18 NOTE — Telephone Encounter (Signed)
Appears referral was placed and Dr Georgina Peer office had attempted to contact pt multiple times.  Will have Sonia Baller follow up with next steps.

## 2021-09-18 NOTE — Telephone Encounter (Signed)
Your notes state for pt to have MRI and MRA but not CT but you did place order for that as well, does he need repeat CT?

## 2021-09-20 NOTE — Telephone Encounter (Signed)
Returned call to Pt.  He wants to know if it is ok to take Zyrtec daily.  Advised it is.  He also had questions about the CTA requested by Dr. Billey Gosling.  Advised Pt that if Dr. Billey Gosling thought that test would be sufficient, that he should go ahead with it.  Advised unsure where he could get an MRI (device/leads not from same manufacturer).  Pt in agreement to proceed with CTA.  He will contact Dr. Georgina Peer office.  No further questions at this time.

## 2021-10-04 ENCOUNTER — Ambulatory Visit (INDEPENDENT_AMBULATORY_CARE_PROVIDER_SITE_OTHER): Payer: PRIVATE HEALTH INSURANCE

## 2021-10-04 DIAGNOSIS — I428 Other cardiomyopathies: Secondary | ICD-10-CM

## 2021-10-05 LAB — CUP PACEART REMOTE DEVICE CHECK
Battery Remaining Longevity: 75 mo
Battery Remaining Percentage: 83 %
Battery Voltage: 2.99 V
Date Time Interrogation Session: 20230607020021
Implantable Lead Implant Date: 20120228
Implantable Lead Implant Date: 20120228
Implantable Lead Implant Date: 20120228
Implantable Lead Location: 753858
Implantable Lead Location: 753859
Implantable Lead Location: 753860
Implantable Lead Model: 4196
Implantable Pulse Generator Implant Date: 20211011
Lead Channel Impedance Value: 460 Ohm
Lead Channel Impedance Value: 780 Ohm
Lead Channel Pacing Threshold Amplitude: 0.75 V
Lead Channel Pacing Threshold Amplitude: 1.25 V
Lead Channel Pacing Threshold Pulse Width: 0.5 ms
Lead Channel Pacing Threshold Pulse Width: 0.8 ms
Lead Channel Sensing Intrinsic Amplitude: 12 mV
Lead Channel Setting Pacing Amplitude: 2 V
Lead Channel Setting Pacing Amplitude: 2.5 V
Lead Channel Setting Pacing Pulse Width: 0.5 ms
Lead Channel Setting Pacing Pulse Width: 0.8 ms
Lead Channel Setting Sensing Sensitivity: 4 mV
Pulse Gen Model: 3222
Pulse Gen Serial Number: 3859402

## 2021-10-11 ENCOUNTER — Telehealth: Payer: Self-pay | Admitting: Internal Medicine

## 2021-10-11 NOTE — Telephone Encounter (Signed)
Pt is scheduled with RU to discuss recent hospitalization

## 2021-10-11 NOTE — Telephone Encounter (Signed)
Pt is requesting call back from Willeen Cass, RN in regards to him having to go to hospital on 06/10 while in [place]. He states that he may have had a stroke and would like someone to go over information from that hospital visit and give him a call to see if he needs to schedule appt. Please advise.

## 2021-10-13 ENCOUNTER — Ambulatory Visit (INDEPENDENT_AMBULATORY_CARE_PROVIDER_SITE_OTHER): Payer: PRIVATE HEALTH INSURANCE | Admitting: Physician Assistant

## 2021-10-13 ENCOUNTER — Encounter: Payer: Self-pay | Admitting: Physician Assistant

## 2021-10-13 VITALS — BP 110/68 | HR 70 | Ht 70.0 in | Wt 180.2 lb

## 2021-10-13 DIAGNOSIS — I739 Peripheral vascular disease, unspecified: Secondary | ICD-10-CM

## 2021-10-13 DIAGNOSIS — I4821 Permanent atrial fibrillation: Secondary | ICD-10-CM

## 2021-10-13 DIAGNOSIS — Z950 Presence of cardiac pacemaker: Secondary | ICD-10-CM | POA: Diagnosis not present

## 2021-10-13 DIAGNOSIS — I428 Other cardiomyopathies: Secondary | ICD-10-CM | POA: Diagnosis not present

## 2021-10-13 DIAGNOSIS — I5022 Chronic systolic (congestive) heart failure: Secondary | ICD-10-CM | POA: Diagnosis not present

## 2021-10-13 LAB — CUP PACEART INCLINIC DEVICE CHECK
Date Time Interrogation Session: 20230616173416
Implantable Lead Implant Date: 20120228
Implantable Lead Implant Date: 20120228
Implantable Lead Implant Date: 20120228
Implantable Lead Location: 753858
Implantable Lead Location: 753859
Implantable Lead Location: 753860
Implantable Lead Model: 4196
Implantable Pulse Generator Implant Date: 20211011
Lead Channel Pacing Threshold Amplitude: 0.75 V
Lead Channel Pacing Threshold Amplitude: 1.25 V
Lead Channel Pacing Threshold Pulse Width: 0.5 ms
Lead Channel Pacing Threshold Pulse Width: 0.8 ms
Lead Channel Sensing Intrinsic Amplitude: 12 mV
Pulse Gen Model: 3222
Pulse Gen Serial Number: 3859402

## 2021-10-13 MED ORDER — FUROSEMIDE 40 MG PO TABS: 40.0000 mg | ORAL_TABLET | Freq: Every day | ORAL | 0 refills | Status: DC | PRN

## 2021-10-13 NOTE — Patient Instructions (Addendum)
Medication Instructions:    START TAKING : FUROSEMIDE (LASIX ) 40 MG ONCE A DAY AS NEEDED    *If you need a refill on your cardiac medications before your next appointment, please call your pharmacy*    Lab Work: NONE ORDERED  TODAY    If you have labs (blood work) drawn today and your tests are completely normal, you will receive your results only by: [place] (if you have MyChart) OR A paper copy in the mail If you have any lab test that is abnormal or we need to change your treatment, we will call you to review the results.   Testing/Procedures: Your physician has requested that you have an echocardiogram. Echocardiography is a painless test that uses sound waves to create images of your heart. It provides your doctor with information about the size and shape of your heart and how well your heart's chambers and valves are working. This procedure takes approximately one hour. There are no restrictions for this procedure.  Your physician has requested that you have an ankle brachial index (ABI). During this test an ultrasound and blood pressure cuff are used to evaluate the arteries that supply the arms and legs with blood. Allow thirty minutes for this exam. There are no restrictions or special instructions.    Follow-Up: At [facility], you and your health needs are our priority.  As part of our continuing mission to provide you with exceptional heart care, we have created designated Provider Care Teams.  These Care Teams include your primary Cardiologist (physician) and Advanced Practice Providers (APPs -  Physician Assistants and Nurse Practitioners) who all work together to provide you with the care you need, when you need it.  We recommend signing up for the patient portal called "MyChart".  Sign up information is provided on this After Visit Summary.  MyChart is used to connect with patients for Virtual Visits (Telemedicine).  Patients are able to view lab/test results,  encounter notes, upcoming appointments, etc.  Non-urgent messages can be sent to your provider as well.   To learn more about what you can do with MyChart, go to [email or web address].    Your next appointment:   6 week(s)  The format for your next appointment:   In Person  Provider:   You may see Dr. Lovena Le one of the following Advanced Practice Providers on your designated Care Team:   [name], [name] "[name]" Chalmers Cater, [place]    Other Instructions   Important Information About [place]

## 2021-10-13 NOTE — Progress Notes (Signed)
Remote pacemaker transmission.   
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
## 2021-10-16 ENCOUNTER — Other Ambulatory Visit: Payer: Self-pay | Admitting: Physician Assistant

## 2021-10-16 DIAGNOSIS — I739 Peripheral vascular disease, unspecified: Secondary | ICD-10-CM

## 2021-10-24 ENCOUNTER — Telehealth: Payer: Self-pay

## 2021-10-24 NOTE — Telephone Encounter (Signed)
Spoke with pt and gave him the message

## 2021-10-27 ENCOUNTER — Ambulatory Visit (HOSPITAL_COMMUNITY)
Admission: RE | Admit: 2021-10-27 | Discharge: 2021-10-27 | Disposition: A | Discharge status: Home / Self Care | Payer: PRIVATE HEALTH INSURANCE | Source: Ambulatory Visit | Attending: Cardiovascular Disease | Admitting: Cardiovascular Disease

## 2021-10-27 DIAGNOSIS — I739 Peripheral vascular disease, unspecified: Secondary | ICD-10-CM | POA: Diagnosis present

## 2021-11-02 ENCOUNTER — Ambulatory Visit (HOSPITAL_COMMUNITY): Payer: PRIVATE HEALTH INSURANCE | Attending: Cardiology

## 2021-11-06 ENCOUNTER — Encounter (HOSPITAL_COMMUNITY): Payer: Self-pay | Admitting: Physician Assistant

## 2021-11-09 ENCOUNTER — Other Ambulatory Visit: Payer: Self-pay

## 2021-11-09 MED ORDER — DAPAGLIFLOZIN PROPANEDIOL 10 MG PO TABS: 10.0000 mg | ORAL_TABLET | Freq: Every day | ORAL | 3 refills | Status: DC

## 2021-11-24 ENCOUNTER — Encounter: Payer: PRIVATE HEALTH INSURANCE | Admitting: Physician Assistant
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
## 2021-12-06 ENCOUNTER — Encounter: Payer: Self-pay | Admitting: Gastroenterology

## 2022-01-03 ENCOUNTER — Other Ambulatory Visit: Payer: Self-pay | Admitting: Internal Medicine

## 2022-01-03 ENCOUNTER — Ambulatory Visit (INDEPENDENT_AMBULATORY_CARE_PROVIDER_SITE_OTHER): Payer: PRIVATE HEALTH INSURANCE

## 2022-01-03 DIAGNOSIS — I428 Other cardiomyopathies: Secondary | ICD-10-CM

## 2022-01-03 LAB — CUP PACEART REMOTE DEVICE CHECK
Battery Remaining Longevity: 73 mo
Battery Remaining Percentage: 80 %
Battery Voltage: 2.99 V
Date Time Interrogation Session: 20230906041033
Implantable Lead Implant Date: 20120228
Implantable Lead Implant Date: 20120228
Implantable Lead Implant Date: 20120228
Implantable Lead Location: 753858
Implantable Lead Location: 753859
Implantable Lead Location: 753860
Implantable Lead Model: 4196
Implantable Pulse Generator Implant Date: 20211011
Lead Channel Impedance Value: 460 Ohm
Lead Channel Impedance Value: 840 Ohm
Lead Channel Pacing Threshold Amplitude: 0.75 V
Lead Channel Pacing Threshold Amplitude: 1.25 V
Lead Channel Pacing Threshold Pulse Width: 0.5 ms
Lead Channel Pacing Threshold Pulse Width: 0.8 ms
Lead Channel Sensing Intrinsic Amplitude: 12 mV
Lead Channel Setting Pacing Amplitude: 2 V
Lead Channel Setting Pacing Amplitude: 2.5 V
Lead Channel Setting Pacing Pulse Width: 0.5 ms
Lead Channel Setting Pacing Pulse Width: 0.8 ms
Lead Channel Setting Sensing Sensitivity: 4 mV
Pulse Gen Model: 3222
Pulse Gen Serial Number: 3859402

## 2022-01-04 NOTE — Telephone Encounter (Signed)
Prescription refill request for Eliquis received. Indication:Afib Last office visit:6/23 Scr:1.0 Age: 61 Weight:81.7 kg  Prescription refilled

## 2022-01-10 ENCOUNTER — Telehealth: Payer: Self-pay

## 2022-01-10 ENCOUNTER — Inpatient Hospital Stay (HOSPITAL_COMMUNITY): Payer: PRIVATE HEALTH INSURANCE

## 2022-01-10 ENCOUNTER — Other Ambulatory Visit: Payer: Self-pay

## 2022-01-10 ENCOUNTER — Inpatient Hospital Stay (HOSPITAL_COMMUNITY)
Admission: EM | Admit: 2022-01-10 | Discharge: 2022-01-12 | DRG: 065 | Disposition: A | Discharge status: Home / Self Care | Payer: PRIVATE HEALTH INSURANCE | Attending: Internal Medicine | Admitting: Internal Medicine

## 2022-01-10 ENCOUNTER — Emergency Department (HOSPITAL_COMMUNITY): Payer: PRIVATE HEALTH INSURANCE

## 2022-01-10 ENCOUNTER — Encounter (HOSPITAL_COMMUNITY): Payer: Self-pay

## 2022-01-10 DIAGNOSIS — E785 Hyperlipidemia, unspecified: Secondary | ICD-10-CM | POA: Diagnosis present

## 2022-01-10 DIAGNOSIS — Z7984 Long term (current) use of oral hypoglycemic drugs: Secondary | ICD-10-CM | POA: Diagnosis not present

## 2022-01-10 DIAGNOSIS — R531 Weakness: Secondary | ICD-10-CM

## 2022-01-10 DIAGNOSIS — I428 Other cardiomyopathies: Secondary | ICD-10-CM | POA: Diagnosis present

## 2022-01-10 DIAGNOSIS — Z7901 Long term (current) use of anticoagulants: Secondary | ICD-10-CM | POA: Diagnosis not present

## 2022-01-10 DIAGNOSIS — I4821 Permanent atrial fibrillation: Secondary | ICD-10-CM | POA: Diagnosis present

## 2022-01-10 DIAGNOSIS — Z79899 Other long term (current) drug therapy: Secondary | ICD-10-CM

## 2022-01-10 DIAGNOSIS — Z20822 Contact with and (suspected) exposure to covid-19: Secondary | ICD-10-CM | POA: Diagnosis present

## 2022-01-10 DIAGNOSIS — Z9049 Acquired absence of other specified parts of digestive tract: Secondary | ICD-10-CM

## 2022-01-10 DIAGNOSIS — R29700 NIHSS score 0: Secondary | ICD-10-CM | POA: Diagnosis not present

## 2022-01-10 DIAGNOSIS — I639 Cerebral infarction, unspecified: Secondary | ICD-10-CM | POA: Diagnosis not present

## 2022-01-10 DIAGNOSIS — R45850 Homicidal ideations: Secondary | ICD-10-CM | POA: Diagnosis present

## 2022-01-10 DIAGNOSIS — F17210 Nicotine dependence, cigarettes, uncomplicated: Secondary | ICD-10-CM | POA: Diagnosis present

## 2022-01-10 DIAGNOSIS — Z950 Presence of cardiac pacemaker: Secondary | ICD-10-CM | POA: Diagnosis not present

## 2022-01-10 DIAGNOSIS — I5022 Chronic systolic (congestive) heart failure: Secondary | ICD-10-CM | POA: Diagnosis present

## 2022-01-10 DIAGNOSIS — F1490 Cocaine use, unspecified, uncomplicated: Secondary | ICD-10-CM | POA: Diagnosis present

## 2022-01-10 DIAGNOSIS — Z791 Long term (current) use of non-steroidal anti-inflammatories (NSAID): Secondary | ICD-10-CM | POA: Diagnosis not present

## 2022-01-10 DIAGNOSIS — Z8249 Family history of ischemic heart disease and other diseases of the circulatory system: Secondary | ICD-10-CM | POA: Diagnosis not present

## 2022-01-10 DIAGNOSIS — R29702 NIHSS score 2: Secondary | ICD-10-CM | POA: Diagnosis present

## 2022-01-10 DIAGNOSIS — I6389 Other cerebral infarction: Secondary | ICD-10-CM | POA: Diagnosis not present

## 2022-01-10 DIAGNOSIS — R45851 Suicidal ideations: Secondary | ICD-10-CM | POA: Diagnosis present

## 2022-01-10 DIAGNOSIS — I509 Heart failure, unspecified: Secondary | ICD-10-CM

## 2022-01-10 DIAGNOSIS — R471 Dysarthria and anarthria: Secondary | ICD-10-CM | POA: Diagnosis not present

## 2022-01-10 DIAGNOSIS — R29703 NIHSS score 3: Secondary | ICD-10-CM | POA: Diagnosis not present

## 2022-01-10 DIAGNOSIS — I110 Hypertensive heart disease with heart failure: Secondary | ICD-10-CM | POA: Diagnosis present

## 2022-01-10 DIAGNOSIS — I4891 Unspecified atrial fibrillation: Secondary | ICD-10-CM | POA: Diagnosis present

## 2022-01-10 DIAGNOSIS — I6381 Other cerebral infarction due to occlusion or stenosis of small artery: Secondary | ICD-10-CM | POA: Diagnosis present

## 2022-01-10 DIAGNOSIS — R200 Anesthesia of skin: Secondary | ICD-10-CM

## 2022-01-10 LAB — RAPID URINE DRUG SCREEN, HOSP PERFORMED
Amphetamines: NOT DETECTED
Barbiturates: NOT DETECTED
Benzodiazepines: NOT DETECTED
Cocaine: POSITIVE — AB
Opiates: NOT DETECTED
Tetrahydrocannabinol: POSITIVE — AB

## 2022-01-10 LAB — DIFFERENTIAL
Abs Immature Granulocytes: 0.05 10*3/uL (ref 0.00–0.07)
Basophils Absolute: 0.1 10*3/uL (ref 0.0–0.1)
Basophils Relative: 1 %
Eosinophils Absolute: 0.1 10*3/uL (ref 0.0–0.5)
Eosinophils Relative: 2 %
Immature Granulocytes: 1 %
Lymphocytes Relative: 16 %
Lymphs Abs: 1 10*3/uL (ref 0.7–4.0)
Monocytes Absolute: 0.8 10*3/uL (ref 0.1–1.0)
Monocytes Relative: 13 %
Neutro Abs: 4.2 10*3/uL (ref 1.7–7.7)
Neutrophils Relative %: 67 %

## 2022-01-10 LAB — URINALYSIS, ROUTINE W REFLEX MICROSCOPIC
Bilirubin Urine: NEGATIVE
Glucose, UA: 150 mg/dL — AB
Hgb urine dipstick: NEGATIVE
Ketones, ur: NEGATIVE mg/dL
Leukocytes,Ua: NEGATIVE
Nitrite: NEGATIVE
Protein, ur: NEGATIVE mg/dL
Specific Gravity, Urine: 1.004 — ABNORMAL LOW (ref 1.005–1.030)
pH: 5 (ref 5.0–8.0)

## 2022-01-10 LAB — COMPREHENSIVE METABOLIC PANEL
ALT: 11 U/L (ref 0–44)
AST: 17 U/L (ref 15–41)
Albumin: 3.4 g/dL — ABNORMAL LOW (ref 3.5–5.0)
Alkaline Phosphatase: 38 U/L (ref 38–126)
Anion gap: 5 (ref 5–15)
BUN: 8 mg/dL (ref 8–23)
CO2: 23 mmol/L (ref 22–32)
Calcium: 8.5 mg/dL — ABNORMAL LOW (ref 8.9–10.3)
Chloride: 107 mmol/L (ref 98–111)
Creatinine, Ser: 0.96 mg/dL (ref 0.61–1.24)
GFR, Estimated: >60 mL/min (ref >60)
Glucose, Bld: 100 mg/dL — ABNORMAL HIGH (ref 70–99)
Potassium: 4.2 mmol/L (ref 3.5–5.1)
Sodium: 135 mmol/L (ref 135–145)
Total Bilirubin: 1.2 mg/dL (ref 0.3–1.2)
Total Protein: 6.2 g/dL — ABNORMAL LOW (ref 6.5–8.1)

## 2022-01-10 LAB — CBG MONITORING, ED: Glucose-Capillary: 89 mg/dL (ref 70–99)

## 2022-01-10 LAB — PROTIME-INR
INR: 1.2 (ref 0.8–1.2)
Prothrombin Time: 15 seconds (ref 11.4–15.2)

## 2022-01-10 LAB — CBC
HCT: 47.5 % (ref 39.0–52.0)
Hemoglobin: 16.3 g/dL (ref 13.0–17.0)
MCH: 31.8 pg (ref 26.0–34.0)
MCHC: 34.3 g/dL (ref 30.0–36.0)
MCV: 92.6 fL (ref 80.0–100.0)
Platelets: 166 10*3/uL (ref 150–400)
RBC: 5.13 MIL/uL (ref 4.22–5.81)
RDW: 12.8 % (ref 11.5–15.5)
WBC: 6.3 10*3/uL (ref 4.0–10.5)
nRBC: 0 % (ref 0.0–0.2)

## 2022-01-10 LAB — RESP PANEL BY RT-PCR (FLU A&B, COVID) ARPGX2
Influenza A by PCR: NEGATIVE
Influenza B by PCR: NEGATIVE
SARS Coronavirus 2 by RT PCR: NEGATIVE

## 2022-01-10 LAB — HEMOGLOBIN A1C
Hgb A1c MFr Bld: 5.5 % (ref 4.8–5.6)
Mean Plasma Glucose: 111.15 mg/dL

## 2022-01-10 LAB — ETHANOL: Alcohol, Ethyl (B): <10 mg/dL (ref <10)

## 2022-01-10 LAB — APTT: aPTT: 35 seconds (ref 24–36)

## 2022-01-10 MED ORDER — ALBUTEROL SULFATE (2.5 MG/3ML) 0.083% IN NEBU: 2.5000 mg | INHALATION_SOLUTION | Freq: Four times a day (QID) | RESPIRATORY_TRACT | Status: DC | PRN

## 2022-01-10 MED ORDER — SENNOSIDES-DOCUSATE SODIUM 8.6-50 MG PO TABS: 1.0000 | ORAL_TABLET | Freq: Every evening | ORAL | Status: DC | PRN

## 2022-01-10 MED ORDER — ATORVASTATIN CALCIUM 80 MG PO TABS
80.0000 mg | ORAL_TABLET | Freq: Every day | ORAL | Status: DC
  Administered 2022-01-11 – 2022-01-12 (×2): 80 mg via ORAL
  Filled 2022-01-10 (×2): qty 1

## 2022-01-10 MED ORDER — SODIUM CHLORIDE 0.9 % IV SOLN: INTRAVENOUS | Status: DC

## 2022-01-10 MED ORDER — METOPROLOL SUCCINATE ER 50 MG PO TB24
50.0000 mg | ORAL_TABLET | Freq: Every day | ORAL | Status: DC
  Administered 2022-01-11 – 2022-01-12 (×2): 50 mg via ORAL
  Filled 2022-01-10 (×2): qty 1

## 2022-01-10 MED ORDER — ACETAMINOPHEN 160 MG/5ML PO SOLN: 650.0000 mg | ORAL | Status: DC | PRN

## 2022-01-10 MED ORDER — STROKE: EARLY STAGES OF RECOVERY BOOK
Freq: Once | Status: AC
  Filled 2022-01-10: qty 1

## 2022-01-10 MED ORDER — STROKE: EARLY STAGES OF RECOVERY BOOK: Freq: Once | Status: DC

## 2022-01-10 MED ORDER — ACETAMINOPHEN 325 MG PO TABS
650.0000 mg | ORAL_TABLET | ORAL | Status: DC | PRN
  Administered 2022-01-11: 650 mg via ORAL
  Filled 2022-01-10: qty 2

## 2022-01-10 MED ORDER — ACETAMINOPHEN 650 MG RE SUPP: 650.0000 mg | RECTAL | Status: DC | PRN

## 2022-01-10 MED ORDER — IOHEXOL 350 MG/ML SOLN
75.0000 mL | Freq: Once | INTRAVENOUS | Status: AC | PRN
  Administered 2022-01-10: 75 mL via INTRAVENOUS

## 2022-01-10 NOTE — Telephone Encounter (Signed)
Since the patient was having active chest pain and L side numbness I have let patient know he needs to go to the ED patient states he will go to [facility]

## 2022-01-10 NOTE — ED Notes (Signed)
Patient transported to CT 
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
## 2022-01-10 NOTE — ED Notes (Signed)
Pt arrived to room in ED, hooked to all monitoring equipment
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
## 2022-01-10 NOTE — ED Notes (Signed)
Called to activate a code stroke per dr. Wyvonnia Dusky

## 2022-01-10 NOTE — H&P (Addendum)
History and Physical    Patient: Mike Bean:[id number] DOB: 08-25-1960 DOA: 01/10/2022 DOS: the patient was seen and examined on 01/10/2022 PCP: Everardo Beals, NP  Patient coming from: Home  Chief Complaint:  Chief Complaint  Patient presents with   Numbness   HPI: Mike Bean is a 61 y.o. male with medical history significant of permanent atrial fibrillation, HFrEF, PPM placement-presented with numbness involving his left face and hand.  Per patient-he noticed that his left face/left hand was numb since around 3 AM this morning when he first woke up.  His last known normal time was around 11 PM the night before.  While waiting in the emergency room-patient claims that his left-sided numbness has improved somewhat but have not completely resolved.  He was evaluated by neurology and subsequently felt to require inpatient work-up.  He was not felt to require thrombolysis because he was outside the window.  Patient denies any headache, nausea, vomiting.  He denies any shortness of breath or chest pain.  Denies any diarrhea.  No history of abdominal pain.   Review of Systems: As mentioned in the history of present illness. All other systems reviewed and are negative. Past Medical History:  Diagnosis Date   CHF (congestive heart failure) ([place])    History of alcohol abuse    Hyperlipidemia    Nonischemic cardiomyopathy ([place])    Presumed to be tachycardia mediated   Pacemaker 2012   Persistent atrial fibrillation ([place]) 2012   Snores    Past Surgical History:  Procedure Laterality Date   APPENDECTOMY     S/P   BIV PACEMAKER GENERATOR CHANGEOUT N/A 02/08/2020   Procedure: BIV PACEMAKER GENERATOR CHANGEOUT;  Surgeon: Evans Lance, MD;  Location: [facility];  Service: Cardiovascular;  Laterality: N/A;   CARDIAC CATHETERIZATION  11/2009   Normal coronary arteries    PACEMAKER INSERTION  2012   [facility]   Social History:  reports that he has been smoking cigarettes.  He has been smoking an average of .5 packs per day. He has never used smokeless tobacco. He reports current alcohol use of about 21.0 standard drinks of alcohol per week. He reports that he does not use drugs.  No Known Allergies  Family History  Problem Relation Age of Onset   Heart attack Father 22       MI   Colon cancer Neg Hx     Prior to Admission medications   Medication Sig Start Date End Date Taking? Authorizing Provider  albuterol (PROVENTIL) (2.5 MG/3ML) 0.083% nebulizer solution Take 3 mLs (2.5 mg total) by nebulization every 6 (six) hours as needed for wheezing or shortness of breath. 06/09/19   Carmin Muskrat, MD  albuterol (VENTOLIN HFA) 108 (90 Base) MCG/ACT inhaler Inhale 2 puffs into the lungs every 6 (six) hours as needed for wheezing or shortness of breath.    [provider]  atorvastatin (LIPITOR) 80 MG tablet Take 1 tablet (80 mg total) by mouth daily. 05/09/21   Genia Harold, MD  Coenzyme Q10 (CO Q 10) 100 MG CAPS Take 100 ng by mouth daily.     [provider]  dapagliflozin propanediol (FARXIGA) 10 MG TABS tablet Take 1 tablet (10 mg total) by mouth daily before breakfast. 11/09/21   Evans Lance, MD  ELIQUIS 5 MG TABS tablet TAKE ONE TABLET BY MOUTH TWICE A DAY 01/04/22   Evans Lance, MD  furosemide (LASIX) 40 MG tablet Take 1 tablet (40  mg total) by mouth daily as needed (Swelling). 10/13/21 01/11/22  Baldwin Jamaica, PA-C  ibuprofen (ADVIL) 600 MG tablet Take 600 mg by mouth 3 (three) times daily. 10/07/21   [provider]  metoprolol succinate (TOPROL XL) 50 MG 24 hr tablet Take 1 tablet (50 mg total) by mouth daily. 08/13/21   Sande Rives E, PA-C  Omega-3 Fatty Acids (FISH OIL CONCENTRATE) 300 MG CAPS Take 300 mg by mouth daily.     [provider]  sacubitril-valsartan (ENTRESTO) 97-103 MG Take 1 tablet by mouth 2 (two) times daily. 06/14/21   Evans Lance, MD    Physical Exam: Vitals:   01/10/22 1330 01/10/22  1345 01/10/22 1445 01/10/22 1500  BP: 109/82 106/66 (!) 123/91 (!) 133/101  Pulse: 70 70 69 70  Resp: 15 16 (!) 24 (!) 26  Temp:      TempSrc:      SpO2: 100% 100% 100% 100%  Weight:      Height:       Gen Exam:Alert awake-not in any distress HEENT:atraumatic, normocephalic Chest: B/L clear to auscultation anteriorly CVS:S1S2 regular Abdomen:soft non tender, non distended Extremities:no edema Neurology: Mostly nonfocal-perhaps very mild left-sided weakness at best. Skin: no rash   Data Reviewed:    Latest Ref Rng & Units 01/10/2022    1:10 PM 01/26/2020    4:50 PM 06/09/2019    3:29 PM  CBC  WBC 4.0 - 10.5 K/uL 6.3  9.5  9.6   Hemoglobin 13.0 - 17.0 g/dL 16.3  17.1  17.9   Hematocrit 39.0 - 52.0 % 47.5  50.7  52.8   Platelets 150 - 400 K/uL 166  191  175         Latest Ref Rng & Units 01/10/2022    1:10 PM 07/06/2021    4:12 PM 03/14/2021   12:11 PM  BMP  Glucose 70 - 99 mg/dL 100  65  80   BUN 8 - 23 mg/dL '8  17  14   '$ Creatinine 0.61 - 1.24 mg/dL 0.96  1.21  0.94   BUN/Creat Ratio 10 - '24  14  15   '$ Sodium 135 - 145 mmol/L 135  138  137   Potassium 3.5 - 5.1 mmol/L 4.2  4.4  4.7   Chloride 98 - 111 mmol/L 107  99  99   CO2 22 - 32 mmol/L '23  24  27   '$ Calcium 8.9 - 10.3 mg/dL 8.5  9.1  9.1     Assessment and Plan: Acute CVA: Continues to have left sided numbness-unfortunately-pacemaker is noncompatible with MRI.  Recommendations from neurology are to repeat CT head in 12 hours, echo, carotid Dopplers and to hold Eliquis until repeat CT head can be performed.  Await evaluation from PT/OT/SLP services.  Permanent atrial fibrillation: Rate controlled-continue Toprol-hold Eliquis per neurology until repeat CT head can be performed.  Per patient-he has been compliant with his anticoagulation.  HLD: Continue Lipitor-check fasting lipids tomorrow morning.  Chronic HFrEF: Euvolemic-hold Entresto/diuretics/Farxiga for a day or so-allow some amount of permissive  hypertension.  Permanent pacemaker implantation: Continue telemetry monitoring.   Advance Care Planning:   Code Status: Full Code   Consults: Neurology  Family Communication: None at bedside.  Severity of Illness: The appropriate patient status for this patient is INPATIENT. Inpatient status is judged to be reasonable and necessary in order to provide the required intensity of service to ensure the patient's safety. The patient's presenting symptoms, physical exam  findings, and initial radiographic and laboratory data in the context of their chronic comorbidities is felt to place them at high risk for further clinical deterioration. Furthermore, it is not anticipated that the patient will be medically stable for discharge from the hospital within 2 midnights of admission.   * I certify that at the point of admission it is my clinical judgment that the patient will require inpatient hospital care spanning beyond 2 midnights from the point of admission due to high intensity of service, high risk for further deterioration and high frequency of surveillance required.*  Author: Oren Binet, MD 01/10/2022 4:13 PM  For on call review [email or web address].

## 2022-01-10 NOTE — ED Provider Notes (Signed)
[facility] EMERGENCY DEPARTMENT Provider Note   CSN: [id number] Arrival date & time: 01/10/22  1213  An emergency department physician performed an initial assessment on this suspected stroke patient at 33.  History  Chief Complaint  Patient presents with   Numbness    Mike Bean is a 61 y.o. male.  Patient with a history of pacemaker, atrial fibrillation on Eliquis, nonischemic cardiomyopathy, hyperlipidemia presenting with numbness to his left face and hand.  He noticed the symptoms about 3 AM today and felt normal when he went to bed at 11 PM last night.  Has numbness and tingling to his left face and feels like "Novocain shot" as well as a strange feeling in his left arm with some mild decreased grip strength.  Also having numbness to his leg as well.  Denies any headache, difficulty breathing, chest pain or shortness of breath.  No facial droop.  Compliant with his Eliquis. No abdominal pain, nausea or vomiting.  Has not noticed any weakness in his leg but does feel numb sensation there as well.  The history is provided by the patient.       Home Medications Prior to Admission medications   Medication Sig Start Date End Date Taking? Authorizing Provider  albuterol (PROVENTIL) (2.5 MG/3ML) 0.083% nebulizer solution Take 3 mLs (2.5 mg total) by nebulization every 6 (six) hours as needed for wheezing or shortness of breath. 06/09/19   Carmin Muskrat, MD  albuterol (VENTOLIN HFA) 108 (90 Base) MCG/ACT inhaler Inhale 2 puffs into the lungs every 6 (six) hours as needed for wheezing or shortness of breath.    [provider]  atorvastatin (LIPITOR) 80 MG tablet Take 1 tablet (80 mg total) by mouth daily. 05/09/21   Genia Harold, MD  Coenzyme Q10 (CO Q 10) 100 MG CAPS Take 100 ng by mouth daily.     [provider]  dapagliflozin propanediol (FARXIGA) 10 MG TABS tablet Take 1 tablet (10 mg total) by mouth daily before breakfast. 11/09/21    Evans Lance, MD  ELIQUIS 5 MG TABS tablet TAKE ONE TABLET BY MOUTH TWICE A DAY 01/04/22   Evans Lance, MD  furosemide (LASIX) 40 MG tablet Take 1 tablet (40 mg total) by mouth daily as needed (Swelling). 10/13/21 01/11/22  Baldwin Jamaica, PA-C  ibuprofen (ADVIL) 600 MG tablet Take 600 mg by mouth 3 (three) times daily. 10/07/21   [provider]  metoprolol succinate (TOPROL XL) 50 MG 24 hr tablet Take 1 tablet (50 mg total) by mouth daily. 08/13/21   Sande Rives E, PA-C  Omega-3 Fatty Acids (FISH OIL CONCENTRATE) 300 MG CAPS Take 300 mg by mouth daily.     [provider]  sacubitril-valsartan (ENTRESTO) 97-103 MG Take 1 tablet by mouth 2 (two) times daily. 06/14/21   Evans Lance, MD      Allergies    Patient has no known allergies.    Review of Systems   Review of Systems  Eyes:  Positive for visual disturbance.  Genitourinary:  Negative for dysuria and hematuria.  Neurological:  Positive for weakness and numbness. Negative for dizziness and headaches.    all other systems are negative except as noted in the HPI and PMH.   Physical Exam Updated Vital Signs BP 109/80   Pulse 70   Temp 98.5 F (36.9 C) (Oral)   Resp 20   Ht '5\' 10"'$  (1.778 m)   Wt 81.6 kg   SpO2  97%   BMI 25.83 kg/m  Physical Exam Vitals and nursing note reviewed.  Constitutional:      General: He is not in acute distress.    Appearance: He is well-developed.  HENT:     Head: Normocephalic and atraumatic.     Mouth/Throat:     Pharynx: No oropharyngeal exudate.  Eyes:     Conjunctiva/sclera: Conjunctivae normal.     Pupils: Pupils are equal, round, and reactive to light.  Neck:     Comments: No meningismus. Cardiovascular:     Rate and Rhythm: Normal rate and regular rhythm.     Heart sounds: Normal heart sounds. No murmur heard. Pulmonary:     Effort: Pulmonary effort is normal. No respiratory distress.     Breath sounds: Normal breath sounds.  Abdominal:      Palpations: Abdomen is soft.     Tenderness: There is no abdominal tenderness. There is no guarding or rebound.  Musculoskeletal:        General: No tenderness. Normal range of motion.     Cervical back: Normal range of motion and neck supple.  Skin:    General: Skin is warm.  Neurological:     Mental Status: He is alert and oriented to person, place, and time.     Cranial Nerves: No cranial nerve deficit.     Motor: No abnormal muscle tone.     Coordination: Coordination normal.     Comments:  5/5 strength throughout. CN 2-12 intact.Equal grip strength.  Subjective numbness to left face and left arm.  Equal grip strength bilaterally.  No cranial drift.  No ataxia on finger-to-nose.  Subjective numbness to left leg.  Psychiatric:        Behavior: Behavior normal.     ED Results / Procedures / Treatments   Labs (all labs ordered are listed, but only abnormal results are displayed) Labs Reviewed  URINALYSIS, ROUTINE W REFLEX MICROSCOPIC - Abnormal; Notable for the following components:      Result Value   Color, Urine STRAW (*)    Specific Gravity, Urine 1.004 (*)    Glucose, UA 150 (*)    All other components within normal limits  COMPREHENSIVE METABOLIC PANEL - Abnormal; Notable for the following components:   Glucose, Bld 100 (*)    Calcium 8.5 (*)    Total Protein 6.2 (*)    Albumin 3.4 (*)    All other components within normal limits  RESP PANEL BY RT-PCR (FLU A&B, COVID) ARPGX2  PROTIME-INR  APTT  CBC  ETHANOL  DIFFERENTIAL  RAPID URINE DRUG SCREEN, HOSP PERFORMED  HEMOGLOBIN A1C  I-STAT CHEM 8, ED  I-STAT CHEM 8, ED  CBG MONITORING, ED    EKG EKG Interpretation  Date/Time:  Wednesday January 10 2022 12:33:28 EDT Ventricular Rate:  70 PR Interval:    QRS Duration: 124 QT Interval:  462 QTC Calculation: 498 R Axis:   262 Text Interpretation: Ventricular-paced rhythm Biventricular pacemaker detected Abnormal ECG When compared with ECG of 09-Jun-2019  15:16, PREVIOUS ECG IS PRESENT No significant change was found Confirmed by Ezequiel Essex [phone]) on 01/10/2022 1:40:22 PM  Radiology CT HEAD CODE STROKE WO CONTRAST  Result Date: 01/10/2022 CLINICAL DATA:  Code stroke. Neuro deficit, acute, stroke suspected. Left facial numbness. EXAM: CT HEAD WITHOUT CONTRAST TECHNIQUE: Contiguous axial images were obtained from the base of the skull through the vertex without intravenous contrast. RADIATION DOSE REDUCTION: This exam was performed according to the departmental dose-optimization program which  includes automated exposure control, adjustment of the mA and/or kV according to patient size and/or use of iterative reconstruction technique. COMPARISON:  Head CT 03/20/2021 FINDINGS: Brain: There is no evidence of an acute cortically based infarct, intracranial hemorrhage, mass, midline shift, or extra-axial fluid collection. A 4 mm hypodensity in the right thalamus was not clearly present on the prior CT and may reflect an age-indeterminate lacunar infarct or artifact. A chronic left parietal infarct is unchanged. Hypodensities elsewhere in the cerebral white matter bilaterally are nonspecific but compatible with mild chronic small vessel ischemic disease. The ventricles are normal in size. Vascular: Calcified atherosclerosis at the skull base. No hyperdense vessel. Skull: No fracture or suspicious osseous lesion. Sinuses/Orbits: Minimal mucosal thickening in the paranasal sinuses. Clear mastoid air cells. Unremarkable orbits. Other: None. ASPECTS [facility] Stroke Program Early CT Score) - Ganglionic level infarction (caudate, lentiform nuclei, internal capsule, insula, M1-M3 cortex): 7 - Supraganglionic infarction (M4-M6 cortex): 3 Total score (0-10 with 10 being normal): 10 IMPRESSION: 1. No evidence of an acute cortically based infarct or intracranial hemorrhage. ASPECTS of 10. 2. Possible age-indeterminate lacunar infarct in the right thalamus. 3. Chronic left  parietal infarct. These results were communicated to Dr. Quinn Axe at 1:25 pm on 01/10/2022 by text page via the [facility] messaging system. Electronically Signed   By: Logan Bores M.D.   On: 01/10/2022 13:26    Procedures Procedures    Medications Ordered in ED Medications - No data to display  ED Course/ Medical Decision Making/ A&P                           Medical Decision Making Amount and/or Complexity of Data Reviewed Labs: ordered. Decision-making details documented in ED Course. Radiology: ordered and independent interpretation performed. Decision-making details documented in ED Course. ECG/medicine tests: ordered and independent interpretation performed. Decision-making details documented in ED Course.  Risk Decision regarding hospitalization.  Left-sided numbness since 3 AM.  Last seen normal was at 11 PM.  He is not a candidate for thrombolytics as he is out of the window and on Eliquis.  Given visual changes and VAN+, code stroke was activated.  CT obtained shows no hemorrhage.  Results reviewed and interpreted by me.  Seen with Dr. Quinn Axe of neurology.  She feels that age-indeterminate lacunar infarct is old. Results reviewed and interpreted by me.  Unable to do MRI due to pacemaker  Dr. Quinn Axe has seen patient.  NIH stroke scale is 2. She recommends repeat CT scan in 12 hours.  Hold Eliquis until then.  Recommends admission for stroke work-up including echocardiogram and carotid Dopplers.  Labs appear to be at baseline. Discussed with Dr. Sloan Leiter.        Final Clinical Impression(s) / ED Diagnoses Final diagnoses:  Cerebrovascular accident (CVA), unspecified mechanism [facility])    Rx / [place] Orders ED Discharge Orders     None         Dominque Marlin, Annie Main, MD 01/10/22 [phone]

## 2022-01-10 NOTE — ED Provider Triage Note (Signed)
Emergency Medicine Provider Triage Evaluation Note  Mike Bean , a 61 y.o. male  was evaluated in triage.  Pt complains of left-sided facial numbness that started around 3 AM.  Woke up with symptoms.  LKW last night before bed.  Also with a "strange feeling" in his left arm, though states he is able to move it and feel sensation adequately.  Denies facial droop, disequilibrium, headache, vision changes, chest pain, or shortness of breath.  Hx of A-fib, on chronic Eliquis.  Also Hx of COPD, alcoholic cardiomyopathy, CHF.  States he had a prior CVA, though do not see this in his immediate records.  Denies fevers  Review of Systems  Positive:  Negative: See above  Physical Exam  BP 116/78 (BP Location: Right Arm)   Pulse 70   Temp 98.5 F (36.9 C) (Oral)   Resp 20   Ht '5\' 10"'$  (1.778 m)   Wt 81.6 kg   SpO2 98%   BMI 25.83 kg/m  Gen:   Awake, no distress   Resp:  Normal effort  MSK:   Moves extremities without difficulty  Other:  Sensation appears grossly intact, though subjective change in sensation to an area on the lower left side of the face.  Facial nerve appears grossly intact.  CN VIII through CN XII appear grossly intact.  Decreased visual field of the left eye on the upper and lower medial aspect, patient does not know if this is baseline or not, otherwise without visual deficits.  Negative pronator drift.  Coordination and gait appears grossly intact.  Medical Decision Making  Medically screening exam initiated at 12:45 PM.  Appropriate orders placed.  AUNDREA HIGGINBOTHAM was informed that the remainder of the evaluation will be completed by another provider, this initial triage assessment does not replace that evaluation, and the importance of remaining in the ED until their evaluation is complete.  Pt outside LVO stroke window   Prince Rome, [place] 64/33/29 1253

## 2022-01-10 NOTE — ED Triage Notes (Addendum)
Patient complains of numbness to left side of face that started this am around 3am.  Patient reports it woke him up.  No facial droop or weakness noted.  Patient has hx of alcohol abuse, CHF.  Patient reports he does drink every day.  Patient very talkative in triage.

## 2022-01-10 NOTE — Code Documentation (Signed)
Stroke Response Nurse Documentation Code Documentation  Mike Bean is a 61 y.o. male arriving to [facility]  via [employer] on 01/10/2022 with past medical hx of a-fib, cardiomyopathy, ETOH + tobacco use, HLD, pacemaker, CHF. On Eliquis (apixaban) daily. Code stroke was activated by ED.   Patient from home where he was LKW at 23:30 before bed and now complaining of left sided weakness . Pt woke up at 03:30 experiencing left sided weakness, went back to bed, and woke up at 10:00 with left sided numbness and weakness.  Stroke team at the bedside on patient arrival. Labs drawn and patient cleared for CT by Dr.Rancour. Patient to CT with team. NIHSS 2, see documentation for details and code stroke times. Patient with left leg weakness and dysarthria  on exam. The following imaging was completed:  CT Head. Patient is not a candidate for IV Thrombolytic due to outside window, per MD. Patient is not a candidate for IR due to no LVO suspected per MD.   Care Plan: q2 vital signs+NIH.   Bedside handoff with ED RN Vicente Males.    Charise Carwin  Stroke Response RN

## 2022-01-11 ENCOUNTER — Inpatient Hospital Stay (HOSPITAL_COMMUNITY): Payer: PRIVATE HEALTH INSURANCE

## 2022-01-11 ENCOUNTER — Telehealth: Payer: Self-pay | Admitting: Internal Medicine

## 2022-01-11 ENCOUNTER — Inpatient Hospital Stay (HOSPITAL_BASED_OUTPATIENT_CLINIC_OR_DEPARTMENT_OTHER): Payer: PRIVATE HEALTH INSURANCE

## 2022-01-11 DIAGNOSIS — I639 Cerebral infarction, unspecified: Secondary | ICD-10-CM | POA: Diagnosis not present

## 2022-01-11 DIAGNOSIS — I6389 Other cerebral infarction: Secondary | ICD-10-CM

## 2022-01-11 DIAGNOSIS — Z950 Presence of cardiac pacemaker: Secondary | ICD-10-CM | POA: Diagnosis not present

## 2022-01-11 LAB — LIPID PANEL
Cholesterol: 155 mg/dL (ref 0–200)
HDL: 34 mg/dL — ABNORMAL LOW (ref >40)
LDL Cholesterol: 109 mg/dL — ABNORMAL HIGH (ref 0–99)
Total CHOL/HDL Ratio: 4.6 RATIO
Triglycerides: 62 mg/dL (ref <150)
VLDL: 12 mg/dL (ref 0–40)

## 2022-01-11 LAB — ECHOCARDIOGRAM COMPLETE BUBBLE STUDY
Calc EF: 35.6 %
S' Lateral: 4.8 cm
Single Plane A2C EF: 36 %
Single Plane A4C EF: 36.8 %

## 2022-01-11 LAB — HIV ANTIBODY (ROUTINE TESTING W REFLEX): HIV Screen 4th Generation wRfx: NONREACTIVE

## 2022-01-11 MED ORDER — ADULT MULTIVITAMIN W/MINERALS CH
1.0000 | ORAL_TABLET | Freq: Every day | ORAL | Status: DC
  Administered 2022-01-12: 1 via ORAL
  Filled 2022-01-11 (×2): qty 1

## 2022-01-11 MED ORDER — FOLIC ACID 1 MG PO TABS
1.0000 mg | ORAL_TABLET | Freq: Every day | ORAL | Status: DC
  Administered 2022-01-12: 1 mg via ORAL
  Filled 2022-01-11 (×2): qty 1

## 2022-01-11 MED ORDER — DIPHENHYDRAMINE HCL 25 MG PO CAPS
50.0000 mg | ORAL_CAPSULE | Freq: Once | ORAL | Status: AC
  Administered 2022-01-11: 50 mg via ORAL
  Filled 2022-01-11: qty 2

## 2022-01-11 MED ORDER — LORAZEPAM 2 MG/ML IJ SOLN: 1.0000 mg | INTRAMUSCULAR | Status: DC | PRN

## 2022-01-11 MED ORDER — LORAZEPAM 2 MG/ML IJ SOLN
0.5000 mg | Freq: Once | INTRAMUSCULAR | Status: AC
  Administered 2022-01-11: 0.5 mg via INTRAVENOUS
  Filled 2022-01-11: qty 1

## 2022-01-11 MED ORDER — APIXABAN 5 MG PO TABS
5.0000 mg | ORAL_TABLET | Freq: Two times a day (BID) | ORAL | Status: DC
  Administered 2022-01-11 – 2022-01-12 (×3): 5 mg via ORAL
  Filled 2022-01-11 (×3): qty 1

## 2022-01-11 MED ORDER — NICOTINE 7 MG/24HR TD PT24
7.0000 mg | MEDICATED_PATCH | Freq: Every day | TRANSDERMAL | Status: DC
  Administered 2022-01-12 (×2): 7 mg via TRANSDERMAL
  Filled 2022-01-11 (×2): qty 1

## 2022-01-11 MED ORDER — THIAMINE HCL 100 MG/ML IJ SOLN: 100.0000 mg | Freq: Every day | INTRAMUSCULAR | Status: DC

## 2022-01-11 MED ORDER — LORAZEPAM 1 MG PO TABS
1.0000 mg | ORAL_TABLET | ORAL | Status: DC | PRN
  Administered 2022-01-11 – 2022-01-12 (×2): 1 mg via ORAL
  Filled 2022-01-11 (×3): qty 1

## 2022-01-11 MED ORDER — THIAMINE MONONITRATE 100 MG PO TABS
100.0000 mg | ORAL_TABLET | Freq: Every day | ORAL | Status: DC
  Administered 2022-01-11 – 2022-01-12 (×2): 100 mg via ORAL
  Filled 2022-01-11 (×2): qty 1

## 2022-01-11 NOTE — Progress Notes (Signed)
SLP Cancellation Note  Patient Details Name: Mike Bean MRN: [id number] DOB: 07-04-1960   Cancelled treatment:       Reason Eval/Treat Not Completed: Patient declined, no reason specified ("Now is not a good time"). SLP will f/u next date.   Coryn Mosso MA, CCC-SLP    Rmani Kellogg Meryl 01/11/2022, 11:10 AM

## 2022-01-11 NOTE — Progress Notes (Signed)
Echocardiogram 2D Echocardiogram has been performed.  Mike Bean 01/11/2022, 3:02 PM

## 2022-01-11 NOTE — Telephone Encounter (Signed)
Pt is requesting call back from [facility], Dr. Tanna Furry nurse, in regards to reports he is getting while admitted in ED at [facility]. He says he is getting conflicting information from ED than what Dr. Lovena Le has given him and he would like clarification and also someone to come see him in hosp. Please advise.
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
## 2022-01-11 NOTE — Progress Notes (Signed)
PT Cancellation Note  Patient Details Name: Mike Bean MRN: [id number] DOB: 06/29/1960   Cancelled Treatment:    Reason Eval/Treat Not Completed: PT checked on patient for a third time today. Pt agitated regarding his meds, raising voice and threatening to leave AMA. Will not proceed with eval at this time. Will continue to follow.    Thelma Comp 01/11/2022, 11:21 AM  Rolinda Roan, PT, DPT Acute Rehabilitation Services Secure Chat Preferred Office: [phone]

## 2022-01-11 NOTE — Progress Notes (Signed)
Mobility Specialist: Progress Note   01/11/22 1657  Mobility  Activity Ambulated with assistance in room  Level of Assistance Standby assist, set-up cues, supervision of patient - no hands on  Assistive Device None  Distance Ambulated (ft) 400 ft  Activity Response Tolerated well  $Mobility charge 1 Mobility   Received pt in bed having no complaints and agreeable to mobility. Pt was asymptomatic throughout ambulation and returned to room w/o fault. Left in bed w/ call bell in reach and all needs met.  [facility] Mahir Prabhakar Mobility Specialist Mobility Specialist [address]: [phone]
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
## 2022-01-11 NOTE — Progress Notes (Signed)
Pt removed telemetry at this time, family at bedside encouraged to talk to pt about reapplying, pt is sleeping and is upset with the IVC order in place.

## 2022-01-11 NOTE — Plan of Care (Signed)
Care plan reviewed.

## 2022-01-11 NOTE — Progress Notes (Signed)
Patient arrived in the unit at 2030pm from [facility], A&Ox4, denied of pain, but still seemed slightly agitated and impulsive, initiated telemetry monitor, pt is resting in a bed, personal belongings and call bell are within reach, and will continue to monitor closely.

## 2022-01-11 NOTE — TOC Initial Note (Signed)
Transition of Care [facility]) - Initial/Assessment Note    Patient Details  Name: Mike Bean MRN: [id number] Date of Birth: 1960-05-25  Transition of Care [facility]) CM/SW Contact:    Pollie Friar, RN Phone Number: 01/11/2022, 4:40 PM  Clinical Narrative:                 Pt is from home with his brother. He denies any DME use at home.  Pt manages his own medications and denies any issues.  CM received message that his son was interested in [place] rehab for the patient. Pt has declined [facility] rehab.  TOC following.  Expected Discharge Plan: Home/Self Care Barriers to Discharge: Continued Medical Work up   Patient Goals and CMS Choice        Expected Discharge Plan and Services Expected Discharge Plan: Home/Self Care   Discharge Planning Services: CM Consult   Living arrangements for the past 2 months: Single Family Home                                      Prior Living Arrangements/Services Living arrangements for the past 2 months: Single Family Home Lives with:: Siblings Patient language and need for interpreter reviewed:: Yes Do you feel safe going back to the place where you live?: Yes            Criminal Activity/Legal Involvement Pertinent to Current Situation/Hospitalization: No - Comment as needed  Activities of Daily Living      Permission Sought/Granted                  Emotional Assessment Appearance:: Appears stated age Attitude/Demeanor/Rapport: Engaged Affect (typically observed): Accepting Orientation: : Oriented to Self, Oriented to Place, Oriented to  Time, Oriented to Situation Alcohol / Substance Use: Alcohol Use, Illicit Drugs Psych Involvement: Yes (comment)  Admission diagnosis:  Acute CVA (cerebrovascular accident) [facility]) [I63.9] Cerebrovascular accident (CVA), unspecified mechanism ([place]) [I63.9] CVA (cerebral vascular accident) [facility]) [I63.9] Patient Active Problem List   Diagnosis Date Noted   CVA (cerebral vascular accident)  ([place]) 01/11/2022   Acute CVA (cerebrovascular accident) ([place]) 01/10/2022   Pacemaker 65/06/5463   ALCOHOLIC CARDIOMYOPATHY 68/03/7516   SNORING 04/19/2010   ATRIAL FIBRILLATION 04/18/2010   Congestive heart failure ([place]) 04/18/2010   COPD 04/18/2010   PCP:  Everardo Beals, NP Pharmacy:   [facility] [id number] - [address], [place] [address] Phone: [phone] Fax: [phone]     Social Determinants of Health (SDOH) Interventions    Readmission Risk Interventions     No data to display

## 2022-01-11 NOTE — Progress Notes (Addendum)
STROKE TEAM PROGRESS NOTE   INTERVAL HISTORY His family was at bedside. Patient is upset that he can not have an MRI d/t pacemaker. He stated that he can have an MRI at [facility] someone just needs to schedule it. Patient will have to follow up with provider an outpatient MRI at [facility]. Re-peat head CT was done at [facility] and did not show an acute infarct. No hemorrhage. Okay to re-start Eliquis. Patient has been advised to not miss doses, stop smoking, and stop cocaine and any other illicit drugs.   Vitals:   01/10/22 2037 01/10/22 2345 01/11/22 0300 01/11/22 0835  BP: 126/88 101/62 (!) 126/102 115/66  Pulse: 70 70 73 73  Resp: '16 18 17 16  '$ Temp: 98 F (36.7 C) 97.8 F (36.6 C) 97.7 F (36.5 C)   TempSrc: Oral Oral Axillary   SpO2: 100% 98% 100% 98%  Weight:      Height:       CBC:  Recent Labs  Lab 01/10/22 1310  WBC 6.3  NEUTROABS 4.2  HGB 16.3  HCT 47.5  MCV 92.6  PLT 924   Basic Metabolic Panel:  Recent Labs  Lab 01/10/22 1310  NA 135  K 4.2  CL 107  CO2 23  GLUCOSE 100*  BUN 8  CREATININE 0.96  CALCIUM 8.5*   Lipid Panel:  Recent Labs  Lab 01/11/22 0530  CHOL 155  TRIG 62  HDL 34*  CHOLHDL 4.6  VLDL 12  LDLCALC 109*   HgbA1c:  Recent Labs  Lab 01/10/22 1310  HGBA1C 5.5   Urine Drug Screen:  Recent Labs  Lab 01/10/22 1436  LABOPIA NONE DETECTED  COCAINSCRNUR POSITIVE*  LABBENZ NONE DETECTED  AMPHETMU NONE DETECTED  THCU POSITIVE*  LABBARB NONE DETECTED    Alcohol Level  Recent Labs  Lab 01/10/22 1310  ETH <10    IMAGING past 24 hours CT HEAD WO CONTRAST (5MM)  Result Date: 01/11/2022 CLINICAL DATA:  Stroke follow-up EXAM: CT HEAD WITHOUT CONTRAST TECHNIQUE: Contiguous axial images were obtained from the base of the skull through the vertex without intravenous contrast. RADIATION DOSE REDUCTION: This exam was performed according to the departmental dose-optimization program which includes automated exposure control, adjustment of the mA  and/or kV according to patient size and/or use of iterative reconstruction technique. COMPARISON:  None Available. FINDINGS: Brain: Old left parietal infarct. Brain parenchyma otherwise normal. No acute hemorrhage. Vascular: No abnormal hyperdensity of the major intracranial arteries or dural venous sinuses. No intracranial atherosclerosis. Skull: The visualized skull base, calvarium and extracranial soft tissues are normal. Sinuses/Orbits: No fluid levels or advanced mucosal thickening of the visualized paranasal sinuses. No mastoid or middle ear effusion. The orbits are normal. IMPRESSION: 1. No acute intracranial abnormality. 2. Old left parietal infarct. Electronically Signed   By: Ulyses Jarred M.D.   On: 01/11/2022 03:51   CT ANGIO HEAD NECK W WO CM  Result Date: 01/10/2022 CLINICAL DATA:  Neuro deficit, acute, stroke suspected. Left facial numbness. EXAM: CT ANGIOGRAPHY HEAD AND NECK TECHNIQUE: Multidetector CT imaging of the head and neck was performed using the standard protocol during bolus administration of intravenous contrast. Multiplanar CT image reconstructions and MIPs were obtained to evaluate the vascular anatomy. Carotid stenosis measurements (when applicable) are obtained utilizing NASCET criteria, using the distal internal carotid diameter as the denominator. RADIATION DOSE REDUCTION: This exam was performed according to the departmental dose-optimization program which includes automated exposure control, adjustment of the mA and/or kV according to patient  size and/or use of iterative reconstruction technique. CONTRAST:  80m OMNIPAQUE IOHEXOL 350 MG/ML SOLN COMPARISON:  None Available. FINDINGS: CTA NECK FINDINGS Aortic arch: Standard 3 vessel aortic arch with a small to moderate amount of calcified plaque. No significant stenosis of the arch vessel origins. Right carotid system: Patent with a small amount of calcified plaque at the carotid bifurcation and mixed calcified and soft plaque at  the distal aspect of the carotid bulb. No evidence of a significant stenosis or dissection. Left carotid system: Patent with a small amount of predominantly calcified plaque in the carotid bulb. No evidence of a significant stenosis or dissection. Vertebral arteries: Patent and codominant with calcified plaque at the vertebral origins resulting in moderate stenosis on the right and no significant stenosis on the left. Skeleton: Cervicothoracic disc degeneration, focally severe at T1-2. Moderate cervical facet arthrosis. Other neck: No evidence of cervical lymphadenopathy or mass. Upper chest: Centrilobular and paraseptal emphysema. Review of the MIP images confirms the above findings CTA HEAD FINDINGS Anterior circulation: The internal carotid arteries are patent from skull base to carotid termini with mild atherosclerotic irregularity but no significant stenosis. ACAs and MCAs are patent without evidence of a proximal branch occlusion or significant proximal stenosis. The right ACA is dominant. No aneurysm is identified. Posterior circulation: The intracranial vertebral arteries are widely patent to the basilar. Patent PICA and SCA origins are seen bilaterally. The basilar artery is patent and irregular with mild stenosis distally. There are left larger than right posterior communicating arteries with hypoplasia of the left P1 segment. The PCAs are patent with mild branch vessel irregularity but no evidence of a significant proximal stenosis. No aneurysm is identified. Venous sinuses: Patent. Prominent arachnoid granulation in the posterior aspect of the superior sagittal sinus. Anatomic variants: Predominantly fetal type origin of the left PCA. Dominant right ACA. Review of the MIP images confirms the above findings IMPRESSION: 1. No large vessel occlusion. 2. Mild intracranial atherosclerosis without flow limiting proximal stenosis. 3. Moderate right vertebral artery origin stenosis. 4. Cervical carotid artery  atherosclerosis without significant stenosis. 5. Aortic Atherosclerosis (ICD10-I70.0) and Emphysema (ICD10-J43.9). Electronically Signed   By: ALogan BoresM.D.   On: 01/10/2022 17:29   CT HEAD CODE STROKE WO CONTRAST  Result Date: 01/10/2022 CLINICAL DATA:  Code stroke. Neuro deficit, acute, stroke suspected. Left facial numbness. EXAM: CT HEAD WITHOUT CONTRAST TECHNIQUE: Contiguous axial images were obtained from the base of the skull through the vertex without intravenous contrast. RADIATION DOSE REDUCTION: This exam was performed according to the departmental dose-optimization program which includes automated exposure control, adjustment of the mA and/or kV according to patient size and/or use of iterative reconstruction technique. COMPARISON:  Head CT 03/20/2021 FINDINGS: Brain: There is no evidence of an acute cortically based infarct, intracranial hemorrhage, mass, midline shift, or extra-axial fluid collection. A 4 mm hypodensity in the right thalamus was not clearly present on the prior CT and may reflect an age-indeterminate lacunar infarct or artifact. A chronic left parietal infarct is unchanged. Hypodensities elsewhere in the cerebral white matter bilaterally are nonspecific but compatible with mild chronic small vessel ischemic disease. The ventricles are normal in size. Vascular: Calcified atherosclerosis at the skull base. No hyperdense vessel. Skull: No fracture or suspicious osseous lesion. Sinuses/Orbits: Minimal mucosal thickening in the paranasal sinuses. Clear mastoid air cells. Unremarkable orbits. Other: None. ASPECTS ([facility]) - Ganglionic level infarction (caudate, lentiform nuclei, internal capsule, insula, M1-M3 cortex): 7 - Supraganglionic infarction (M4-M6  cortex): 3 Total score (0-10 with 10 being normal): 10 IMPRESSION: 1. No evidence of an acute cortically based infarct or intracranial hemorrhage. ASPECTS of 10. 2. Possible age-indeterminate lacunar  infarct in the right thalamus. 3. Chronic left parietal infarct. These results were communicated to Dr. Quinn Axe at 1:25 pm on 01/10/2022 by text page via the [facility] messaging system. Electronically Signed   By: Logan Bores M.D.   On: 01/10/2022 13:26    PHYSICAL EXAM Physical Exam  Constitutional: Appears well-developed and well-nourished.  Psych: Affect appropriate to situation Eyes: Normal external eye and conjunctiva. HENT: Normocephalic, no lesions, without obvious abnormality.   Musculoskeletal-no joint tenderness, deformity or swelling Cardiovascular: Normal rate and regular rhythm.  Respiratory: Effort normal, non-labored breathing saturations WNL GI: Soft.  No distension. There is no tenderness.  Skin: WDI, BLE with significant discoloration.    Neuro:  Mental Status: Alert, oriented, thought content appropriate.  Speech fluent without evidence of aphasia.  Able to follow commands without difficulty. Cranial Nerves: II: Visual fields grossly normal,  III,IV, VI: ptosis not present, extra-ocular motions intact bilaterally pupils equal, round, reactive to light and accommodation V,VII: smile symmetric, subjective left facial numbness ( improved since admission) facial light touch sensation normal bilaterally VIII: hearing normal bilaterally IX,X: uvula rises symmetrically XI: bilateral shoulder shrug XII: midline tongue extension Motor: Right : Upper extremity   5/5  Left:     Upper extremity   5/5  Lower extremity   5/5   Lower extremity   5/5 Tone and bulk:normal tone throughout; no atrophy noted Sensory: light touch intact throughout, bilaterally Cerebellar: FNF intact Gait: deferred     ASSESSMENT/PLAN Mike Bean is a 61 y.o. male with past medical history of permanent atrial fibrillation, HFrEF, PPM placement-presented with numbness involving his left face and hand.  His last normal was around  2330 last night when he went to bed. He states that he woke up at 0300  after having a dream about being in a car accident and he noticed his left side felt numb but he went back to bed. He then woke up at 1000 and called the Cardiology office for his pacemaker to get a read and they told him to go the the ED when he explained his symptoms. He describes his mouth as feeling like he just had a novocain shot at the dentist. He is on Eliquis, ,but endorses missing 4-5 days because of his prescription lapsing (resumed rx 5 days ago). He does endorse smoking a pack per day and drinking about 12 drinks per week. He refuses to answer about drug use. On assessment today. NIHSS  0. With reported improved numbness.   TIA:       Code Stroke CT head No acute abnormality. Age indeterminate lacunar infarct right thalamus, old left parietal infarct. Atrophy. ASPECTS 10.    Re-peat Head CT: no acute infarct CTA head & neck no LVO MRI  unable to obtain 2D Echo pending LDL 109 HgbA1c 5.5 VTE prophylaxis - Eliquis    Diet   Diet Heart Room service appropriate? Yes; Fluid consistency: Thin; Fluid restriction: 1200 mL Fluid   Eliquis (apixaban) daily prior to admission, now on Eliquis (apixaban) daily.  Therapy recommendations:  PTOT: pending SLP: refused Disposition:  pending  Hypertension Home meds:  entresto, toprol XL '50mg'$  daily Stable Permissive hypertension (OK if < 220/120) but gradually normalize in 5-7 days Long-term BP goal normotensive  Hyperlipidemia Home meds:  atorvastatin '80mg'$  daily, resumed in  hospital LDL 109, goal < 70  High intensity statin not indicated  Continue statin at discharge  Diabetes type II Controlled (no diagnosis) Home meds:  none HgbA1c 5.5, goal < 7.0 CBGs Recent Labs    01/10/22 1313  GLUCAP 89     Other Stroke Risk Factors Cigarette smoker advised to stop smoking ETOH use, alcohol level <10, advised to drink no more than 2 drinks a day Substance abuse - UDS:  THC POSITIVE, Cocaine POSITIVE. Patient advised to stop using due to  stroke risk. Hx stroke Congestive heart failure  Other Active Problems  Hospital day # 1  Laurey Morale, MSN, NP-C [facility] See AMION or use Epic Chat  ATTENDING ATTESTATION:  61 year old known heart failure with MRI incompatible pacemaker.  We will repeat CT today to ensure there is no changes from yesterday's.  If unremarkable can continue Eliquis.  He has obvious risk factors including pack per day smoking.  Discussed tobacco cessation with him.  He is otherwise ready to go home.  His echo shows 30 to 35% EF which is his baseline.  Discussed with Dr.Gagan.  He can follow-up in stroke clinic at [facility].  Dr. Reeves Forth evaluated pt independently, reviewed imaging, chart, labs. Discussed and formulated plan with the Resident/APP. Changes were made to the note where appropriate. Please see APP/resident note above for details.    Alegandro Macnaughton,MD   To contact Stroke Continuity provider, please refer to [email or web address] After hours, contact General Neurology

## 2022-01-11 NOTE — TOC CAGE-AID Note (Signed)
Transition of Care [facility]) - CAGE-AID Screening   Patient Details  Name: Mike Bean MRN: [id number] Date of Birth: 07/01/1960  Transition [facility]) CM/SW Contact:    Pollie Friar, RN Phone Number: 01/11/2022, 11:22 AM   Clinical Narrative: Pt provided resources for inpatient/ outpatient drug and alcohol counseling. Patient currently declining any assistance in getting into an inpatient rehab. Family updated.   CAGE-AID Screening:    Have You Ever Felt You Ought to Cut Down on Your Drinking or Drug Use?: Yes Have People Annoyed You By Critizing Your Drinking Or Drug Use?: Yes Have You Felt Bad Or Guilty About Your Drinking Or Drug Use?: No Have You Ever Had a Drink or Used Drugs First Thing In The Morning to Steady Your Nerves or to Get Rid of a Hangover?: No CAGE-AID Score: 2  Substance Abuse Education Offered: Yes  Substance abuse interventions: Patient Counseling, Scientist, clinical (histocompatibility and immunogenetics)

## 2022-01-11 NOTE — Telephone Encounter (Signed)
Pt called back regarding note below.  Pt in hospital and told the providers there will consult cardiology if that is needed.  Pt also told the the hospital will arrange follow up visit with [facility] if that is indicated s/p hospital.  Pt was concerned about medication changes while he is admitted.  All questions were answered, and Pt verbalized understanding.  No follow up required at this time.

## 2022-01-11 NOTE — Progress Notes (Addendum)
PROGRESS NOTE        PATIENT DETAILS Name: Mike Bean Age: 61 y.o. Sex: male Date of Birth: February 24, 1961 Admit Date: 01/10/2022 Admitting Physician Evalee Mutton Kristeen Mans, MD OBS:[id number], Joelene Millin, NP  Brief Summary: 61 year old male with history of permanent atrial fibrillation, HFrEF, PPM placement-who presented to the hospital with left-sided numbness.  He was subsequently admitted to the hospitalist service.  See below for further details.  Significant events: 9/13>>Admit to [facility]-stroke like symp  Significant studies: 9/13>>CT head: no acute intracranial abnormality 9/13>>CT Angio head/neck: no LVO-no flow limiting stenosis 9/13>>A1C:5.5 9/13>>UDS: +ve cocaine/tetrahydrocannabinol 9/14>>CT head: no acute intracranial abnormality 9/14>>Echo: EF 35-40&-no shunt 9/14>>LDL:109  Significant microbiology data: 9/13>>covid/influenza HUT:[id number]  Procedures: None  Consults: Neuro Psych  Subjective: Ledt sided numbness better  Objective: Vitals: Blood pressure (!) 143/103, pulse 70, temperature (!) 97.3 F (36.3 C), temperature source Oral, resp. rate 16, height '5\' 10"'$  (1.778 m), weight 81.6 kg, SpO2 100 %.   Exam: Gen Exam:Alert awake-not in any distress HEENT:atraumatic, normocephalic Chest: B/L clear to auscultation anteriorly CVS:S1S2 regular Abdomen:soft non tender, non distended Extremities:no edema Neurology: Non focal Skin: no rash  Pertinent Labs/Radiology:    Latest Ref Rng & Units 01/10/2022    1:10 PM 01/26/2020    4:50 PM 06/09/2019    3:29 PM  CBC  WBC 4.0 - 10.5 K/uL 6.3  9.5  9.6   Hemoglobin 13.0 - 17.0 g/dL 16.3  17.1  17.9   Hematocrit 39.0 - 52.0 % 47.5  50.7  52.8   Platelets 150 - 400 K/uL 166  191  175     Lab Results  Component Value Date   NA 135 01/10/2022   K 4.2 01/10/2022   CL 107 01/10/2022   CO2 23 01/10/2022      Assessment/Plan: Acute CVA: either due to cocaine use or a small embolic CVA due to  missing several doses of eliquis recently (around 5-6 days). Unable to do MRI as pacer is incompatible.Eliquis/Statin resumed, work as above, await further neuro input.  Permanent atrial fibrillation:Paced rhythm-continue metoprolol-continue Eliquis. Note missed several doses of eliquis recently  Chronic HFrEF: Euvolemic-allow permissive HTN-hold entresto/farxiga  HLD: Continue Lipitor-he apparently has not been taking Lipitor prior to this hospitalization  EtOH use: Counseled extensively again.Anxious but not in withdrawal-monitor closely-ativan per CIWA.  Cocaine use: UDS positive for cocaine-acknowledges doing cocaine-have counseled extensively.   Idelle Crouch on 9/14-informed nursing staff that patient has been making homicidal/suicidal threats against him and patients brother. Spoke with nNcholos this afternoon, reportedly the threats were made yesterday. Some family dynamics in play-as son wants patient to go to drug rehab. Consulted psych MD, Dr Lovette Cliche -who reviewed chart, she felt there was number of red flags on review (agitation/refusal of PT/threatening to leave AMA)-since discharge was planned today (patient was threatening to leave AMA earlier per RN)-IVC was felt to be appropriate. Psych will see tomorrow am.Social worker consullted for IVC.Subsequently spoke with patient (with charge RN) who denied ever making such statement.  Will await psychiatric evaluation.  Patient aware that he is under involuntary commitment until psychiatric evaluation can be completed.  Daughter and other family member at bedside.    BMI: Estimated body mass index is 25.83 kg/m as calculated from the following:   Height as of this encounter: '5\' 10"'$  (1.778 m).   Weight as of this encounter:  81.6 kg.   Code status:   Code Status: Full Code   DVT Prophylaxis: apixaban (ELIQUIS) tablet 5 mg    Family Communication: Daughter/son in Sports coach earlier this morning at bedside, son-Nicholas over the phone  this afternoon    Disposition Plan: Status is: Observation The patient remains OBS appropriate and will d/c before 2 midnights.   Planned Discharge Destination:Home   Diet: Diet Order             Diet Heart Room service appropriate? Yes; Fluid consistency: Thin; Fluid restriction: 1200 mL Fluid  Diet effective now                     Antimicrobial agents: Anti-infectives (From admission, onward)    None        MEDICATIONS: Scheduled Meds:  apixaban  5 mg Oral BID   atorvastatin  80 mg Oral Daily   metoprolol succinate  50 mg Oral Daily   Continuous Infusions:  sodium chloride     PRN Meds:.acetaminophen **OR** acetaminophen (TYLENOL) oral liquid 160 mg/5 mL **OR** acetaminophen, albuterol, senna-docusate   I have personally reviewed following labs and imaging studies  LABORATORY DATA: CBC: Recent Labs  Lab 01/10/22 1310  WBC 6.3  NEUTROABS 4.2  HGB 16.3  HCT 47.5  MCV 92.6  PLT 485    Basic Metabolic Panel: Recent Labs  Lab 01/10/22 1310  NA 135  K 4.2  CL 107  CO2 23  GLUCOSE 100*  BUN 8  CREATININE 0.96  CALCIUM 8.5*    GFR: Estimated Creatinine Clearance: 81.3 mL/min (by C-G formula based on SCr of 0.96 mg/dL).  Liver Function Tests: Recent Labs  Lab 01/10/22 1310  AST 17  ALT 11  ALKPHOS 38  BILITOT 1.2  PROT 6.2*  ALBUMIN 3.4*   No results for input(s): "LIPASE", "AMYLASE" in the last 168 hours. No results for input(s): "AMMONIA" in the last 168 hours.  Coagulation Profile: Recent Labs  Lab 01/10/22 1310  INR 1.2    Cardiac Enzymes: No results for input(s): "CKTOTAL", "CKMB", "CKMBINDEX", "TROPONINI" in the last 168 hours.  BNP (last 3 results) No results for input(s): "PROBNP" in the last 8760 hours.  Lipid Profile: Recent Labs    01/11/22 0530  CHOL 155  HDL 34*  LDLCALC 109*  TRIG 62  CHOLHDL 4.6    Thyroid Function Tests: No results for input(s): "TSH", "T4TOTAL", "FREET4", "T3FREE",  "THYROIDAB" in the last 72 hours.  Anemia Panel: No results for input(s): "VITAMINB12", "FOLATE", "FERRITIN", "TIBC", "IRON", "RETICCTPCT" in the last 72 hours.  Urine analysis:    Component Value Date/Time   COLORURINE STRAW (A) 01/10/2022 1436   APPEARANCEUR CLEAR 01/10/2022 1436   LABSPEC 1.004 (L) 01/10/2022 1436   PHURINE 5.0 01/10/2022 1436   GLUCOSEU 150 (A) 01/10/2022 1436   HGBUR NEGATIVE 01/10/2022 1436   BILIRUBINUR NEGATIVE 01/10/2022 1436   KETONESUR NEGATIVE 01/10/2022 1436   PROTEINUR NEGATIVE 01/10/2022 1436   UROBILINOGEN 0.2 12/07/2009 1434   NITRITE NEGATIVE 01/10/2022 1436   LEUKOCYTESUR NEGATIVE 01/10/2022 1436    Sepsis Labs: Lactic Acid, Venous No results found for: "LATICACIDVEN"  MICROBIOLOGY: Recent Results (from the past 240 hour(s))  Resp Panel by RT-PCR (Flu A&B, Covid) Anterior Nasal Swab     Status: None   Collection Time: 01/10/22  2:33 PM   Specimen: Anterior Nasal Swab  Result Value Ref Range Status   SARS Coronavirus 2 by RT PCR NEGATIVE NEGATIVE Final    Comment: (  NOTE) SARS-CoV-2 target nucleic acids are NOT DETECTED.  The SARS-CoV-2 RNA is generally detectable in upper respiratory specimens during the acute phase of infection. The lowest concentration of SARS-CoV-2 viral copies this assay can detect is 138 copies/mL. A negative result does not preclude SARS-Cov-2 infection and should not be used as the sole basis for treatment or other patient management decisions. A negative result may occur with  improper specimen collection/handling, submission of specimen other than nasopharyngeal swab, presence of viral mutation(s) within the areas targeted by this assay, and inadequate number of viral copies(<138 copies/mL). A negative result must be combined with clinical observations, patient history, and epidemiological information. The expected result is Negative.  Fact Sheet for Patients:   EntrepreneurPulse.com.au  Fact Sheet for Healthcare Providers:  IncredibleEmployment.be  This test is no t yet approved or cleared by the [place] FDA and  has been authorized for detection and/or diagnosis of SARS-CoV-2 by FDA under an Emergency Use Authorization (EUA). This EUA will remain  in effect (meaning this test can be used) for the duration of the COVID-19 declaration under Section 564(b)(1) of the Act, 21 U.S.C.section 360bbb-3(b)(1), unless the authorization is terminated  or revoked sooner.       Influenza A by PCR NEGATIVE NEGATIVE Final   Influenza B by PCR NEGATIVE NEGATIVE Final    Comment: (NOTE) The Xpert Xpress SARS-CoV-2/FLU/RSV plus assay is intended as an aid in the diagnosis of influenza from Nasopharyngeal swab specimens and should not be used as a sole basis for treatment. Nasal washings and aspirates are unacceptable for Xpert Xpress SARS-CoV-2/FLU/RSV testing.  Fact Sheet for Patients: EntrepreneurPulse.com.au  Fact Sheet for Healthcare Providers: IncredibleEmployment.be  This test is not yet approved or cleared by the [place] FDA and has been authorized for detection and/or diagnosis of SARS-CoV-2 by FDA under an Emergency Use Authorization (EUA). This EUA will remain in effect (meaning this test can be used) for the duration of the COVID-19 declaration under Section 564(b)(1) of the Act, 21 U.S.C. section 360bbb-3(b)(1), unless the authorization is terminated or revoked.  Performed at [facility], [place] [address], [place], [place] [address]     RADIOLOGY STUDIES/RESULTS: ECHOCARDIOGRAM COMPLETE BUBBLE STUDY  Result Date: 01/11/2022    ECHOCARDIOGRAM REPORT   Patient Name:   ELIUD POLO Blanck Date of Exam: 01/11/2022 Medical Rec #:  [id number]    Height:       70.0 in Accession #:    [id number]   Weight:       180.0 lb Date of Birth:  05-14-1960    BSA:           1.996 m Patient Age:    29 years     BP:           126/102 mmHg Patient Gender: M            HR:           70 bpm. Exam Location:  Inpatient Procedure: 2D Echo, Cardiac Doppler, Color Doppler and Saline Contrast Bubble            Study Indications:    Stroke  History:        Patient has prior history of Echocardiogram examinations, most                 recent 09/12/2020. Cardiomyopathy and CHF, Pacemaker,                 Arrythmias:Atrial Fibrillation; Risk Factors:Dyslipidemia and  ETOH.  Sonographer:    Greer Pickerel Referring Phys: [place]  1. Left ventricular ejection fraction, by estimation, is 35 to 40%. Left ventricular ejection fraction by 2D MOD biplane is 35.6 %. The left ventricle has moderately decreased function. The left ventricle demonstrates global hypokinesis. The left ventricular internal cavity size was mildly dilated. Left ventricular diastolic function could not be evaluated.  2. Right ventricular systolic function is normal. The right ventricular size is mildly enlarged.  3. Left atrial size was moderately dilated.  4. Right atrial size was severely dilated.  5. The mitral valve is abnormal. Trivial mitral valve regurgitation.  6. The aortic valve is tricuspid. Aortic valve regurgitation is not visualized.  7. Aortic dilatation noted. There is mild dilatation of the aortic root, measuring 40 mm. There is borderline dilatation of the ascending aorta, measuring 38 mm.  8. The inferior vena cava is dilated in size with <50% respiratory variability, suggesting right atrial pressure of 15 mmHg.  9. Agitated saline contrast bubble study was negative, with no evidence of any interatrial shunt. Comparison(s): Changes from prior study are noted. 09/12/2020: LVEF 25-30%. FINDINGS  Left Ventricle: Left ventricular ejection fraction, by estimation, is 35 to 40%. Left ventricular ejection fraction by 2D MOD biplane is 35.6 %. The left ventricle has moderately  decreased function. The left ventricle demonstrates global hypokinesis. The left ventricular internal cavity size was mildly dilated. There is no left ventricular hypertrophy. Left ventricular diastolic function could not be evaluated due to atrial fibrillation. Left ventricular diastolic function could not be evaluated. Right Ventricle: The right ventricular size is mildly enlarged. No increase in right ventricular wall thickness. Right ventricular systolic function is normal. Left Atrium: Left atrial size was moderately dilated. Right Atrium: Right atrial size was severely dilated. Pericardium: There is no evidence of pericardial effusion. Mitral Valve: The mitral valve is abnormal. There is mild calcification of the anterior and posterior mitral valve leaflet(s). Mild mitral annular calcification. Trivial mitral valve regurgitation. Tricuspid Valve: The tricuspid valve is grossly normal. Tricuspid valve regurgitation is trivial. Aortic Valve: The aortic valve is tricuspid. Aortic valve regurgitation is not visualized. Pulmonic Valve: The pulmonic valve was grossly normal. Pulmonic valve regurgitation is trivial. Aorta: Aortic dilatation noted. There is mild dilatation of the aortic root, measuring 40 mm. There is borderline dilatation of the ascending aorta, measuring 38 mm. Venous: The inferior vena cava is dilated in size with less than 50% respiratory variability, suggesting right atrial pressure of 15 mmHg. IAS/Shunts: No atrial level shunt detected by color flow Doppler. Agitated saline contrast was given intravenously to evaluate for intracardiac shunting. Agitated saline contrast bubble study was negative, with no evidence of any interatrial shunt. Additional Comments: A device lead is visualized.  LEFT VENTRICLE PLAX 2D                        Biplane EF (MOD) LVIDd:         5.80 cm         LV Biplane EF:   Left LVIDs:         4.80 cm                          ventricular LV PW:         1.00 cm  ejection LV IVS:        0.70 cm                          fraction by LVOT diam:     2.20 cm                          2D MOD LV SV:         53                               biplane is LV SV Index:   27                               35.6 %. LVOT Area:     3.80 cm  LV Volumes (MOD) LV vol d, MOD    98.9 ml A2C: LV vol d, MOD    113.0 ml A4C: LV vol s, MOD    63.3 ml A2C: LV vol s, MOD    71.4 ml A4C: LV SV MOD A2C:   35.6 ml LV SV MOD A4C:   113.0 ml LV SV MOD BP:    37.7 ml RIGHT VENTRICLE             IVC RV S prime:     15.00 cm/s  IVC diam: 2.30 cm TAPSE (M-mode): 2.2 cm LEFT ATRIUM           Index        RIGHT ATRIUM           Index LA diam:      4.80 cm 2.40 cm/m   RA Area:     35.50 cm LA Vol (A2C): 67.3 ml 33.72 ml/m  RA Volume:   144.00 ml 72.15 ml/m LA Vol (A4C): 83.4 ml 41.79 ml/m  AORTIC VALVE LVOT Vmax:   53.80 cm/s LVOT Vmean:  52.000 cm/s LVOT VTI:    0.140 m  AORTA Ao Root diam: 4.00 cm Ao Asc diam:  3.80 cm  SHUNTS Systemic VTI:  0.14 m Systemic Diam: 2.20 cm Lyman Bishop MD Electronically signed by Lyman Bishop MD Signature Date/Time: 01/11/2022/4:07:12 PM    Final    CT HEAD WO CONTRAST (5MM)  Result Date: 01/11/2022 CLINICAL DATA:  Stroke follow-up EXAM: CT HEAD WITHOUT CONTRAST TECHNIQUE: Contiguous axial images were obtained from the base of the skull through the vertex without intravenous contrast. RADIATION DOSE REDUCTION: This exam was performed according to the departmental dose-optimization program which includes automated exposure control, adjustment of the mA and/or kV according to patient size and/or use of iterative reconstruction technique. COMPARISON:  None Available. FINDINGS: Brain: Old left parietal infarct. Brain parenchyma otherwise normal. No acute hemorrhage. Vascular: No abnormal hyperdensity of the major intracranial arteries or dural venous sinuses. No intracranial atherosclerosis. Skull: The visualized skull base, calvarium and extracranial soft tissues  are normal. Sinuses/Orbits: No fluid levels or advanced mucosal thickening of the visualized paranasal sinuses. No mastoid or middle ear effusion. The orbits are normal. IMPRESSION: 1. No acute intracranial abnormality. 2. Old left parietal infarct. Electronically Signed   By: Ulyses Jarred M.D.   On: 01/11/2022 03:51   CT ANGIO HEAD NECK W WO CM  Result Date: 01/10/2022 CLINICAL DATA:  Neuro deficit, acute, stroke suspected. Left facial numbness. EXAM: CT ANGIOGRAPHY HEAD  AND NECK TECHNIQUE: Multidetector CT imaging of the head and neck was performed using the standard protocol during bolus administration of intravenous contrast. Multiplanar CT image reconstructions and MIPs were obtained to evaluate the vascular anatomy. Carotid stenosis measurements (when applicable) are obtained utilizing NASCET criteria, using the distal internal carotid diameter as the denominator. RADIATION DOSE REDUCTION: This exam was performed according to the departmental dose-optimization program which includes automated exposure control, adjustment of the mA and/or kV according to patient size and/or use of iterative reconstruction technique. CONTRAST:  35m OMNIPAQUE IOHEXOL 350 MG/ML SOLN COMPARISON:  None Available. FINDINGS: CTA NECK FINDINGS Aortic arch: Standard 3 vessel aortic arch with a small to moderate amount of calcified plaque. No significant stenosis of the arch vessel origins. Right carotid system: Patent with a small amount of calcified plaque at the carotid bifurcation and mixed calcified and soft plaque at the distal aspect of the carotid bulb. No evidence of a significant stenosis or dissection. Left carotid system: Patent with a small amount of predominantly calcified plaque in the carotid bulb. No evidence of a significant stenosis or dissection. Vertebral arteries: Patent and codominant with calcified plaque at the vertebral origins resulting in moderate stenosis on the right and no significant stenosis on the  left. Skeleton: Cervicothoracic disc degeneration, focally severe at T1-2. Moderate cervical facet arthrosis. Other neck: No evidence of cervical lymphadenopathy or mass. Upper chest: Centrilobular and paraseptal emphysema. Review of the MIP images confirms the above findings CTA HEAD FINDINGS Anterior circulation: The internal carotid arteries are patent from skull base to carotid termini with mild atherosclerotic irregularity but no significant stenosis. ACAs and MCAs are patent without evidence of a proximal branch occlusion or significant proximal stenosis. The right ACA is dominant. No aneurysm is identified. Posterior circulation: The intracranial vertebral arteries are widely patent to the basilar. Patent PICA and SCA origins are seen bilaterally. The basilar artery is patent and irregular with mild stenosis distally. There are left larger than right posterior communicating arteries with hypoplasia of the left P1 segment. The PCAs are patent with mild branch vessel irregularity but no evidence of a significant proximal stenosis. No aneurysm is identified. Venous sinuses: Patent. Prominent arachnoid granulation in the posterior aspect of the superior sagittal sinus. Anatomic variants: Predominantly fetal type origin of the left PCA. Dominant right ACA. Review of the MIP images confirms the above findings IMPRESSION: 1. No large vessel occlusion. 2. Mild intracranial atherosclerosis without flow limiting proximal stenosis. 3. Moderate right vertebral artery origin stenosis. 4. Cervical carotid artery atherosclerosis without significant stenosis. 5. Aortic Atherosclerosis (ICD10-I70.0) and Emphysema (ICD10-J43.9). Electronically Signed   By: ALogan BoresM.D.   On: 01/10/2022 17:29   CT HEAD CODE STROKE WO CONTRAST  Result Date: 01/10/2022 CLINICAL DATA:  Code stroke. Neuro deficit, acute, stroke suspected. Left facial numbness. EXAM: CT HEAD WITHOUT CONTRAST TECHNIQUE: Contiguous axial images were obtained  from the base of the skull through the vertex without intravenous contrast. RADIATION DOSE REDUCTION: This exam was performed according to the departmental dose-optimization program which includes automated exposure control, adjustment of the mA and/or kV according to patient size and/or use of iterative reconstruction technique. COMPARISON:  Head CT 03/20/2021 FINDINGS: Brain: There is no evidence of an acute cortically based infarct, intracranial hemorrhage, mass, midline shift, or extra-axial fluid collection. A 4 mm hypodensity in the right thalamus was not clearly present on the prior CT and may reflect an age-indeterminate lacunar infarct or artifact. A chronic left parietal infarct is unchanged.  Hypodensities elsewhere in the cerebral white matter bilaterally are nonspecific but compatible with mild chronic small vessel ischemic disease. The ventricles are normal in size. Vascular: Calcified atherosclerosis at the skull base. No hyperdense vessel. Skull: No fracture or suspicious osseous lesion. Sinuses/Orbits: Minimal mucosal thickening in the paranasal sinuses. Clear mastoid air cells. Unremarkable orbits. Other: None. ASPECTS [facility] Stroke Program Early CT Score) - Ganglionic level infarction (caudate, lentiform nuclei, internal capsule, insula, M1-M3 cortex): 7 - Supraganglionic infarction (M4-M6 cortex): 3 Total score (0-10 with 10 being normal): 10 IMPRESSION: 1. No evidence of an acute cortically based infarct or intracranial hemorrhage. ASPECTS of 10. 2. Possible age-indeterminate lacunar infarct in the right thalamus. 3. Chronic left parietal infarct. These results were communicated to Dr. Quinn Axe at 1:25 pm on 01/10/2022 by text page via the [facility] messaging system. Electronically Signed   By: Logan Bores M.D.   On: 01/10/2022 13:26     LOS: 1 day   Oren Binet, MD  [facility]    To contact the attending provider between 7A-7P or the covering provider during after hours 7P-7A,  please log into the web site [email or web address] and access using universal [place] password for that web site. If you do not have the password, please call the hospital operator.  01/11/2022, 4:24 PM

## 2022-01-11 NOTE — Progress Notes (Addendum)
PT Cancellation Note  Patient Details Name: Mike Bean MRN: [id number] DOB: 1960/05/15   Cancelled Treatment:    Reason Eval/Treat Not Completed: Fatigue/lethargy limiting ability to participate. Pt sleeping very soundly with multiple family members also sleeping in the room. No one roused to voice and room full. Will check back later to initiate PT eval.    Thelma Comp 01/11/2022, 8:53 AM  Addendum: 0973 pt still asleep. RN reports pt received Ativan but family still asleep in the room as well. Will continue to follow.   Rolinda Roan, PT, DPT Acute Rehabilitation Services Secure Chat Preferred Office: [phone]

## 2022-01-11 NOTE — Progress Notes (Signed)
Secured message sent to Dr Hal Hope about the concern of the pt home meds, pt asking for all his home meds d to be given to him now and asking for nicotine patch, informed pt, awaiting for doctor response

## 2022-01-11 NOTE — Discharge Summary (Signed)
PATIENT DETAILS Name: Mike Bean Age: 61 y.o. Sex: male Date of Birth: 1960/11/19 MRN: [id number]. Admitting Physician: Jonetta Osgood, MD SWN:[id number], Joelene Millin, NP  Admit Date: 01/10/2022 Discharge date: 01/12/2022  Recommendations for Outpatient Follow-up:  Follow up with PCP in 1-2 weeks Please obtain CMP/CBC in one week Please ensure follow up with Neurology and Cardiology Continue counseling regarding importance of complicance to meds (missed several doses of Eliquis) Continue counseling regarding cessation of ETOH and cocaine use  Admitted From:  Home  Disposition: Home   Discharge Condition: fair  CODE STATUS:   Code Status: Full Code   Diet recommendation:  Diet Order             Diet - low sodium heart healthy           Diet Heart Room service appropriate? Yes; Fluid consistency: Thin; Fluid restriction: 1200 mL Fluid  Diet effective now                    Brief Summary: 61 year old male with history of permanent atrial fibrillation, HFrEF, PPM placement-who presented to the hospital with left-sided numbness.  He was subsequently admitted to the hospitalist service.  See below for further details.  Brief Hospital Course: Acute CVA: Presumed to have acute CVA given MRI unable to be done due to incompatible PPM.  CT head x2 negative.  Patient acknowledges that he missed several days of Eliquis prior to this hospitalization.  He also acknowledges cocaine use.  Echo continues to show suppressed EF.  Paced rhythm on telemetry.  Compliance to Eliquis emphasized/counseled.  Extensive counseling regarding importance of stopping cocaine done as well.  No further recommendations from neurology-stable for discharge.    Permanent atrial fibrillation: Paced rhythm-continue metoprolol-continue Eliquis.  Extensive counseling done regarding importance of being compliant with Eliquis.  As noted above-he acknowledges that he missed around 5-6 days of Eliquis before  this hospitalization.  HLD: Continue Lipitor-he apparently has not been taking Lipitor prior to this hospitalization.  Counseling done regarding importance of continuing to take Lipitor as prescribed.  Chronic HFrEF: Euvolemic-we will resume his usual outpatient medications.Please see below  Cocaine use: UDS positive for cocaine-acknowledges doing cocaine-have counseled extensively.  He is aware that cocaine and metoprolol do not go together-and that he is at risk of significant mortality/morbidity including uncontrolled hypertension.  He claims that he wants to continue metoprolol and will no longer do cocaine. He is not keen on any medication changes while inpatient-he has been repeatedly asking for his "home meds" while in the hospital. He also has been saying that he will get in touch with his cardiologist-Dr Lovena Le as well.  He will need continued close follow-up with his outpatient physicians.  EtOH use: Counseled extensively again.  ?  Homicidal/suicidal ideation: On 9/14 when discharge plans were being made-nursing staff received a call from patient's son Hart Carwin saying patient had made homicidal threats against Hart Carwin and the patient's brother.  Patient denied ever making such threats (daughter and another family member at bedside on 9/14 confirmed that patient never made such threats).  However after discussion with psychiatry-given agitation/refusal of PT-threatening to leave AMA-drug use-it was felt that patient needed a formal psychiatric evaluation before consideration of safe discharge.  Hence patient was IVC'd.  Psychiatry evaluation was subsequently performed on 9/15-psych team obtain collateral from patient's daughter.  Patient was subsequently cleared to be discharged-recommendations were to resend the involuntary commitment paperwork.      Please note-extensive counseling  regarding drug use was done front of son-in-law/daughter-RN present.  Prior to discussing drug use-consent  was obtained from patient to see if he would want this discussed with his family.    Discharge Diagnoses:  Principal Problem:   Acute CVA (cerebrovascular accident) [facility]) Active Problems:   ATRIAL FIBRILLATION   Congestive heart failure ([place])   Pacemaker   CVA (cerebral vascular accident) [facility])   Discharge Instructions:  Activity:  As tolerated with Full fall precautions use walker/cane & assistance as needed   Discharge Instructions     Ambulatory referral to Neurology   Complete by: As directed    An appointment is requested in approximately: 8 weeks   Diet - low sodium heart healthy   Complete by: As directed    Discharge instructions   Complete by: As directed    Follow with Primary MD  Everardo Beals, NP in 1-2 weeks  Follow with the stroke clinic, you will receive a call from them  STOP COCAINE, SMOKING AND ALCOHOL USE!!  Please get a complete blood count and chemistry panel checked by your Primary MD at your next visit, and again as instructed by your Primary MD.  Get Medicines reviewed and adjusted: Please take all your medications with you for your next visit with your Primary MD  Laboratory/radiological data: Please request your Primary MD to go over all hospital tests and procedure/radiological results at the follow up, please ask your Primary MD to get all Hospital records sent to his/her office.  In some cases, they will be blood work, cultures and biopsy results pending at the time of your discharge. Please request that your primary care M.D. follows up on these results.  Also Note the following: If you experience worsening of your admission symptoms, develop shortness of breath, life threatening emergency, suicidal or homicidal thoughts you must seek medical attention immediately by calling 911 or calling your MD immediately  if symptoms less severe.  You must read complete instructions/literature along with all the possible adverse reactions/side  effects for all the Medicines you take and that have been prescribed to you. Take any new Medicines after you have completely understood and accpet all the possible adverse reactions/side effects.   Do not drive when taking Pain medications or sleeping medications (Benzodaizepines)  Do not take more than prescribed Pain, Sleep and Anxiety Medications. It is not advisable to combine anxiety,sleep and pain medications without talking with your primary care practitioner  Special Instructions: If you have smoked or chewed Tobacco  in the last 2 yrs please stop smoking, stop any regular Alcohol  and or any Recreational drug use.  Wear Seat belts while driving.  Please note: You were cared for by a hospitalist during your hospital stay. Once you are discharged, your primary care physician will handle any further medical issues. Please note that NO REFILLS for any discharge medications will be authorized once you are discharged, as it is imperative that you return to your primary care physician (or establish a relationship with a primary care physician if you do not have one) for your post hospital discharge needs so that they can reassess your need for medications and monitor your lab values.   Increase activity slowly   Complete by: As directed       Allergies as of 01/12/2022   No Known Allergies      Medication List     TAKE these medications    albuterol 108 (90 Base) MCG/ACT inhaler Commonly known as: VENTOLIN  HFA Inhale 2 puffs into the lungs every 6 (six) hours as needed for wheezing or shortness of breath.   albuterol (2.5 MG/3ML) 0.083% nebulizer solution Commonly known as: PROVENTIL Take 3 mLs (2.5 mg total) by nebulization every 6 (six) hours as needed for wheezing or shortness of breath.   atorvastatin 80 MG tablet Commonly known as: Lipitor Take 1 tablet (80 mg total) by mouth daily.   cetirizine 10 MG chewable tablet Commonly known as: ZYRTEC Chew 10 mg by mouth  daily.   Co Q 10 100 MG Caps Take 100 ng by mouth daily.   cyanocobalamin 500 MCG tablet Commonly known as: VITAMIN B12 Take 1,000 mcg by mouth daily.   dapagliflozin propanediol 10 MG Tabs tablet Commonly known as: Farxiga Take 1 tablet (10 mg total) by mouth daily before breakfast.   Eliquis 5 MG Tabs tablet Generic drug: apixaban TAKE ONE TABLET BY MOUTH TWICE A DAY   Entresto 97-103 MG Generic drug: sacubitril-valsartan Take 1 tablet by mouth 2 (two) times daily.   Fish Oil Concentrate 300 MG Caps Take 300 mg by mouth daily.   furosemide 40 MG tablet Commonly known as: LASIX Take 1 tablet (40 mg total) by mouth daily as needed (Swelling).   ibuprofen 600 MG tablet Commonly known as: ADVIL Take 600 mg by mouth 3 (three) times daily.   metoprolol succinate 50 MG 24 hr tablet Commonly known as: Toprol XL Take 1 tablet (50 mg total) by mouth daily.   vitamin C 1000 MG tablet Take 1,000 mg by mouth daily.        Follow-up Information     Everardo Beals, NP. Schedule an appointment as soon as possible for a visit in 1 week(s).   Contact information: [place] [address] [phone]         [facility] Follow up.   Specialty: Neurology Why: Office will call with date/time, If you dont hear from them,please give them a call Contact information: [address] [place]        Evans Lance, MD. Schedule an appointment as soon as possible for a visit in 2 week(s).   Specialty: Cardiology Contact information: [address] [place] [address] [phone]                No Known Allergies   Other Procedures/Studies: ECHOCARDIOGRAM COMPLETE BUBBLE STUDY  Result Date: 01/11/2022    ECHOCARDIOGRAM REPORT   Patient Name:   Mike Bean Date of Exam: 01/11/2022 Medical Rec #:  [id number]    Height:       70.0 in Accession #:     [id number]   Weight:       180.0 lb Date of Birth:  01-31-1961    BSA:          1.996 m Patient Age:    10 years     BP:           126/102 mmHg Patient Gender: M            HR:           70 bpm. Exam Location:  Inpatient Procedure: 2D Echo, Cardiac Doppler, Color Doppler and Saline Contrast Bubble            Study Indications:    Stroke  History:        Patient has prior history of Echocardiogram examinations, most  recent 09/12/2020. Cardiomyopathy and CHF, Pacemaker,                 Arrythmias:Atrial Fibrillation; Risk Factors:Dyslipidemia and                 ETOH.  Sonographer:    Greer Pickerel Referring Phys: [place]  1. Left ventricular ejection fraction, by estimation, is 35 to 40%. Left ventricular ejection fraction by 2D MOD biplane is 35.6 %. The left ventricle has moderately decreased function. The left ventricle demonstrates global hypokinesis. The left ventricular internal cavity size was mildly dilated. Left ventricular diastolic function could not be evaluated.  2. Right ventricular systolic function is normal. The right ventricular size is mildly enlarged.  3. Left atrial size was moderately dilated.  4. Right atrial size was severely dilated.  5. The mitral valve is abnormal. Trivial mitral valve regurgitation.  6. The aortic valve is tricuspid. Aortic valve regurgitation is not visualized.  7. Aortic dilatation noted. There is mild dilatation of the aortic root, measuring 40 mm. There is borderline dilatation of the ascending aorta, measuring 38 mm.  8. The inferior vena cava is dilated in size with <50% respiratory variability, suggesting right atrial pressure of 15 mmHg.  9. Agitated saline contrast bubble study was negative, with no evidence of any interatrial shunt. Comparison(s): Changes from prior study are noted. 09/12/2020: LVEF 25-30%. FINDINGS  Left Ventricle: Left ventricular ejection fraction, by estimation, is 35 to 40%. Left ventricular ejection  fraction by 2D MOD biplane is 35.6 %. The left ventricle has moderately decreased function. The left ventricle demonstrates global hypokinesis. The left ventricular internal cavity size was mildly dilated. There is no left ventricular hypertrophy. Left ventricular diastolic function could not be evaluated due to atrial fibrillation. Left ventricular diastolic function could not be evaluated. Right Ventricle: The right ventricular size is mildly enlarged. No increase in right ventricular wall thickness. Right ventricular systolic function is normal. Left Atrium: Left atrial size was moderately dilated. Right Atrium: Right atrial size was severely dilated. Pericardium: There is no evidence of pericardial effusion. Mitral Valve: The mitral valve is abnormal. There is mild calcification of the anterior and posterior mitral valve leaflet(s). Mild mitral annular calcification. Trivial mitral valve regurgitation. Tricuspid Valve: The tricuspid valve is grossly normal. Tricuspid valve regurgitation is trivial. Aortic Valve: The aortic valve is tricuspid. Aortic valve regurgitation is not visualized. Pulmonic Valve: The pulmonic valve was grossly normal. Pulmonic valve regurgitation is trivial. Aorta: Aortic dilatation noted. There is mild dilatation of the aortic root, measuring 40 mm. There is borderline dilatation of the ascending aorta, measuring 38 mm. Venous: The inferior vena cava is dilated in size with less than 50% respiratory variability, suggesting right atrial pressure of 15 mmHg. IAS/Shunts: No atrial level shunt detected by color flow Doppler. Agitated saline contrast was given intravenously to evaluate for intracardiac shunting. Agitated saline contrast bubble study was negative, with no evidence of any interatrial shunt. Additional Comments: A device lead is visualized.  LEFT VENTRICLE PLAX 2D                        Biplane EF (MOD) LVIDd:         5.80 cm         LV Biplane EF:   Left LVIDs:         4.80 cm  ventricular LV PW:         1.00 cm                          ejection LV IVS:        0.70 cm                          fraction by LVOT diam:     2.20 cm                          2D MOD LV SV:         53                               biplane is LV SV Index:   27                               35.6 %. LVOT Area:     3.80 cm  LV Volumes (MOD) LV vol d, MOD    98.9 ml A2C: LV vol d, MOD    113.0 ml A4C: LV vol s, MOD    63.3 ml A2C: LV vol s, MOD    71.4 ml A4C: LV SV MOD A2C:   35.6 ml LV SV MOD A4C:   113.0 ml LV SV MOD BP:    37.7 ml RIGHT VENTRICLE             IVC RV S prime:     15.00 cm/s  IVC diam: 2.30 cm TAPSE (M-mode): 2.2 cm LEFT ATRIUM           Index        RIGHT ATRIUM           Index LA diam:      4.80 cm 2.40 cm/m   RA Area:     35.50 cm LA Vol (A2C): 67.3 ml 33.72 ml/m  RA Volume:   144.00 ml 72.15 ml/m LA Vol (A4C): 83.4 ml 41.79 ml/m  AORTIC VALVE LVOT Vmax:   53.80 cm/s LVOT Vmean:  52.000 cm/s LVOT VTI:    0.140 m  AORTA Ao Root diam: 4.00 cm Ao Asc diam:  3.80 cm  SHUNTS Systemic VTI:  0.14 m Systemic Diam: 2.20 cm Lyman Bishop MD Electronically signed by Lyman Bishop MD Signature Date/Time: 01/11/2022/4:07:12 PM    Final    CT HEAD WO CONTRAST (5MM)  Result Date: 01/11/2022 CLINICAL DATA:  Stroke follow-up EXAM: CT HEAD WITHOUT CONTRAST TECHNIQUE: Contiguous axial images were obtained from the base of the skull through the vertex without intravenous contrast. RADIATION DOSE REDUCTION: This exam was performed according to the departmental dose-optimization program which includes automated exposure control, adjustment of the mA and/or kV according to patient size and/or use of iterative reconstruction technique. COMPARISON:  None Available. FINDINGS: Brain: Old left parietal infarct. Brain parenchyma otherwise normal. No acute hemorrhage. Vascular: No abnormal hyperdensity of the major intracranial arteries or dural venous sinuses. No intracranial atherosclerosis.  Skull: The visualized skull base, calvarium and extracranial soft tissues are normal. Sinuses/Orbits: No fluid levels or advanced mucosal thickening of the visualized paranasal sinuses. No mastoid or middle ear effusion. The orbits are normal. IMPRESSION: 1. No acute intracranial abnormality. 2. Old left parietal infarct. Electronically Signed  By: Ulyses Jarred M.D.   On: 01/11/2022 03:51   CT ANGIO HEAD NECK W WO CM  Result Date: 01/10/2022 CLINICAL DATA:  Neuro deficit, acute, stroke suspected. Left facial numbness. EXAM: CT ANGIOGRAPHY HEAD AND NECK TECHNIQUE: Multidetector CT imaging of the head and neck was performed using the standard protocol during bolus administration of intravenous contrast. Multiplanar CT image reconstructions and MIPs were obtained to evaluate the vascular anatomy. Carotid stenosis measurements (when applicable) are obtained utilizing NASCET criteria, using the distal internal carotid diameter as the denominator. RADIATION DOSE REDUCTION: This exam was performed according to the departmental dose-optimization program which includes automated exposure control, adjustment of the mA and/or kV according to patient size and/or use of iterative reconstruction technique. CONTRAST:  72m OMNIPAQUE IOHEXOL 350 MG/ML SOLN COMPARISON:  None Available. FINDINGS: CTA NECK FINDINGS Aortic arch: Standard 3 vessel aortic arch with a small to moderate amount of calcified plaque. No significant stenosis of the arch vessel origins. Right carotid system: Patent with a small amount of calcified plaque at the carotid bifurcation and mixed calcified and soft plaque at the distal aspect of the carotid bulb. No evidence of a significant stenosis or dissection. Left carotid system: Patent with a small amount of predominantly calcified plaque in the carotid bulb. No evidence of a significant stenosis or dissection. Vertebral arteries: Patent and codominant with calcified plaque at the vertebral origins  resulting in moderate stenosis on the right and no significant stenosis on the left. Skeleton: Cervicothoracic disc degeneration, focally severe at T1-2. Moderate cervical facet arthrosis. Other neck: No evidence of cervical lymphadenopathy or mass. Upper chest: Centrilobular and paraseptal emphysema. Review of the MIP images confirms the above findings CTA HEAD FINDINGS Anterior circulation: The internal carotid arteries are patent from skull base to carotid termini with mild atherosclerotic irregularity but no significant stenosis. ACAs and MCAs are patent without evidence of a proximal branch occlusion or significant proximal stenosis. The right ACA is dominant. No aneurysm is identified. Posterior circulation: The intracranial vertebral arteries are widely patent to the basilar. Patent PICA and SCA origins are seen bilaterally. The basilar artery is patent and irregular with mild stenosis distally. There are left larger than right posterior communicating arteries with hypoplasia of the left P1 segment. The PCAs are patent with mild branch vessel irregularity but no evidence of a significant proximal stenosis. No aneurysm is identified. Venous sinuses: Patent. Prominent arachnoid granulation in the posterior aspect of the superior sagittal sinus. Anatomic variants: Predominantly fetal type origin of the left PCA. Dominant right ACA. Review of the MIP images confirms the above findings IMPRESSION: 1. No large vessel occlusion. 2. Mild intracranial atherosclerosis without flow limiting proximal stenosis. 3. Moderate right vertebral artery origin stenosis. 4. Cervical carotid artery atherosclerosis without significant stenosis. 5. Aortic Atherosclerosis (ICD10-I70.0) and Emphysema (ICD10-J43.9). Electronically Signed   By: ALogan BoresM.D.   On: 01/10/2022 17:29   CT HEAD CODE STROKE WO CONTRAST  Result Date: 01/10/2022 CLINICAL DATA:  Code stroke. Neuro deficit, acute, stroke suspected. Left facial numbness.  EXAM: CT HEAD WITHOUT CONTRAST TECHNIQUE: Contiguous axial images were obtained from the base of the skull through the vertex without intravenous contrast. RADIATION DOSE REDUCTION: This exam was performed according to the departmental dose-optimization program which includes automated exposure control, adjustment of the mA and/or kV according to patient size and/or use of iterative reconstruction technique. COMPARISON:  Head CT 03/20/2021 FINDINGS: Brain: There is no evidence of an acute cortically based infarct, intracranial hemorrhage, mass,  midline shift, or extra-axial fluid collection. A 4 mm hypodensity in the right thalamus was not clearly present on the prior CT and may reflect an age-indeterminate lacunar infarct or artifact. A chronic left parietal infarct is unchanged. Hypodensities elsewhere in the cerebral white matter bilaterally are nonspecific but compatible with mild chronic small vessel ischemic disease. The ventricles are normal in size. Vascular: Calcified atherosclerosis at the skull base. No hyperdense vessel. Skull: No fracture or suspicious osseous lesion. Sinuses/Orbits: Minimal mucosal thickening in the paranasal sinuses. Clear mastoid air cells. Unremarkable orbits. Other: None. ASPECTS [facility] Stroke Program Early CT Score) - Ganglionic level infarction (caudate, lentiform nuclei, internal capsule, insula, M1-M3 cortex): 7 - Supraganglionic infarction (M4-M6 cortex): 3 Total score (0-10 with 10 being normal): 10 IMPRESSION: 1. No evidence of an acute cortically based infarct or intracranial hemorrhage. ASPECTS of 10. 2. Possible age-indeterminate lacunar infarct in the right thalamus. 3. Chronic left parietal infarct. These results were communicated to Dr. Quinn Axe at 1:25 pm on 01/10/2022 by text page via the [facility] messaging system. Electronically Signed   By: Logan Bores M.D.   On: 01/10/2022 13:26   CUP PACEART REMOTE DEVICE CHECK  Result Date: 01/03/2022 Scheduled remote reviewed.  Normal device function.  Permanent AF, s/p AV node ablation, Eliquis Next remote 91 days. LA    TODAY-DAY OF DISCHARGE:  Subjective:   Mike Bean today has no headache,no chest abdominal pain,no new weakness tingling or numbness, feels much better wants to go home today.  Objective:   Blood pressure (!) 132/93, pulse 74, temperature (!) 97.5 F (36.4 C), temperature source Oral, resp. rate 17, height '5\' 10"'$  (1.778 m), weight 81.6 kg, SpO2 100 %.  Intake/Output Summary (Last 24 hours) at 01/12/2022 1611 Last data filed at 01/12/2022 1300 Gross per 24 hour  Intake 940 ml  Output 400 ml  Net 540 ml   Filed Weights   01/10/22 1229  Weight: 81.6 kg    Exam: Awake Alert, Oriented *3, No new F.N deficits, Normal affect [place].AT,PERRAL Supple Neck,No JVD, No cervical lymphadenopathy appriciated.  Symmetrical Chest wall movement, Good air movement bilaterally, CTAB RRR,No Gallops,Rubs or new Murmurs, No Parasternal Heave +ve B.Sounds, Abd Soft, Non tender, No organomegaly appriciated, No rebound -guarding or rigidity. No Cyanosis, Clubbing or edema, No new Rash or bruise   PERTINENT RADIOLOGIC STUDIES: ECHOCARDIOGRAM COMPLETE BUBBLE STUDY  Result Date: 01/11/2022    ECHOCARDIOGRAM REPORT   Patient Name:   Mike Bean Date of Exam: 01/11/2022 Medical Rec #:  [id number]    Height:       70.0 in Accession #:    [id number]   Weight:       180.0 lb Date of Birth:  1960/08/10    BSA:          1.996 m Patient Age:    14 years     BP:           126/102 mmHg Patient Gender: M            HR:           70 bpm. Exam Location:  Inpatient Procedure: 2D Echo, Cardiac Doppler, Color Doppler and Saline Contrast Bubble            Study Indications:    Stroke  History:        Patient has prior history of Echocardiogram examinations, most                 recent 09/12/2020.  Cardiomyopathy and CHF, Pacemaker,                 Arrythmias:Atrial Fibrillation; Risk Factors:Dyslipidemia and                 ETOH.   Sonographer:    Greer Pickerel Referring Phys: [place]  1. Left ventricular ejection fraction, by estimation, is 35 to 40%. Left ventricular ejection fraction by 2D MOD biplane is 35.6 %. The left ventricle has moderately decreased function. The left ventricle demonstrates global hypokinesis. The left ventricular internal cavity size was mildly dilated. Left ventricular diastolic function could not be evaluated.  2. Right ventricular systolic function is normal. The right ventricular size is mildly enlarged.  3. Left atrial size was moderately dilated.  4. Right atrial size was severely dilated.  5. The mitral valve is abnormal. Trivial mitral valve regurgitation.  6. The aortic valve is tricuspid. Aortic valve regurgitation is not visualized.  7. Aortic dilatation noted. There is mild dilatation of the aortic root, measuring 40 mm. There is borderline dilatation of the ascending aorta, measuring 38 mm.  8. The inferior vena cava is dilated in size with <50% respiratory variability, suggesting right atrial pressure of 15 mmHg.  9. Agitated saline contrast bubble study was negative, with no evidence of any interatrial shunt. Comparison(s): Changes from prior study are noted. 09/12/2020: LVEF 25-30%. FINDINGS  Left Ventricle: Left ventricular ejection fraction, by estimation, is 35 to 40%. Left ventricular ejection fraction by 2D MOD biplane is 35.6 %. The left ventricle has moderately decreased function. The left ventricle demonstrates global hypokinesis. The left ventricular internal cavity size was mildly dilated. There is no left ventricular hypertrophy. Left ventricular diastolic function could not be evaluated due to atrial fibrillation. Left ventricular diastolic function could not be evaluated. Right Ventricle: The right ventricular size is mildly enlarged. No increase in right ventricular wall thickness. Right ventricular systolic function is normal. Left Atrium: Left atrial size was  moderately dilated. Right Atrium: Right atrial size was severely dilated. Pericardium: There is no evidence of pericardial effusion. Mitral Valve: The mitral valve is abnormal. There is mild calcification of the anterior and posterior mitral valve leaflet(s). Mild mitral annular calcification. Trivial mitral valve regurgitation. Tricuspid Valve: The tricuspid valve is grossly normal. Tricuspid valve regurgitation is trivial. Aortic Valve: The aortic valve is tricuspid. Aortic valve regurgitation is not visualized. Pulmonic Valve: The pulmonic valve was grossly normal. Pulmonic valve regurgitation is trivial. Aorta: Aortic dilatation noted. There is mild dilatation of the aortic root, measuring 40 mm. There is borderline dilatation of the ascending aorta, measuring 38 mm. Venous: The inferior vena cava is dilated in size with less than 50% respiratory variability, suggesting right atrial pressure of 15 mmHg. IAS/Shunts: No atrial level shunt detected by color flow Doppler. Agitated saline contrast was given intravenously to evaluate for intracardiac shunting. Agitated saline contrast bubble study was negative, with no evidence of any interatrial shunt. Additional Comments: A device lead is visualized.  LEFT VENTRICLE PLAX 2D                        Biplane EF (MOD) LVIDd:         5.80 cm         LV Biplane EF:   Left LVIDs:         4.80 cm  ventricular LV PW:         1.00 cm                          ejection LV IVS:        0.70 cm                          fraction by LVOT diam:     2.20 cm                          2D MOD LV SV:         53                               biplane is LV SV Index:   27                               35.6 %. LVOT Area:     3.80 cm  LV Volumes (MOD) LV vol d, MOD    98.9 ml A2C: LV vol d, MOD    113.0 ml A4C: LV vol s, MOD    63.3 ml A2C: LV vol s, MOD    71.4 ml A4C: LV SV MOD A2C:   35.6 ml LV SV MOD A4C:   113.0 ml LV SV MOD BP:    37.7 ml RIGHT VENTRICLE              IVC RV S prime:     15.00 cm/s  IVC diam: 2.30 cm TAPSE (M-mode): 2.2 cm LEFT ATRIUM           Index        RIGHT ATRIUM           Index LA diam:      4.80 cm 2.40 cm/m   RA Area:     35.50 cm LA Vol (A2C): 67.3 ml 33.72 ml/m  RA Volume:   144.00 ml 72.15 ml/m LA Vol (A4C): 83.4 ml 41.79 ml/m  AORTIC VALVE LVOT Vmax:   53.80 cm/s LVOT Vmean:  52.000 cm/s LVOT VTI:    0.140 m  AORTA Ao Root diam: 4.00 cm Ao Asc diam:  3.80 cm  SHUNTS Systemic VTI:  0.14 m Systemic Diam: 2.20 cm Lyman Bishop MD Electronically signed by Lyman Bishop MD Signature Date/Time: 01/11/2022/4:07:12 PM    Final    CT HEAD WO CONTRAST (5MM)  Result Date: 01/11/2022 CLINICAL DATA:  Stroke follow-up EXAM: CT HEAD WITHOUT CONTRAST TECHNIQUE: Contiguous axial images were obtained from the base of the skull through the vertex without intravenous contrast. RADIATION DOSE REDUCTION: This exam was performed according to the departmental dose-optimization program which includes automated exposure control, adjustment of the mA and/or kV according to patient size and/or use of iterative reconstruction technique. COMPARISON:  None Available. FINDINGS: Brain: Old left parietal infarct. Brain parenchyma otherwise normal. No acute hemorrhage. Vascular: No abnormal hyperdensity of the major intracranial arteries or dural venous sinuses. No intracranial atherosclerosis. Skull: The visualized skull base, calvarium and extracranial soft tissues are normal. Sinuses/Orbits: No fluid levels or advanced mucosal thickening of the visualized paranasal sinuses. No mastoid or middle ear effusion. The orbits are normal. IMPRESSION: 1. No acute intracranial abnormality. 2. Old left parietal infarct. Electronically Signed  By: Ulyses Jarred M.D.   On: 01/11/2022 03:51   CT ANGIO HEAD NECK W WO CM  Result Date: 01/10/2022 CLINICAL DATA:  Neuro deficit, acute, stroke suspected. Left facial numbness. EXAM: CT ANGIOGRAPHY HEAD AND NECK TECHNIQUE: Multidetector  CT imaging of the head and neck was performed using the standard protocol during bolus administration of intravenous contrast. Multiplanar CT image reconstructions and MIPs were obtained to evaluate the vascular anatomy. Carotid stenosis measurements (when applicable) are obtained utilizing NASCET criteria, using the distal internal carotid diameter as the denominator. RADIATION DOSE REDUCTION: This exam was performed according to the departmental dose-optimization program which includes automated exposure control, adjustment of the mA and/or kV according to patient size and/or use of iterative reconstruction technique. CONTRAST:  80m OMNIPAQUE IOHEXOL 350 MG/ML SOLN COMPARISON:  None Available. FINDINGS: CTA NECK FINDINGS Aortic arch: Standard 3 vessel aortic arch with a small to moderate amount of calcified plaque. No significant stenosis of the arch vessel origins. Right carotid system: Patent with a small amount of calcified plaque at the carotid bifurcation and mixed calcified and soft plaque at the distal aspect of the carotid bulb. No evidence of a significant stenosis or dissection. Left carotid system: Patent with a small amount of predominantly calcified plaque in the carotid bulb. No evidence of a significant stenosis or dissection. Vertebral arteries: Patent and codominant with calcified plaque at the vertebral origins resulting in moderate stenosis on the right and no significant stenosis on the left. Skeleton: Cervicothoracic disc degeneration, focally severe at T1-2. Moderate cervical facet arthrosis. Other neck: No evidence of cervical lymphadenopathy or mass. Upper chest: Centrilobular and paraseptal emphysema. Review of the MIP images confirms the above findings CTA HEAD FINDINGS Anterior circulation: The internal carotid arteries are patent from skull base to carotid termini with mild atherosclerotic irregularity but no significant stenosis. ACAs and MCAs are patent without evidence of a proximal  branch occlusion or significant proximal stenosis. The right ACA is dominant. No aneurysm is identified. Posterior circulation: The intracranial vertebral arteries are widely patent to the basilar. Patent PICA and SCA origins are seen bilaterally. The basilar artery is patent and irregular with mild stenosis distally. There are left larger than right posterior communicating arteries with hypoplasia of the left P1 segment. The PCAs are patent with mild branch vessel irregularity but no evidence of a significant proximal stenosis. No aneurysm is identified. Venous sinuses: Patent. Prominent arachnoid granulation in the posterior aspect of the superior sagittal sinus. Anatomic variants: Predominantly fetal type origin of the left PCA. Dominant right ACA. Review of the MIP images confirms the above findings IMPRESSION: 1. No large vessel occlusion. 2. Mild intracranial atherosclerosis without flow limiting proximal stenosis. 3. Moderate right vertebral artery origin stenosis. 4. Cervical carotid artery atherosclerosis without significant stenosis. 5. Aortic Atherosclerosis (ICD10-I70.0) and Emphysema (ICD10-J43.9). Electronically Signed   By: ALogan BoresM.D.   On: 01/10/2022 17:29     PERTINENT LAB RESULTS: CBC: Recent Labs    01/10/22 1310  WBC 6.3  HGB 16.3  HCT 47.5  PLT 166   CMET CMP     Component Value Date/Time   NA 135 01/10/2022 1310   NA 138 07/06/2021 1612   K 4.2 01/10/2022 1310   CL 107 01/10/2022 1310   CO2 23 01/10/2022 1310   GLUCOSE 100 (H) 01/10/2022 1310   BUN 8 01/10/2022 1310   BUN 17 07/06/2021 1612   CREATININE 0.96 01/10/2022 1310   CALCIUM 8.5 (L) 01/10/2022 1310  PROT 6.2 (L) 01/10/2022 1310   ALBUMIN 3.4 (L) 01/10/2022 1310   AST 17 01/10/2022 1310   ALT 11 01/10/2022 1310   ALKPHOS 38 01/10/2022 1310   BILITOT 1.2 01/10/2022 1310   GFRNONAA >60 01/10/2022 1310   GFRAA 88 01/26/2020 1650    GFR Estimated Creatinine Clearance: 81.3 mL/min (by C-G formula  based on SCr of 0.96 mg/dL). No results for input(s): "LIPASE", "AMYLASE" in the last 72 hours. No results for input(s): "CKTOTAL", "CKMB", "CKMBINDEX", "TROPONINI" in the last 72 hours. Invalid input(s): "POCBNP" No results for input(s): "DDIMER" in the last 72 hours. Recent Labs    01/10/22 1310  HGBA1C 5.5   Recent Labs    01/11/22 0530  CHOL 155  HDL 34*  LDLCALC 109*  TRIG 62  CHOLHDL 4.6   No results for input(s): "TSH", "T4TOTAL", "T3FREE", "THYROIDAB" in the last 72 hours.  Invalid input(s): "FREET3" No results for input(s): "VITAMINB12", "FOLATE", "FERRITIN", "TIBC", "IRON", "RETICCTPCT" in the last 72 hours. Coags: Recent Labs    01/10/22 1310  INR 1.2   Microbiology: Recent Results (from the past 240 hour(s))  Resp Panel by RT-PCR (Flu A&B, Covid) Anterior Nasal Swab     Status: None   Collection Time: 01/10/22  2:33 PM   Specimen: Anterior Nasal Swab  Result Value Ref Range Status   SARS Coronavirus 2 by RT PCR NEGATIVE NEGATIVE Final    Comment: (NOTE) SARS-CoV-2 target nucleic acids are NOT DETECTED.  The SARS-CoV-2 RNA is generally detectable in upper respiratory specimens during the acute phase of infection. The lowest concentration of SARS-CoV-2 viral copies this assay can detect is 138 copies/mL. A negative result does not preclude SARS-Cov-2 infection and should not be used as the sole basis for treatment or other patient management decisions. A negative result may occur with  improper specimen collection/handling, submission of specimen other than nasopharyngeal swab, presence of viral mutation(s) within the areas targeted by this assay, and inadequate number of viral copies(<138 copies/mL). A negative result must be combined with clinical observations, patient history, and epidemiological information. The expected result is Negative.  Fact Sheet for Patients:  EntrepreneurPulse.com.au  Fact Sheet for Healthcare Providers:   IncredibleEmployment.be  This test is no t yet approved or cleared by the [place] FDA and  has been authorized for detection and/or diagnosis of SARS-CoV-2 by FDA under an Emergency Use Authorization (EUA). This EUA will remain  in effect (meaning this test can be used) for the duration of the COVID-19 declaration under Section 564(b)(1) of the Act, 21 U.S.C.section 360bbb-3(b)(1), unless the authorization is terminated  or revoked sooner.       Influenza A by PCR NEGATIVE NEGATIVE Final   Influenza B by PCR NEGATIVE NEGATIVE Final    Comment: (NOTE) The Xpert Xpress SARS-CoV-2/FLU/RSV plus assay is intended as an aid in the diagnosis of influenza from Nasopharyngeal swab specimens and should not be used as a sole basis for treatment. Nasal washings and aspirates are unacceptable for Xpert Xpress SARS-CoV-2/FLU/RSV testing.  Fact Sheet for Patients: EntrepreneurPulse.com.au  Fact Sheet for Healthcare Providers: IncredibleEmployment.be  This test is not yet approved or cleared by the [place] FDA and has been authorized for detection and/or diagnosis of SARS-CoV-2 by FDA under an Emergency Use Authorization (EUA). This EUA will remain in effect (meaning this test can be used) for the duration of the COVID-19 declaration under Section 564(b)(1) of the Act, 21 U.S.C. section 360bbb-3(b)(1), unless the authorization is terminated or revoked.  Performed at [facility], [place] [address], [place], [place] [address]     FURTHER DISCHARGE INSTRUCTIONS:  Get Medicines reviewed and adjusted: Please take all your medications with you for your next visit with your Primary MD  Laboratory/radiological data: Please request your Primary MD to go over all hospital tests and procedure/radiological results at the follow up, please ask your Primary MD to get all Hospital records sent to his/her office.  In some cases,  they will be blood work, cultures and biopsy results pending at the time of your discharge. Please request that your primary care M.D. goes through all the records of your hospital data and follows up on these results.  Also Note the following: If you experience worsening of your admission symptoms, develop shortness of breath, life threatening emergency, suicidal or homicidal thoughts you must seek medical attention immediately by calling 911 or calling your MD immediately  if symptoms less severe.  You must read complete instructions/literature along with all the possible adverse reactions/side effects for all the Medicines you take and that have been prescribed to you. Take any new Medicines after you have completely understood and accpet all the possible adverse reactions/side effects.   Do not drive when taking Pain medications or sleeping medications (Benzodaizepines)  Do not take more than prescribed Pain, Sleep and Anxiety Medications. It is not advisable to combine anxiety,sleep and pain medications without talking with your primary care practitioner  Special Instructions: If you have smoked or chewed Tobacco  in the last 2 yrs please stop smoking, stop any regular Alcohol  and or any Recreational drug use.  Wear Seat belts while driving.  Please note: You were cared for by a hospitalist during your hospital stay. Once you are discharged, your primary care physician will handle any further medical issues. Please note that NO REFILLS for any discharge medications will be authorized once you are discharged, as it is imperative that you return to your primary care physician (or establish a relationship with a primary care physician if you do not have one) for your post hospital discharge needs so that they can reassess your need for medications and monitor your lab values.  Total Time spent coordinating discharge including counseling, education and face to face time equals greater than 30  minutes.  SignedOren Binet 01/12/2022 4:11 PM

## 2022-01-11 NOTE — Evaluation (Signed)
Occupational Therapy Evaluation Patient Details Name: Mike Bean MRN: [id number] DOB: 11/15/1960 Today's Date: 01/11/2022   History of Present Illness Pt is a 61 y/o male presenting on 9/13 with L face and hand numbness.  CT with possible age indeterminate lacunar infarct in R thalamus per neurology; repeat CT with no acute abnormality but old L parietal infarct. Unable to complete MRI d/t pacemaker. PMH includes: CHF, nonischemic cardiomyopathy, pacemaker, afib.   Clinical Impression   PTA Patient independent, driving, and working. Admitted for above and presents near baseline independent level.  He completes ADLs with independence, engages in functional mobility and dual cognitive tasks without assist. Mild unsteadiness during mobility, but reports he feels back to his baseline (as he has vertigo).  Plans to dc home with support initially. Based on performance today, no further OT needs have been identified and OT will sign off.      Recommendations for follow up therapy are one component of a multi-disciplinary discharge planning process, led by the attending physician.  Recommendations may be updated based on patient status, additional functional criteria and insurance authorization.   Follow Up Recommendations  No OT follow up    Assistance Recommended at Discharge PRN  Patient can return home with the following      Functional Status Assessment     Equipment Recommendations  None recommended by OT    Recommendations for Other Services PT consult     Precautions / Restrictions Restrictions Weight Bearing Restrictions: No      Mobility Bed Mobility Overal bed mobility: Independent                  Transfers Overall transfer level: Independent                        Balance Overall balance assessment: Mild deficits observed, not formally tested                                         ADL either performed or assessed with clinical  judgement   ADL Overall ADL's : Modified independent                                       General ADL Comments: modified independent for toileting, grooming and LB dressing     Vision Baseline Vision/History: 1 Wears glasses (distance (driving)) Ability to See in Adequate Light: 0 Adequate Vision Assessment?: No apparent visual deficits     Perception     Praxis      Pertinent Vitals/Pain Pain Assessment Pain Assessment: No/denies pain     Hand Dominance Right   Extremity/Trunk Assessment Upper Extremity Assessment Upper Extremity Assessment: Overall WFL for tasks assessed   Lower Extremity Assessment Lower Extremity Assessment: Defer to PT evaluation   Cervical / Trunk Assessment Cervical / Trunk Assessment: Normal   Communication Communication Communication: No difficulties   Cognition Arousal/Alertness: Awake/alert Behavior During Therapy: Impulsive Overall Cognitive Status: Within Functional Limits for tasks assessed                                 General Comments: pt oriented and completing mulitple step commands, dual cog tasks with independence.     General Comments  mild unsteadiness with mobility in room/hallway, reports hx of vertigo.  pt reports he is at his baseline.    Exercises     Shoulder Instructions      Home Living Family/patient expects to be discharged to:: Private residence Living Arrangements: Other relatives (brother or staying with his daughter) Available Help at Discharge: Family (most of the time) Type of Home: House Home Access: Stairs to enter [employer] of Steps: 2-3   Home Layout: One level     Bathroom Shower/Tub: Tub/shower unit;Walk-in shower         Home Equipment: Kasandra Knudsen - single point   Additional Comments: reports plan to stay in [place] initally with support, then travel to beach to stay with his daughter      Prior Functioning/Environment Prior Level of  Function : Independent/Modified Independent;Driving;Working/employed               ADLs Comments: salesman- Web designer        OT Problem List:        OT Treatment/Interventions:      OT Goals(Current goals can be found in the care plan section) Acute Rehab OT Goals Patient Stated Goal: home OT Goal Formulation: With patient  OT Frequency:      Co-evaluation              AM-PAC OT "6 Clicks" Daily Activity     Outcome Measure Help from another person eating meals?: None Help from another person taking care of personal grooming?: None Help from another person toileting, which includes using toliet, bedpan, or urinal?: None Help from another person bathing (including washing, rinsing, drying)?: None Help from another person to put on and taking off regular upper body clothing?: None Help from another person to put on and taking off regular lower body clothing?: None 6 Click Score: 24   End of Session Nurse Communication: Mobility status  Activity Tolerance: Patient tolerated treatment well Patient left: in bed;with call bell/phone within reach;with family/visitor present  OT Visit Diagnosis: Other abnormalities of gait and mobility (R26.89)                Time: 1255-1314 OT Time Calculation (min): 19 min Charges:  OT General Charges $OT Visit: 1 Visit OT Evaluation $OT Eval Low Complexity: 1 Low  Jolaine Artist, OT Acute Rehabilitation Services Office [phone]   Delight Stare 01/11/2022, 1:41 PM

## 2022-01-12 ENCOUNTER — Telehealth: Payer: Self-pay | Admitting: Internal Medicine

## 2022-01-12 DIAGNOSIS — I639 Cerebral infarction, unspecified: Secondary | ICD-10-CM | POA: Diagnosis not present

## 2022-01-12 DIAGNOSIS — I4821 Permanent atrial fibrillation: Secondary | ICD-10-CM | POA: Diagnosis not present

## 2022-01-12 DIAGNOSIS — I5022 Chronic systolic (congestive) heart failure: Secondary | ICD-10-CM | POA: Diagnosis not present

## 2022-01-12 MED ORDER — ATORVASTATIN CALCIUM 80 MG PO TABS: 80.0000 mg | ORAL_TABLET | Freq: Every day | ORAL | 2 refills | Status: AC

## 2022-01-12 NOTE — Progress Notes (Signed)
V/s for 0400 till now was not done since pt asleep

## 2022-01-12 NOTE — Progress Notes (Signed)
PROGRESS NOTE        PATIENT DETAILS Name: STORMY SABOL Age: 61 y.o. Sex: male Date of Birth: [birth year] Admit Date: 01/10/2022 Admitting Physician Evalee Mutton Kristeen Mans, MD CXK:GYJEHUDJ, Joelene Millin, NP  Brief Summary: 61 year old male with history of permanent atrial fibrillation, HFrEF, PPM placement-who presented to the hospital with left-sided numbness.  He was subsequently admitted to the hospitalist service.  See below for further details.  Significant events: 9/13>>Admit to [facility]-stroke like symp  Significant studies: 9/13>>CT head: no acute intracranial abnormality 9/13>>CT Angio head/neck: no LVO-no flow limiting stenosis 9/13>>A1C:5.5 9/13>>UDS: +ve cocaine/tetrahydrocannabinol 9/14>>CT head: no acute intracranial abnormality 9/14>>Echo: EF 35-40&-no shunt 9/14>>LDL:109  Significant microbiology data: 9/13>>covid/influenza SHF:[id number]  Procedures: None  Consults: Neuro Psych  Subjective: Calm and quiet this morning.  Very understanding of why the IVC process was initiated.  He denies any homicidal/suicidal threats against his family.  Note-MD rounded with bedside RN this morning.  Daughter at bedside on 9/14 confirmed that patient did not make any such threats.  Objective: Vitals: Blood pressure (!) 124/92, pulse 70, temperature (!) 97.5 F (36.4 C), temperature source Oral, resp. rate 16, height '5\' 10"'$  (1.778 m), weight 81.6 kg, SpO2 100 %.   Exam: Gen Exam:Alert awake-not in any distress HEENT:atraumatic, normocephalic Chest: B/L clear to auscultation anteriorly CVS:S1S2 regular Abdomen:soft non tender, non distended Extremities:no edema Neurology: Non focal Skin: no rash   Pertinent Labs/Radiology:    Latest Ref Rng & Units 01/10/2022    1:10 PM 01/26/2020    4:50 PM 06/09/2019    3:29 PM  CBC  WBC 4.0 - 10.5 K/uL 6.3  9.5  9.6   Hemoglobin 13.0 - 17.0 g/dL 16.3  17.1  17.9   Hematocrit 39.0 - 52.0 % 47.5  50.7  52.8    Platelets 150 - 400 K/uL 166  191  175     Lab Results  Component Value Date   NA 135 01/10/2022   K 4.2 01/10/2022   CL 107 01/10/2022   CO2 23 01/10/2022      Assessment/Plan: 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 Unable to do MRI as pacer is incompatible.Appreciate neurology input-continue Eliquis/statin.  Counseling provided regarding compliance to medications and the importance of avoiding cocaine use.  Permanent atrial fibrillation:Paced rhythm-continue metoprolol-continue Eliquis. Note missed several doses of eliquis recently  Chronic HFrEF: Euvolemic-allow permissive HTN-hold entresto/farxiga  HLD: Continue Lipitor-he apparently has not been taking Lipitor prior to this hospitalization  EtOH use: Counseled extensively again.Anxious but not in withdrawal-monitor closely-ativan per CIWA.  Cocaine use: UDS positive for cocaine-acknowledges doing cocaine-have counseled extensively.   ?  Homicidal/suicidal ideation: On 9/14 when discharge plans were being made-nursing staff received a call from patient's son Hart Carwin saying patient had made homicidal threats against Hart Carwin and the patient's brother.  Patient denied ever making such threats (daughter and another family member at bedside on 9/14 confirmed that patient never made such threats).  However after discussion with psychiatry-given agitation/refusal of PT-threatening to leave AMA-drug use-it was felt that patient needed a formal psychiatric evaluation before consideration of safe discharge.  Hence patient was IVC'd.  Psychiatry evaluation in progress-psychiatry team is trying to get collateral history from family members.  But suspect that the patient's son Hart Carwin wants the patient to  go to drug rehab-which the patient is not keen on going-this may be playing a role-amongst other family dynamics.  Will await further recommendations from  psychiatry service.  BMI: Estimated body mass index is 25.83 kg/m as calculated from the following:   Height as of this encounter: '5\' 10"'$  (1.778 m).   Weight as of this encounter: 81.6 kg.   Code status:   Code Status: Full Code   DVT Prophylaxis: apixaban (ELIQUIS) tablet 5 mg    Family Communication: Daughter at bedside this am   Disposition Plan: Status is: Observation The patient remains OBS appropriate and will d/c before 2 midnights.   Planned Discharge Destination:Home   Diet: Diet Order             Diet Heart Room service appropriate? Yes; Fluid consistency: Thin; Fluid restriction: 1200 mL Fluid  Diet effective now                     Antimicrobial agents: Anti-infectives (From admission, onward)    None        MEDICATIONS: Scheduled Meds:  apixaban  5 mg Oral BID   atorvastatin  80 mg Oral Daily   folic acid  1 mg Oral Daily   metoprolol succinate  50 mg Oral Daily   multivitamin with minerals  1 tablet Oral Daily   nicotine  7 mg Transdermal Daily   thiamine  100 mg Oral Daily   Or   thiamine  100 mg Intravenous Daily   Continuous Infusions:  sodium chloride     PRN Meds:.acetaminophen **OR** acetaminophen (TYLENOL) oral liquid 160 mg/5 mL **OR** acetaminophen, albuterol, LORazepam **OR** LORazepam, senna-docusate   I have personally reviewed following labs and imaging studies  LABORATORY DATA: CBC: Recent Labs  Lab 01/10/22 1310  WBC 6.3  NEUTROABS 4.2  HGB 16.3  HCT 47.5  MCV 92.6  PLT 166     Basic Metabolic Panel: Recent Labs  Lab 01/10/22 1310  NA 135  K 4.2  CL 107  CO2 23  GLUCOSE 100*  BUN 8  CREATININE 0.96  CALCIUM 8.5*     GFR: Estimated Creatinine Clearance: 81.3 mL/min (by C-G formula based on SCr of 0.96 mg/dL).  Liver Function Tests: Recent Labs  Lab 01/10/22 1310  AST 17  ALT 11  ALKPHOS 38  BILITOT 1.2  PROT 6.2*  ALBUMIN 3.4*    No results for input(s): "LIPASE", "AMYLASE" in  the last 168 hours. No results for input(s): "AMMONIA" in the last 168 hours.  Coagulation Profile: Recent Labs  Lab 01/10/22 1310  INR 1.2     Cardiac Enzymes: No results for input(s): "CKTOTAL", "CKMB", "CKMBINDEX", "TROPONINI" in the last 168 hours.  BNP (last 3 results) No results for input(s): "PROBNP" in the last 8760 hours.  Lipid Profile: Recent Labs    01/11/22 0530  CHOL 155  HDL 34*  LDLCALC 109*  TRIG 62  CHOLHDL 4.6     Thyroid Function Tests: No results for input(s): "TSH", "T4TOTAL", "FREET4", "T3FREE", "THYROIDAB" in the last 72 hours.  Anemia Panel: No results for input(s): "VITAMINB12", "FOLATE", "FERRITIN", "TIBC", "IRON", "RETICCTPCT" in the last 72 hours.  Urine analysis:    Component Value Date/Time   COLORURINE STRAW (A) 01/10/2022 1436   APPEARANCEUR CLEAR 01/10/2022 1436   LABSPEC 1.004 (L) 01/10/2022 1436   PHURINE 5.0 01/10/2022 1436   GLUCOSEU 150 (A) 01/10/2022 1436   HGBUR NEGATIVE 01/10/2022 1436   BILIRUBINUR NEGATIVE 01/10/2022 1436  KETONESUR NEGATIVE 01/10/2022 1436   PROTEINUR NEGATIVE 01/10/2022 1436   UROBILINOGEN 0.2 12/07/2009 1434   NITRITE NEGATIVE 01/10/2022 1436   LEUKOCYTESUR NEGATIVE 01/10/2022 1436    Sepsis Labs: Lactic Acid, Venous No results found for: "LATICACIDVEN"  MICROBIOLOGY: Recent Results (from the past 240 hour(s))  Resp Panel by RT-PCR (Flu A&B, Covid) Anterior Nasal Swab     Status: None   Collection Time: 01/10/22  2:33 PM   Specimen: Anterior Nasal Swab  Result Value Ref Range Status   SARS Coronavirus 2 by RT PCR NEGATIVE NEGATIVE Final    Comment: (NOTE) SARS-CoV-2 target nucleic acids are NOT DETECTED.  The SARS-CoV-2 RNA is generally detectable in upper respiratory specimens during the acute phase of infection. The lowest concentration of SARS-CoV-2 viral copies this assay can detect is 138 copies/mL. A negative result does not preclude SARS-Cov-2 infection and should not be used  as the sole basis for treatment or other patient management decisions. A negative result may occur with  improper specimen collection/handling, submission of specimen other than nasopharyngeal swab, presence of viral mutation(s) within the areas targeted by this assay, and inadequate number of viral copies(<138 copies/mL). A negative result must be combined with clinical observations, patient history, and epidemiological information. The expected result is Negative.  Fact Sheet for Patients:  EntrepreneurPulse.com.au  Fact Sheet for Healthcare Providers:  IncredibleEmployment.be  This test is no t yet approved or cleared by the [place] FDA and  has been authorized for detection and/or diagnosis of SARS-CoV-2 by FDA under an Emergency Use Authorization (EUA). This EUA will remain  in effect (meaning this test can be used) for the duration of the COVID-19 declaration under Section 564(b)(1) of the Act, 21 U.S.C.section 360bbb-3(b)(1), unless the authorization is terminated  or revoked sooner.       Influenza A by PCR NEGATIVE NEGATIVE Final   Influenza B by PCR NEGATIVE NEGATIVE Final    Comment: (NOTE) The Xpert Xpress SARS-CoV-2/FLU/RSV plus assay is intended as an aid in the diagnosis of influenza from Nasopharyngeal swab specimens and should not be used as a sole basis for treatment. Nasal washings and aspirates are unacceptable for Xpert Xpress SARS-CoV-2/FLU/RSV testing.  Fact Sheet for Patients: EntrepreneurPulse.com.au  Fact Sheet for Healthcare Providers: IncredibleEmployment.be  This test is not yet approved or cleared by the [place] FDA and has been authorized for detection and/or diagnosis of SARS-CoV-2 by FDA under an Emergency Use Authorization (EUA). This EUA will remain in effect (meaning this test can be used) for the duration of the COVID-19 declaration under Section 564(b)(1)  of the Act, 21 U.S.C. section 360bbb-3(b)(1), unless the authorization is terminated or revoked.  Performed at [facility], [place] [address], [place], [place] [address]     RADIOLOGY STUDIES/RESULTS: ECHOCARDIOGRAM COMPLETE BUBBLE STUDY  Result Date: 01/11/2022    ECHOCARDIOGRAM REPORT   Patient Name:   SAQIB CAZAREZ Ernsberger Date of Exam: 01/11/2022 Medical Rec #:  [id number]    Height:       70.0 in Accession #:    [id number]   Weight:       180.0 lb Date of Birth:  May 08, 1960    BSA:          1.996 m Patient Age:    26 years     BP:           126/102 mmHg Patient Gender: M            HR:  70 bpm. Exam Location:  Inpatient Procedure: 2D Echo, Cardiac Doppler, Color Doppler and Saline Contrast Bubble            Study Indications:    Stroke  History:        Patient has prior history of Echocardiogram examinations, most                 recent 09/12/2020. Cardiomyopathy and CHF, Pacemaker,                 Arrythmias:Atrial Fibrillation; Risk Factors:Dyslipidemia and                 ETOH.  Sonographer:    Greer Pickerel Referring Phys: [place]  1. Left ventricular ejection fraction, by estimation, is 35 to 40%. Left ventricular ejection fraction by 2D MOD biplane is 35.6 %. The left ventricle has moderately decreased function. The left ventricle demonstrates global hypokinesis. The left ventricular internal cavity size was mildly dilated. Left ventricular diastolic function could not be evaluated.  2. Right ventricular systolic function is normal. The right ventricular size is mildly enlarged.  3. Left atrial size was moderately dilated.  4. Right atrial size was severely dilated.  5. The mitral valve is abnormal. Trivial mitral valve regurgitation.  6. The aortic valve is tricuspid. Aortic valve regurgitation is not visualized.  7. Aortic dilatation noted. There is mild dilatation of the aortic root, measuring 40 mm. There is borderline dilatation of the ascending aorta, measuring  38 mm.  8. The inferior vena cava is dilated in size with <50% respiratory variability, suggesting right atrial pressure of 15 mmHg.  9. Agitated saline contrast bubble study was negative, with no evidence of any interatrial shunt. Comparison(s): Changes from prior study are noted. 09/12/2020: LVEF 25-30%. FINDINGS  Left Ventricle: Left ventricular ejection fraction, by estimation, is 35 to 40%. Left ventricular ejection fraction by 2D MOD biplane is 35.6 %. The left ventricle has moderately decreased function. The left ventricle demonstrates global hypokinesis. The left ventricular internal cavity size was mildly dilated. There is no left ventricular hypertrophy. Left ventricular diastolic function could not be evaluated due to atrial fibrillation. Left ventricular diastolic function could not be evaluated. Right Ventricle: The right ventricular size is mildly enlarged. No increase in right ventricular wall thickness. Right ventricular systolic function is normal. Left Atrium: Left atrial size was moderately dilated. Right Atrium: Right atrial size was severely dilated. Pericardium: There is no evidence of pericardial effusion. Mitral Valve: The mitral valve is abnormal. There is mild calcification of the anterior and posterior mitral valve leaflet(s). Mild mitral annular calcification. Trivial mitral valve regurgitation. Tricuspid Valve: The tricuspid valve is grossly normal. Tricuspid valve regurgitation is trivial. Aortic Valve: The aortic valve is tricuspid. Aortic valve regurgitation is not visualized. Pulmonic Valve: The pulmonic valve was grossly normal. Pulmonic valve regurgitation is trivial. Aorta: Aortic dilatation noted. There is mild dilatation of the aortic root, measuring 40 mm. There is borderline dilatation of the ascending aorta, measuring 38 mm. Venous: The inferior vena cava is dilated in size with less than 50% respiratory variability, suggesting right atrial pressure of 15 mmHg. IAS/Shunts: No  atrial level shunt detected by color flow Doppler. Agitated saline contrast was given intravenously to evaluate for intracardiac shunting. Agitated saline contrast bubble study was negative, with no evidence of any interatrial shunt. Additional Comments: A device lead is visualized.  LEFT VENTRICLE PLAX 2D  Biplane EF (MOD) LVIDd:         5.80 cm         LV Biplane EF:   Left LVIDs:         4.80 cm                          ventricular LV PW:         1.00 cm                          ejection LV IVS:        0.70 cm                          fraction by LVOT diam:     2.20 cm                          2D MOD LV SV:         53                               biplane is LV SV Index:   27                               35.6 %. LVOT Area:     3.80 cm  LV Volumes (MOD) LV vol d, MOD    98.9 ml A2C: LV vol d, MOD    113.0 ml A4C: LV vol s, MOD    63.3 ml A2C: LV vol s, MOD    71.4 ml A4C: LV SV MOD A2C:   35.6 ml LV SV MOD A4C:   113.0 ml LV SV MOD BP:    37.7 ml RIGHT VENTRICLE             IVC RV S prime:     15.00 cm/s  IVC diam: 2.30 cm TAPSE (M-mode): 2.2 cm LEFT ATRIUM           Index        RIGHT ATRIUM           Index LA diam:      4.80 cm 2.40 cm/m   RA Area:     35.50 cm LA Vol (A2C): 67.3 ml 33.72 ml/m  RA Volume:   144.00 ml 72.15 ml/m LA Vol (A4C): 83.4 ml 41.79 ml/m  AORTIC VALVE LVOT Vmax:   53.80 cm/s LVOT Vmean:  52.000 cm/s LVOT VTI:    0.140 m  AORTA Ao Root diam: 4.00 cm Ao Asc diam:  3.80 cm  SHUNTS Systemic VTI:  0.14 m Systemic Diam: 2.20 cm Lyman Bishop MD Electronically signed by Lyman Bishop MD Signature Date/Time: 01/11/2022/4:07:12 PM    Final    CT HEAD WO CONTRAST (5MM)  Result Date: 01/11/2022 CLINICAL DATA:  Stroke follow-up EXAM: CT HEAD WITHOUT CONTRAST TECHNIQUE: Contiguous axial images were obtained from the base of the skull through the vertex without intravenous contrast. RADIATION DOSE REDUCTION: This exam was performed according to the departmental  dose-optimization program which includes automated exposure control, adjustment of the mA and/or kV according to patient size and/or use of iterative reconstruction technique. COMPARISON:  None Available. FINDINGS: Brain: Old left parietal infarct. Brain parenchyma otherwise normal. No acute hemorrhage. Vascular: No  abnormal hyperdensity of the major intracranial arteries or dural venous sinuses. No intracranial atherosclerosis. Skull: The visualized skull base, calvarium and extracranial soft tissues are normal. Sinuses/Orbits: No fluid levels or advanced mucosal thickening of the visualized paranasal sinuses. No mastoid or middle ear effusion. The orbits are normal. IMPRESSION: 1. No acute intracranial abnormality. 2. Old left parietal infarct. Electronically Signed   By: Ulyses Jarred M.D.   On: 01/11/2022 03:51   CT ANGIO HEAD NECK W WO CM  Result Date: 01/10/2022 CLINICAL DATA:  Neuro deficit, acute, stroke suspected. Left facial numbness. EXAM: CT ANGIOGRAPHY HEAD AND NECK TECHNIQUE: Multidetector CT imaging of the head and neck was performed using the standard protocol during bolus administration of intravenous contrast. Multiplanar CT image reconstructions and MIPs were obtained to evaluate the vascular anatomy. Carotid stenosis measurements (when applicable) are obtained utilizing NASCET criteria, using the distal internal carotid diameter as the denominator. RADIATION DOSE REDUCTION: This exam was performed according to the departmental dose-optimization program which includes automated exposure control, adjustment of the mA and/or kV according to patient size and/or use of iterative reconstruction technique. CONTRAST:  57m OMNIPAQUE IOHEXOL 350 MG/ML SOLN COMPARISON:  None Available. FINDINGS: CTA NECK FINDINGS Aortic arch: Standard 3 vessel aortic arch with a small to moderate amount of calcified plaque. No significant stenosis of the arch vessel origins. Right carotid system: Patent with a small  amount of calcified plaque at the carotid bifurcation and mixed calcified and soft plaque at the distal aspect of the carotid bulb. No evidence of a significant stenosis or dissection. Left carotid system: Patent with a small amount of predominantly calcified plaque in the carotid bulb. No evidence of a significant stenosis or dissection. Vertebral arteries: Patent and codominant with calcified plaque at the vertebral origins resulting in moderate stenosis on the right and no significant stenosis on the left. Skeleton: Cervicothoracic disc degeneration, focally severe at T1-2. Moderate cervical facet arthrosis. Other neck: No evidence of cervical lymphadenopathy or mass. Upper chest: Centrilobular and paraseptal emphysema. Review of the MIP images confirms the above findings CTA HEAD FINDINGS Anterior circulation: The internal carotid arteries are patent from skull base to carotid termini with mild atherosclerotic irregularity but no significant stenosis. ACAs and MCAs are patent without evidence of a proximal branch occlusion or significant proximal stenosis. The right ACA is dominant. No aneurysm is identified. Posterior circulation: The intracranial vertebral arteries are widely patent to the basilar. Patent PICA and SCA origins are seen bilaterally. The basilar artery is patent and irregular with mild stenosis distally. There are left larger than right posterior communicating arteries with hypoplasia of the left P1 segment. The PCAs are patent with mild branch vessel irregularity but no evidence of a significant proximal stenosis. No aneurysm is identified. Venous sinuses: Patent. Prominent arachnoid granulation in the posterior aspect of the superior sagittal sinus. Anatomic variants: Predominantly fetal type origin of the left PCA. Dominant right ACA. Review of the MIP images confirms the above findings IMPRESSION: 1. No large vessel occlusion. 2. Mild intracranial atherosclerosis without flow limiting  proximal stenosis. 3. Moderate right vertebral artery origin stenosis. 4. Cervical carotid artery atherosclerosis without significant stenosis. 5. Aortic Atherosclerosis (ICD10-I70.0) and Emphysema (ICD10-J43.9). Electronically Signed   By: ALogan BoresM.D.   On: 01/10/2022 17:29     LOS: 1 day   SOren Binet MD  [facility]    To contact the attending provider between 7A-7P or the covering provider during after hours 7P-7A, please log into the web  site [email or web address] and access using universal [place] password for that web site. If you do not have the password, please call the hospital operator.  01/12/2022, 1:54 PM

## 2022-01-12 NOTE — Progress Notes (Signed)
Order to discharge pt home.  Discharge instructions/AVS given to patient and reviewed - education provided as needed.  Pt advised to call PCP and/or come back to the hospital if there are any problems. Pt verbalized understanding.    

## 2022-01-12 NOTE — Plan of Care (Signed)
  Problem: Education: Goal: Knowledge of General Education information will improve Description: Including pain rating scale, medication(s)/side effects and non-pharmacologic comfort measures Outcome: Progressing   Problem: Health Behavior/Discharge Planning: Goal: Ability to manage health-related needs will improve Outcome: Progressing   Problem: Clinical Measurements: Goal: Ability to maintain clinical measurements within normal limits will improve Outcome: Progressing Goal: Will remain free from infection Outcome: Progressing Goal: Diagnostic test results will improve Outcome: Progressing Goal: Respiratory complications will improve Outcome: Progressing Goal: Cardiovascular complication will be avoided Outcome: Progressing   Problem: Activity: Goal: Risk for activity intolerance will decrease Outcome: Progressing   Problem: Nutrition: Goal: Adequate nutrition will be maintained Outcome: Progressing   Problem: Coping: Goal: Level of anxiety will decrease Outcome: Progressing   Problem: Elimination: Goal: Will not experience complications related to bowel motility Outcome: Progressing Goal: Will not experience complications related to urinary retention Outcome: Progressing   Problem: Pain Managment: Goal: General experience of comfort will improve Outcome: Progressing   Problem: Safety: Goal: Ability to remain free from injury will improve Outcome: Progressing   Problem: Skin Integrity: Goal: Risk for impaired skin integrity will decrease Outcome: Progressing   Problem: Education: Goal: Knowledge of disease or condition will improve Outcome: Progressing Goal: Knowledge of secondary prevention will improve (SELECT ALL) Outcome: Progressing Goal: Knowledge of patient specific risk factors will improve (INDIVIDUALIZE FOR PATIENT) Outcome: Progressing Goal: Individualized Educational Video(s) Outcome: Progressing   Problem: Coping: Goal: Will verbalize  positive feelings about self Outcome: Progressing Goal: Will identify appropriate support needs Outcome: Progressing   Problem: Health Behavior/Discharge Planning: Goal: Ability to manage health-related needs will improve Outcome: Progressing   Problem: Self-Care: Goal: Ability to participate in self-care as condition permits will improve Outcome: Progressing Goal: Verbalization of feelings and concerns over difficulty with self-care will improve Outcome: Progressing Goal: Ability to communicate needs accurately will improve Outcome: Progressing   Problem: Nutrition: Goal: Risk of aspiration will decrease Outcome: Progressing Goal: Dietary intake will improve Outcome: Progressing   Problem: Ischemic Stroke/TIA Tissue Perfusion: Goal: Complications of ischemic stroke/TIA will be minimized Outcome: Progressing   Problem: Education: Goal: Knowledge of disease or condition will improve Outcome: Progressing Goal: Understanding of discharge needs will improve Outcome: Progressing   Problem: Health Behavior/Discharge Planning: Goal: Ability to identify changes in lifestyle to reduce recurrence of condition will improve Outcome: Progressing Goal: Identification of resources available to assist in meeting health care needs will improve Outcome: Progressing   Problem: Physical Regulation: Goal: Complications related to the disease process, condition or treatment will be avoided or minimized Outcome: Progressing   Problem: Safety: Goal: Ability to remain free from injury will improve Outcome: Progressing   Problem: Education: Goal: Ability to verbalize precipitating factors for violent behavior will improve Outcome: Progressing   Problem: Coping: Goal: Ability to verbalize frustrations and anger appropriately will improve Outcome: Progressing   Problem: Health Behavior/Discharge Planning: Goal: Ability to implement measures to prevent violent behavior in the future will  improve Outcome: Progressing   Problem: Safety: Goal: Ability to demonstrate self-control will improve Outcome: Progressing Goal: Ability to redirect hostility and anger into socially appropriate behaviors will improve Outcome: Progressing

## 2022-01-12 NOTE — Telephone Encounter (Signed)
Pt states that he would like a callback from nurse. He has questions that he needs to ask. Please advise

## 2022-01-12 NOTE — Plan of Care (Signed)
Problem: Education: Goal: Knowledge of General Education information will improve Description: Including pain rating scale, medication(s)/side effects and non-pharmacologic comfort measures 01/12/2022 1609 by Robinn Overholt, Rollen Sox, RN Outcome: Adequate for Discharge 01/12/2022 1102 by Aspasia Rude, Rollen Sox, RN Outcome: Progressing   Problem: Health Behavior/Discharge Planning: Goal: Ability to manage health-related needs will improve 01/12/2022 1609 by Emaad Nanna, Rollen Sox, RN Outcome: Adequate for Discharge 01/12/2022 1102 by Lawernce Earll, Rollen Sox, RN Outcome: Progressing   Problem: Clinical Measurements: Goal: Ability to maintain clinical measurements within normal limits will improve 01/12/2022 1609 by Alquan Morrish, Rollen Sox, RN Outcome: Adequate for Discharge 01/12/2022 1102 by Jimya Ciani, Rollen Sox, RN Outcome: Progressing Goal: Will remain free from infection 01/12/2022 1609 by Dayven Linsley, Rollen Sox, RN Outcome: Adequate for Discharge 01/12/2022 1102 by Zoella Roberti, Rollen Sox, RN Outcome: Progressing Goal: Diagnostic test results will improve 01/12/2022 1609 by Nazli Penn, Rollen Sox, RN Outcome: Adequate for Discharge 01/12/2022 1102 by Tryson Lumley, Rollen Sox, RN Outcome: Progressing Goal: Respiratory complications will improve 01/12/2022 1609 by Lupe Handley, Rollen Sox, RN Outcome: Adequate for Discharge 01/12/2022 1102 by Kerina Simoneau, Rollen Sox, RN Outcome: Progressing Goal: Cardiovascular complication will be avoided 01/12/2022 1609 by Idamay Hosein, Rollen Sox, RN Outcome: Adequate for Discharge 01/12/2022 1102 by Serayah Yazdani, Rollen Sox, RN Outcome: Progressing   Problem: Activity: Goal: Risk for activity intolerance will decrease 01/12/2022 1609 by Calyssa Zobrist, Rollen Sox, RN Outcome: Adequate for Discharge 01/12/2022 1102 by Shayona Hibbitts, Rollen Sox, RN Outcome: Progressing   Problem: Nutrition: Goal: Adequate nutrition will be maintained 01/12/2022 1609 by Lilybeth Vien, Rollen Sox, RN Outcome: Adequate for Discharge 01/12/2022 1102 by  Carlyon Nolasco L, RN Outcome: Progressing   Problem: Coping: Goal: Level of anxiety will decrease 01/12/2022 1609 by Davian Hanshaw, Rollen Sox, RN Outcome: Adequate for Discharge 01/12/2022 1102 by Sahily Biddle Carlean Jews, RN Outcome: Progressing   Problem: Elimination: Goal: Will not experience complications related to bowel motility 01/12/2022 1609 by Lusero Nordlund, Rollen Sox, RN Outcome: Adequate for Discharge 01/12/2022 1102 by Jabe Jeanbaptiste, Rollen Sox, RN Outcome: Progressing Goal: Will not experience complications related to urinary retention 01/12/2022 1609 by Lochlann Mastrangelo, Rollen Sox, RN Outcome: Adequate for Discharge 01/12/2022 1102 by Shyheem Whitham, Rollen Sox, RN Outcome: Progressing   Problem: Pain Managment: Goal: General experience of comfort will improve 01/12/2022 1609 by Addie Alonge, Rollen Sox, RN Outcome: Adequate for Discharge 01/12/2022 1102 by Jaydan Chretien L, RN Outcome: Progressing   Problem: Safety: Goal: Ability to remain free from injury will improve 01/12/2022 1609 by Viktoria Gruetzmacher, Rollen Sox, RN Outcome: Adequate for Discharge 01/12/2022 1102 by Raylene Carmickle L, RN Outcome: Progressing   Problem: Skin Integrity: Goal: Risk for impaired skin integrity will decrease 01/12/2022 1609 by Judit Awad, Rollen Sox, RN Outcome: Adequate for Discharge 01/12/2022 1102 by Kahron Kauth, Rollen Sox, RN Outcome: Progressing   Problem: Education: Goal: Knowledge of disease or condition will improve 01/12/2022 1609 by Erwin Nishiyama, Rollen Sox, RN Outcome: Adequate for Discharge 01/12/2022 1102 by Rie Mcneil Carlean Jews, RN Outcome: Progressing Goal: Knowledge of secondary prevention will improve (SELECT ALL) 01/12/2022 1609 by Duncan Dull, RN Outcome: Adequate for Discharge 01/12/2022 1102 by Zurri Rudden, Rollen Sox, RN Outcome: Progressing Goal: Knowledge of patient specific risk factors will improve (INDIVIDUALIZE FOR PATIENT) 01/12/2022 1609 by Duncan Dull, RN Outcome: Adequate for Discharge 01/12/2022 1102 by  Jasma Seevers, Rollen Sox, RN Outcome: Progressing Goal: Individualized Educational Video(s) 01/12/2022 1609 by Duncan Dull, RN Outcome: Adequate for Discharge 01/12/2022 1102 by Annagrace Carr, Rollen Sox, RN Outcome: Progressing   Problem: Coping: Goal: Will verbalize positive feelings about self 01/12/2022 1609 by Josie Dixon  L, RN Outcome: Adequate for Discharge 01/12/2022 1102 by Tajuanna Burnett, Rollen Sox, RN Outcome: Progressing Goal: Will identify appropriate support needs 01/12/2022 1609 by Arieliz Latino, Rollen Sox, RN Outcome: Adequate for Discharge 01/12/2022 1102 by Anjana Cheek, Rollen Sox, RN Outcome: Progressing   Problem: Health Behavior/Discharge Planning: Goal: Ability to manage health-related needs will improve 01/12/2022 1609 by Perla Echavarria, Rollen Sox, RN Outcome: Adequate for Discharge 01/12/2022 1102 by Bell Cai, Rollen Sox, RN Outcome: Progressing   Problem: Self-Care: Goal: Ability to participate in self-care as condition permits will improve 01/12/2022 1609 by Tesa Meadors, Rollen Sox, RN Outcome: Adequate for Discharge 01/12/2022 1102 by Yonis Carreon, Rollen Sox, RN Outcome: Progressing Goal: Verbalization of feelings and concerns over difficulty with self-care will improve 01/12/2022 1609 by Keshonda Monsour, Rollen Sox, RN Outcome: Adequate for Discharge 01/12/2022 1102 by Paden Senger, Rollen Sox, RN Outcome: Progressing Goal: Ability to communicate needs accurately will improve 01/12/2022 1609 by Nyja Westbrook, Rollen Sox, RN Outcome: Adequate for Discharge 01/12/2022 1102 by Haeven Nickle, Rollen Sox, RN Outcome: Progressing   Problem: Nutrition: Goal: Risk of aspiration will decrease 01/12/2022 1609 by Jezabelle Chisolm, Rollen Sox, RN Outcome: Adequate for Discharge 01/12/2022 1102 by Krishawn Vanderweele, Rollen Sox, RN Outcome: Progressing Goal: Dietary intake will improve 01/12/2022 1609 by Ole Lafon, Rollen Sox, RN Outcome: Adequate for Discharge 01/12/2022 1102 by Nur Rabold, Payne Garske L, RN Outcome: Progressing   Problem: Ischemic Stroke/TIA  Tissue Perfusion: Goal: Complications of ischemic stroke/TIA will be minimized 01/12/2022 1609 by Jolayne Branson, Rollen Sox, RN Outcome: Adequate for Discharge 01/12/2022 1102 by Azlin Zilberman, Rollen Sox, RN Outcome: Progressing   Problem: Education: Goal: Knowledge of disease or condition will improve 01/12/2022 1609 by Jevan Gaunt, Rollen Sox, RN Outcome: Adequate for Discharge 01/12/2022 1102 by Javin Nong, Rollen Sox, RN Outcome: Progressing Goal: Understanding of discharge needs will improve 01/12/2022 1609 by Jospeh Mangel, Rollen Sox, RN Outcome: Adequate for Discharge 01/12/2022 1102 by Mallissa Lorenzen, Rollen Sox, RN Outcome: Progressing   Problem: Health Behavior/Discharge Planning: Goal: Ability to identify changes in lifestyle to reduce recurrence of condition will improve 01/12/2022 1609 by Jonah Nestle, Rollen Sox, RN Outcome: Adequate for Discharge 01/12/2022 1102 by Dawana Asper, Rollen Sox, RN Outcome: Progressing Goal: Identification of resources available to assist in meeting health care needs will improve 01/12/2022 1609 by Fount Bahe, Rollen Sox, RN Outcome: Adequate for Discharge 01/12/2022 1102 by Asjia Berrios, Rollen Sox, RN Outcome: Progressing   Problem: Physical Regulation: Goal: Complications related to the disease process, condition or treatment will be avoided or minimized 01/12/2022 1609 by Kilan Banfill, Rollen Sox, RN Outcome: Adequate for Discharge 01/12/2022 1102 by Kapri Nero, Rollen Sox, RN Outcome: Progressing   Problem: Safety: Goal: Ability to remain free from injury will improve 01/12/2022 1609 by Raeann Offner, Rollen Sox, RN Outcome: Adequate for Discharge 01/12/2022 1102 by Messiyah Waterson, Rollen Sox, RN Outcome: Progressing   Problem: Education: Goal: Ability to verbalize precipitating factors for violent behavior will improve 01/12/2022 1609 by Ghina Bittinger, Rollen Sox, RN Outcome: Adequate for Discharge 01/12/2022 1102 by Blayne Frankie, Rollen Sox, RN Outcome: Progressing   Problem: Coping: Goal: Ability to verbalize frustrations  and anger appropriately will improve 01/12/2022 1609 by Tamber Burtch, Rollen Sox, RN Outcome: Adequate for Discharge 01/12/2022 1102 by Maddilynn Esperanza, Rollen Sox, RN Outcome: Progressing   Problem: Health Behavior/Discharge Planning: Goal: Ability to implement measures to prevent violent behavior in the future will improve 01/12/2022 1609 by Loyalty Brashier, Rollen Sox, RN Outcome: Adequate for Discharge 01/12/2022 1102 by Shaylon Aden, Rollen Sox, RN Outcome: Progressing   Problem: Safety: Goal: Ability to demonstrate self-control will improve 01/12/2022 1609 by Duncan Dull, RN Outcome: Adequate for Discharge 01/12/2022  1102 by Debanhi Blaker, Rollen Sox, RN Outcome: Progressing Goal: Ability to redirect hostility and anger into socially appropriate behaviors will improve 01/12/2022 1609 by Aleister Lady, Rollen Sox, RN Outcome: Adequate for Discharge 01/12/2022 1102 by Dollye Glasser, Rollen Sox, RN Outcome: Progressing

## 2022-01-12 NOTE — Consult Note (Signed)
[facility] Consult   Reason for Consult: Apparently making homicidal/suicidal thoughts per family report to nurse Referring Physician: Dr. Sloan Leiter Patient Identification: Mike Bean MRN:  [id number] Principal Diagnosis: Acute CVA (cerebrovascular accident) [facility]) Diagnosis:  Principal Problem:   Acute CVA (cerebrovascular accident) [facility]) Active Problems:   ATRIAL FIBRILLATION   Congestive heart failure ([place])   Pacemaker   CVA (cerebral vascular accident) ([place])   Total Time spent with patient: 1 hour  Subjective:   Mike Bean is a 61 y.o. male patient admitted with weakness involving his left face and hand.  He was evaluated by neurology and felt to require inpatient workup.  Prior to discharging from hospital, patient son reported he made homicidal threats to himself and another person.  Son requested patient be placed under IVC, which was initiated by the primary team.   Patient seen and assessed by NP, case review, and consulted with Dr. Barrington Ellison and primary team Dr. Sloan Leiter.  And at the time of this evaluation will recommend discharge with outpatient resources.  As noted psych consult and IVC were initiated against allegations made by the son for homicidal ideations.  Patient declines any previous suicide attempts, self-harm, suicidal thoughts/ideations.  He further denies any history of aggression, violence, being combative, legal charges.  While he does admit to intermittent use of cocaine, he reports family history of addiction and feels that he is doing okay compared to other family members.   Patient would not benefit from inpatient admission at this time. Would recommed outpatient, family therpay to address dis-coord, trauma.   During evaluation [name] is seated at the edge of hospital bed.  He is alert/oriented x 4; calm/cooperative; and mood congruent with affect.  Patient is speaking in a clear tone at moderate volume, and normal pace; with good eye contact.   He is thought process is coherent and relevant; There is no indication that he is currently responding to internal/external stimuli or experiencing delusional thought content.  Patient denies suicidal/self-harm/homicidal ideation, psychosis, and paranoia.  Patient has remained calm throughout assessment and has answered questions appropriately.  He is current findings are incongruent with information obtained in IVC, patient feels as his son want him to be IVC because he did not wish to go to substance abuse rehab.  He does not provide consent to speak with his son, however consent is obtained to speak with his daughter [name].  Unsuccessful attempt in reaching her to obtain collateral information.  Chart review does show patient was irritable and frustrated at times, and refused procedures in which she reports he was just ready to go.  He has not displayed any agitation, aggression, disruptive behaviors, combative behaviors during this admission.  Collateral obtained from [facility]: She reports the allegations against her father are false. She reports the patient is willing to stay with her and her husband on their farm for the next 14 days, and keep him safe. Patient wants to remain clean and she will supervise him for the next couple of weeks. She reports [place], and she can watch him. She identifies no new safety concerns, and have in fact initiated safety precautions such as reflective taping (vertigo), labeling cabinets of drawers, indoor shower walk in. We did review safety plan to include storing of medications, OTC medication, weapons, and knives. Daughter further confirms he has no history of suicide   Spoke to son [name].  Initially son was hesitant to provide  identifying factors to proceed with conversation.  He was able to successfully provide 2 identify factors to include name and date of birth, after advising him of [place].  This nurse practitioner did allow  son to verbalize and express his feelings and concerns.  He reports he is concerned about his father's increasing substance use.  He further reports that his father is attempting to withdrawal marginal sum of money out of his 401(k) " to buy a boat, but I think he is doing it to buy drugs.  What can I do to stop this from happening?  This is what I am mainly concerned about" did advise son to contact legal services or an attorney, regarding ways to freeze accounts.  Also discussed with son if he felt it to be appropriate, he could consider legal guardianship, however that would also have to go to the court process and he would have to be deemed incompetent or be assigned as a payee.  Did advise son that I am a nurse practitioner, and not a legal person who can provide legal advice therefore he must seek legal counsel for appropriate recommendations. We further discussed IVC for outpatient and inpatient substance, as an option.  Duty to warn was completed on today's date at 32pm.  Son did not identify any new safety concerns.  Conversation was terminated sooner after.  All questions, comments, and concerns were addressed to the best of my ability.  Support, encouragement, reassurance were offered.  HPI:  Mike Bean is a 61 y.o. male with medical history significant of permanent atrial fibrillation, HFrEF, PPM placement-presented with numbness involving his left face and hand.  Per patient-he noticed that his left face/left hand was numb since around 3 AM this morning when he first woke up.  His last known normal time was around 11 PM the night before.  While waiting in the emergency room-patient claims that his left-sided numbness has improved somewhat but have not completely resolved.  He was evaluated by neurology and subsequently felt to require inpatient work-up.  He was not felt to require thrombolysis because he was outside the window.  Past Psychiatric History: Pt denies ever been hospitalized for  mental health concerns in the past. Denies any previous history of suicidal thoughts, suicidal ideations, and or non suicidal self injurious behaviors. Pt denies history of aggression, agitation, violent behavior, and or history of homicidal ideations/thoughts.  Patient further denies any current, previous legal charges.  Patient further denies access to guns, weapons, or any engagement with the legal system.  Patient denies history of illicit substances to include synthetic substances, any cannabidiol, supplemental herbs.    Risk to Self:  Denies Risk to Others:  Denies Prior Inpatient Therapy:   Denies Prior Outpatient Therapy:  Denies  Past Medical History:  Past Medical History:  Diagnosis Date   CHF (congestive heart failure) ([place])    History of alcohol abuse    Hyperlipidemia    Nonischemic cardiomyopathy ([place])    Presumed to be tachycardia mediated   Pacemaker 2012   Persistent atrial fibrillation ([place]) 2012   Snores     Past Surgical History:  Procedure Laterality Date   APPENDECTOMY     S/P   BIV PACEMAKER GENERATOR CHANGEOUT N/A 02/08/2020   Procedure: BIV PACEMAKER GENERATOR CHANGEOUT;  Surgeon: Evans Lance, MD;  Location: [facility];  Service: Cardiovascular;  Laterality: N/A;   CARDIAC CATHETERIZATION  11/2009   Normal coronary arteries    PACEMAKER INSERTION  2012   [facility]   Family History:  Family History  Problem Relation Age of Onset   Heart attack Father 18       MI   Colon cancer Neg Hx    Family Psychiatric  History: Father with alcohol use disorder,brother - hx of polysubstance abuse. Daughter with substance abuse/addiction   Social History: Works with homeless ministries.Writer by trade, currently on part time schedule due to his worsening vertigo. He loves artistry (making flowers), sailing,beach. Completed an associates degree in business. He has two children, divorced , (1) dog . He currently resides with his brother, plans  to move out soon.  Social History   Substance and Sexual Activity  Alcohol Use Yes   Alcohol/week: 21.0 standard drinks of alcohol   Types: 21 Standard drinks or equivalent per week   Comment: At least 3 beers a day     Social History   Substance and Sexual Activity  Drug Use No    Social History   Socioeconomic History   Marital status: Divorced    Spouse name: Not on file   Number of children: 2   Years of education: Not on file   Highest education level: Associate degree: occupational, Hotel manager, or vocational program  Occupational History   Occupation: Works in Scientist, clinical (histocompatibility and immunogenetics): [employer] Use   Smoking status: Every Day    Packs/day: 0.50    Types: Cigarettes   Smokeless tobacco: Never  Vaping Use   Vaping Use: Never used  Substance and Sexual Activity   Alcohol use: Yes    Alcohol/week: 21.0 standard drinks of alcohol    Types: 21 Standard drinks or equivalent per week    Comment: At least 3 beers a day   Drug use: No   Sexual activity: Not on file  Other Topics Concern   Not on file  Social History Narrative   Lives in [place]   2 children, no grandchildren   Social Determinants of Health   Financial Resource Strain: Not on file  Food Insecurity: Not on file  Transportation Needs: Not on file  Physical Activity: Not on file  Stress: Not on file  Social Connections: Not on file   Additional Social History:    Allergies:  No Known Allergies  Labs:  Results for orders placed or performed during the hospital encounter of 01/10/22 (from the past 48 hour(s))  Protime-INR     Status: None   Collection Time: 01/10/22  1:10 PM  Result Value Ref Range   Prothrombin Time 15.0 11.4 - 15.2 seconds   INR 1.2 0.8 - 1.2    Comment: (NOTE) INR goal varies based on device and disease states. Performed at [facility], [place] [address], [place], [place] [address]   APTT     Status: None   Collection Time: 01/10/22  1:10 PM  Result  Value Ref Range   aPTT 35 24 - 36 seconds    Comment: Performed at [facility] [address], [place] [address]  CBC     Status: None   Collection Time: 01/10/22  1:10 PM  Result Value Ref Range   WBC 6.3 4.0 - 10.5 K/uL   RBC 5.13 4.22 - 5.81 MIL/uL   Hemoglobin 16.3 13.0 - 17.0 g/dL   HCT 47.5 39.0 - 52.0 %   MCV 92.6 80.0 - 100.0 fL   MCH 31.8 26.0 - 34.0 pg  MCHC 34.3 30.0 - 36.0 g/dL   RDW 12.8 11.5 - 15.5 %   Platelets 166 150 - 400 K/uL   nRBC 0.0 0.0 - 0.2 %    Comment: Performed at [facility], [place] [address], [place], [place] [address]  Ethanol     Status: None   Collection Time: 01/10/22  1:10 PM  Result Value Ref Range   Alcohol, Ethyl (B) <10 <10 mg/dL    Comment: (NOTE) Lowest detectable limit for serum alcohol is 10 mg/dL.  For medical purposes only. Performed at [facility], [place] [address] [place], [place] [address]   Differential     Status: None   Collection Time: 01/10/22  1:10 PM  Result Value Ref Range   Neutrophils Relative % 67 %   Neutro Abs 4.2 1.7 - 7.7 K/uL   Lymphocytes Relative 16 %   Lymphs Abs 1.0 0.7 - 4.0 K/uL   Monocytes Relative 13 %   Monocytes Absolute 0.8 0.1 - 1.0 K/uL   Eosinophils Relative 2 %   Eosinophils Absolute 0.1 0.0 - 0.5 K/uL   Basophils Relative 1 %   Basophils Absolute 0.1 0.0 - 0.1 K/uL   Immature Granulocytes 1 %   Abs Immature Granulocytes 0.05 0.00 - 0.07 K/uL    Comment: Performed at [facility] [address], [place], [place] [address]  Comprehensive metabolic panel     Status: Abnormal   Collection Time: 01/10/22  1:10 PM  Result Value Ref Range   Sodium 135 135 - 145 mmol/L   Potassium 4.2 3.5 - 5.1 mmol/L   Chloride 107 98 - 111 mmol/L   CO2 23 22 - 32 mmol/L   Glucose, Bld 100 (H) 70 - 99 mg/dL    Comment: Glucose reference range applies only to samples taken after fasting for at least 8 hours.   BUN 8 8 - 23 mg/dL   Creatinine, Ser 0.96 0.61 - 1.24 mg/dL    Calcium 8.5 (L) 8.9 - 10.3 mg/dL   Total Protein 6.2 (L) 6.5 - 8.1 g/dL   Albumin 3.4 (L) 3.5 - 5.0 g/dL   AST 17 15 - 41 U/L   ALT 11 0 - 44 U/L   Alkaline Phosphatase 38 38 - 126 U/L   Total Bilirubin 1.2 0.3 - 1.2 mg/dL   GFR, Estimated >60 >60 mL/min    Comment: (NOTE) Calculated using the CKD-EPI Creatinine Equation (2021)    Anion gap 5 5 - 15    Comment: Performed at [facility], [place] [address] [place], [place] [address]  Hemoglobin A1c     Status: None   Collection Time: 01/10/22  1:10 PM  Result Value Ref Range   Hgb A1c MFr Bld 5.5 4.8 - 5.6 %    Comment: (NOTE) Pre diabetes:          5.7%-6.4%  Diabetes:              >6.4%  Glycemic control for   <7.0% adults with diabetes    Mean Plasma Glucose 111.15 mg/dL    Comment: Performed at [facility] [address], [place], [place] [address]  CBG monitoring, ED     Status: None   Collection Time: 01/10/22  1:13 PM  Result Value Ref Range   Glucose-Capillary 89 70 - 99 mg/dL    Comment: Glucose reference range applies only to samples taken after fasting for at least 8 hours.  Resp Panel by RT-PCR (Flu A&B, Covid) Anterior Nasal Swab     Status: None   Collection Time: 01/10/22  2:33 PM   Specimen: Anterior Nasal Swab  Result Value Ref Range   SARS Coronavirus 2 by RT PCR NEGATIVE NEGATIVE    Comment: (NOTE) SARS-CoV-2 target nucleic acids are NOT DETECTED.  The SARS-CoV-2 RNA is generally detectable in upper respiratory specimens during the acute phase of infection. The lowest concentration of SARS-CoV-2 viral copies this assay can detect is 138 copies/mL. A negative result does not preclude SARS-Cov-2 infection and should not be used as the sole basis for treatment or other patient management decisions. A negative result may occur with  improper specimen collection/handling, submission of specimen other than nasopharyngeal swab, presence of viral mutation(s) within the areas targeted by this  assay, and inadequate number of viral copies(<138 copies/mL). A negative result must be combined with clinical observations, patient history, and epidemiological information. The expected result is Negative.  Fact Sheet for Patients:  EntrepreneurPulse.com.au  Fact Sheet for Healthcare Providers:  IncredibleEmployment.be  This test is no t yet approved or cleared by the [place] FDA and  has been authorized for detection and/or diagnosis of SARS-CoV-2 by FDA under an Emergency Use Authorization (EUA). This EUA will remain  in effect (meaning this test can be used) for the duration of the COVID-19 declaration under Section 564(b)(1) of the Act, 21 U.S.C.section 360bbb-3(b)(1), unless the authorization is terminated  or revoked sooner.       Influenza A by PCR NEGATIVE NEGATIVE   Influenza B by PCR NEGATIVE NEGATIVE    Comment: (NOTE) The Xpert Xpress SARS-CoV-2/FLU/RSV plus assay is intended as an aid in the diagnosis of influenza from Nasopharyngeal swab specimens and should not be used as a sole basis for treatment. Nasal washings and aspirates are unacceptable for Xpert Xpress SARS-CoV-2/FLU/RSV testing.  Fact Sheet for Patients: EntrepreneurPulse.com.au  Fact Sheet for Healthcare Providers: IncredibleEmployment.be  This test is not yet approved or cleared by the [place] FDA and has been authorized for detection and/or diagnosis of SARS-CoV-2 by FDA under an Emergency Use Authorization (EUA). This EUA will remain in effect (meaning this test can be used) for the duration of the COVID-19 declaration under Section 564(b)(1) of the Act, 21 U.S.C. section 360bbb-3(b)(1), unless the authorization is terminated or revoked.  Performed at [facility], [place] [address], [place], [place] [address]   Urinalysis, Routine w reflex microscopic Urine, Clean Catch     Status: Abnormal   Collection  Time: 01/10/22  2:36 PM  Result Value Ref Range   Color, Urine STRAW (A) YELLOW   APPearance CLEAR CLEAR   Specific Gravity, Urine 1.004 (L) 1.005 - 1.030   pH 5.0 5.0 - 8.0   Glucose, UA 150 (A) NEGATIVE mg/dL   Hgb urine dipstick NEGATIVE NEGATIVE   Bilirubin Urine NEGATIVE NEGATIVE   Ketones, ur NEGATIVE NEGATIVE mg/dL   Protein, ur NEGATIVE NEGATIVE mg/dL   Nitrite NEGATIVE NEGATIVE   Leukocytes,Ua NEGATIVE NEGATIVE    Comment: Performed at [facility] [address] [place], [place] [address]  Urine rapid drug screen (hosp performed)     Status: Abnormal   Collection Time: 01/10/22  2:36 PM  Result Value Ref Range   Opiates NONE DETECTED NONE DETECTED   Cocaine POSITIVE (A) NONE DETECTED   Benzodiazepines NONE DETECTED NONE DETECTED   Amphetamines NONE DETECTED NONE DETECTED   Tetrahydrocannabinol POSITIVE (A) NONE DETECTED  Barbiturates NONE DETECTED NONE DETECTED    Comment: (NOTE) DRUG SCREEN FOR MEDICAL PURPOSES ONLY.  IF CONFIRMATION IS NEEDED FOR ANY PURPOSE, NOTIFY LAB WITHIN 5 DAYS.  LOWEST DETECTABLE LIMITS FOR URINE DRUG SCREEN Drug Class                     Cutoff (ng/mL) Amphetamine and metabolites    1000 Barbiturate and metabolites    200 Benzodiazepine                 102 Tricyclics and metabolites     300 Opiates and metabolites        300 Cocaine and metabolites        300 THC                            50 Performed at [facility], [place] [address], [place], [place] [address]   Lipid panel     Status: Abnormal   Collection Time: 01/11/22  5:30 AM  Result Value Ref Range   Cholesterol 155 0 - 200 mg/dL   Triglycerides 62 <150 mg/dL   HDL 34 (L) >40 mg/dL   Total CHOL/HDL Ratio 4.6 RATIO   VLDL 12 0 - 40 mg/dL   LDL Cholesterol 109 (H) 0 - 99 mg/dL    Comment:        Total Cholesterol/HDL:CHD Risk Coronary Heart Disease Risk Table                     Men   Women  1/2 Average Risk   3.4   3.3  Average Risk       5.0   4.4  2 X  Average Risk   9.6   7.1  3 X Average Risk  23.4   11.0        Use the calculated Patient Ratio above and the CHD Risk Table to determine the patient's CHD Risk.        ATP III CLASSIFICATION (LDL):  <100     mg/dL   Optimal  100-129  mg/dL   Near or Above                    Optimal  130-159  mg/dL   Borderline  160-189  mg/dL   High  >190     mg/dL   Very High Performed at [facility] [address], [place], [place] [address]   HIV Antibody (routine testing w rflx)     Status: None   Collection Time: 01/11/22  5:30 AM  Result Value Ref Range   HIV Screen 4th Generation wRfx Non Reactive Non Reactive    Comment: Performed at [facility], [place] [address] [place], [place] [address]    Current Facility-Administered Medications  Medication Dose Route Frequency Provider Last Rate Last Admin   0.9 %  sodium chloride infusion   Intravenous Continuous [name], Henreitta Leber, MD       acetaminophen (TYLENOL) tablet 650 mg  650 mg Oral Q4H PRN Jonetta Osgood, MD   650 mg at 01/11/22 0002   Or   acetaminophen (TYLENOL) 160 MG/5ML solution 650 mg  650 mg Per Tube Q4H PRN Jonetta Osgood, MD       Or   acetaminophen (TYLENOL) suppository 650 mg  650 mg Rectal Q4H PRN [name], Henreitta Leber, MD  albuterol (PROVENTIL) (2.5 MG/3ML) 0.083% nebulizer solution 2.5 mg  2.5 mg Nebulization Q6H PRN [name], Henreitta Leber, MD       apixaban Arne Cleveland) tablet 5 mg  5 mg Oral BID Jonetta Osgood, MD   5 mg at 01/12/22 0805   atorvastatin (LIPITOR) tablet 80 mg  80 mg Oral Daily Jonetta Osgood, MD   80 mg at 10/23/92 8546   folic acid (FOLVITE) tablet 1 mg  1 mg Oral Daily Jonetta Osgood, MD   1 mg at 01/12/22 0805   LORazepam (ATIVAN) tablet 1-4 mg  1-4 mg Oral Q1H PRN Jonetta Osgood, MD   1 mg at 01/12/22 0805   Or   LORazepam (ATIVAN) injection 1-4 mg  1-4 mg Intravenous Q1H PRN Jonetta Osgood, MD       metoprolol succinate (TOPROL-XL) 24 hr tablet 50 mg  50 mg Oral  Daily Jonetta Osgood, MD   50 mg at 01/12/22 0805   multivitamin with minerals tablet 1 tablet  1 tablet Oral Daily Jonetta Osgood, MD   1 tablet at 01/12/22 0805   nicotine (NICODERM CQ - dosed in mg/24 hr) patch 7 mg  7 mg Transdermal Daily Rise Patience, MD   7 mg at 01/12/22 2703   senna-docusate (Senokot-S) tablet 1 tablet  1 tablet Oral QHS PRN Jonetta Osgood, MD       thiamine (VITAMIN B1) tablet 100 mg  100 mg Oral Daily Jonetta Osgood, MD   100 mg at 01/12/22 5009   Or   thiamine (VITAMIN B1) injection 100 mg  100 mg Intravenous Daily [name], Henreitta Leber, MD        Musculoskeletal: Strength & Muscle Tone: within normal limits Gait & Station: normal Patient leans: N/A            Psychiatric Specialty Exam:  Presentation  General Appearance: Appropriate for Environment; Casual Eye Contact:Minimal Speech:Clear and Coherent; Normal Rate Speech Volume:Normal Handedness:Right  Mood and Affect  Mood:Euthymic Affect:Appropriate; Congruent  Thought Process  Thought Processes:Coherent; Goal Directed; Linear Descriptions of Associations:Intact  Orientation:Full (Time, Place and Person)  Thought Content:Logical; WDL  History of Schizophrenia/Schizoaffective disorder:No data recorded Duration of Psychotic Symptoms:No data recorded Hallucinations:Hallucinations: None  Ideas of Reference:None  Suicidal Thoughts:Suicidal Thoughts: No  Homicidal Thoughts:Homicidal Thoughts: No   Sensorium  Memory:Immediate Fair; Recent Fair; Remote Fair Judgment:Good Insight:Good  Executive Functions  Concentration:Good Attention Span:Good [place] of Knowledge:Good Language:Good  Psychomotor Activity  Psychomotor Activity:Psychomotor Activity: Normal  Assets  Assets:Communication Skills; Desire for Improvement; Financial Resources/Insurance; Intimacy; Social Support; Leisure Time  Sleep  Sleep:Sleep: Good   Physical Exam Vitals and  nursing note reviewed.  Constitutional:      Appearance: Normal appearance. He is normal weight.  HENT:     Head: Normocephalic.  Eyes:     Pupils: Pupils are equal, round, and reactive to light.  Skin:    Capillary Refill: Capillary refill takes less than 2 seconds.  Neurological:     General: No focal deficit present.     Mental Status: He is alert and oriented to person, place, and time. Mental status is at baseline.  Psychiatric:        Mood and Affect: Mood normal.        Behavior: Behavior normal.        Thought Content: Thought content normal.        Judgment: Judgment normal.    Review of Systems  Psychiatric/Behavioral: Negative.  Blood pressure (!) 133/90, pulse 75, temperature 97.8 F (36.6 C), temperature source Oral, resp. rate 16, height '5\' 10"'$  (1.778 m), weight 81.6 kg, SpO2 97 %. Body mass index is 25.83 kg/m.  Treatment Plan Summary: Plan   Safety Risk Assessment: A suicide and violence risk assessment was performed as part of this evaluation. Risk factors for self-harm/suicide: Caucasian male, age, and substance use. Protective factors against self-harm/suicide: no known access to weapons or firearms, restricted access to highly lethal means of suicide, supportive family and employment or functioning in a structured work/academic setting. Risk factors for harm to others: high emotional distress. Protective factors against harm to others: no known history of violence towards others. While future psychiatric events cannot be accurately predicted, the patient is currently at low acute risk, and is at low or chronic risk of harm to self and is not currently at elevated acute risk, or elevated chronic risk of harm to others.  Recommendations: Duty to Warn attempted to call Nicholas"Nick" Benning  at [phone]. Successful x 2.   -Discontinue safety sitter.   -Rescind IVC only after the above information has been obtained. Patient does not appear to be of imminent  danger to self or others at this time.  *We do recommend that he participate in individual therapy to target substance use and improve coping skills. *We recommend that he participate in  family therapy to target the conflict with his family , and improving communication skills and conflict resolution skills.  *The patient should abstain from all illicit substances, alcohol, and peer pressure.  Safety Planning:  Patient's daughter confirms that safety planning has been complete and there are no sharps, medications, chemicals or firearms that patient can access at home. Discussed methods to reduce the risk of self-injury or suicide attempts: Frequent conversations regarding unsafe thoughts. Remove all significant sharps. Remove all firearms. Remove all medications, including over-the-counter meds. Consider lockbox for medications and having a responsible person dispense medications until patient has strengthened coping skills. Room checks for sharps or other harmful objects. Secure all chemical substances that can be ingested or inhaled.    Disposition: No evidence of imminent risk to self or others at present.   Patient does not meet criteria for psychiatric inpatient admission. Supportive therapy provided about ongoing stressors. Refer to IOP. Discussed crisis plan, support from social network, calling 911, coming to the Emergency Department, and calling Suicide Hotline.  Suella Broad, FNP 01/12/2022 11:42 AM

## 2022-01-12 NOTE — Evaluation (Signed)
Physical Therapy Evaluation Patient Details Name: Mike Bean MRN: [id number] DOB: [birth year] Today's Date: 01/12/2022  History of Present Illness  Pt is a 61 y/o male presenting on 9/13 with L face and hand numbness.  CT with possible age indeterminate lacunar infarct in R thalamus per neurology; repeat CT with no acute abnormality but old L parietal infarct. Unable to complete MRI d/t pacemaker. PMH includes: CHF, nonischemic cardiomyopathy, pacemaker, afib.  Clinical Impression  Patient presents with mild balance deficits and impaired mobility s/p above. Pt with hx of vertigo that impacts his balance and mobility at times. Pt independent PTA and works for a Teacher, adult education. Has supportive daughter and brother.  Today, pt tolerated transfers and ambulation with supervision for safety due to mild unsteadiness esp with head turns and in busy hallway environment. Pt reports all other symptoms have resolved. Understands triggers to vertigo. Reviewed importance of gaze stabilization esp for turning to reduce dizziness symptoms. Pt is functioning at baseline and does not require skilled therapy services. All education completed. Discharge from therapy.     Recommendations for follow up therapy are one component of a multi-disciplinary discharge planning process, led by the attending physician.  Recommendations may be updated based on patient status, additional functional criteria and insurance authorization.  Follow Up Recommendations No PT follow up      Assistance Recommended at Discharge PRN  Patient can return home with the following  Assist for transportation;Assistance with cooking/housework    Equipment Recommendations None recommended by PT  Recommendations for Other Services       Functional Status Assessment Patient has not had a recent decline in their functional status     Precautions / Restrictions Precautions Precautions: Fall;Other (comment) Precaution Comments: hx of  vertigo Restrictions Weight Bearing Restrictions: No      Mobility  Bed Mobility Overal bed mobility: Independent                  Transfers Overall transfer level: Independent                 General transfer comment: Stood from EOB x1, from toilet x1.    Ambulation/Gait Ambulation/Gait assistance: Supervision Gait Distance (Feet): 150 Feet Assistive device: None Gait Pattern/deviations: Step-through pattern, Decreased stride length, Staggering right, Staggering left, Wide base of support   Gait velocity interpretation: 1.31 - 2.62 ft/sec, indicative of limited community ambulator   General Gait Details: Slow, mildly unsteady gait which pt reports as baseline due to vertigo esp with head turns but no overt LOB.  Stairs            Wheelchair Mobility    Modified Rankin (Stroke Patients Only) Modified Rankin (Stroke Patients Only) Pre-Morbid Rankin Score: No significant disability Modified Rankin: No significant disability     Balance Overall balance assessment: Needs assistance Sitting-balance support: Feet supported, No upper extremity supported Sitting balance-Leahy Scale: Good     Standing balance support: During functional activity Standing balance-Leahy Scale: Fair Standing balance comment: Able to stand at sink and reach outside boS to wash hands without difficulty.             High level balance activites: Head turns, Sudden stops High Level Balance Comments: Noted to have unsteadiness esp with head turns, changes in direction- reports this is baseline due to hx of vertigo             Pertinent Vitals/Pain Pain Assessment Pain Assessment: No/denies pain    Home Living Family/patient expects  to be discharged to:: Private residence Living Arrangements: Other relatives (brother or staying with daughter) Available Help at Discharge: Family ("most of the time") Type of Home: House Home Access: Stairs to enter   [employer] of Steps: 2-3   Home Layout: One level [place]: Kasandra Knudsen - single point Additional Comments: reports plan to stay in [employer] with support, then travel to beach to stay with his daughter    Prior Function Prior Level of Function : Independent/Modified Independent;Driving;Working/employed             Mobility Comments: independent, loves to go to [employer] and go on boats. ADLs Comments: salesman- packing material     Hand Dominance   Dominant Hand: Right    Extremity/Trunk Assessment   Upper Extremity Assessment Upper Extremity Assessment: Defer to OT evaluation    Lower Extremity Assessment Lower Extremity Assessment: Overall WFL for tasks assessed    Cervical / Trunk Assessment Cervical / Trunk Assessment: Normal  Communication   Communication: No difficulties  Cognition Arousal/Alertness: Awake/alert Behavior During Therapy: Impulsive Overall Cognitive Status: Within Functional Limits for tasks assessed                                 General Comments: Pt upset about the whole situation about being held in the hospital. Able to state details of his hx of vertigo- warning signs it is coming on, inability to drive, taking medication to help etc. Good awareness of diagnosis and how to help minimze symptoms.        General Comments General comments (skin integrity, edema, etc.): Daughter present in middle of session.    Exercises     Assessment/Plan    PT Assessment Patient does not need any further PT services  PT Problem List         PT Treatment Interventions      PT Goals (Current goals can be found in the Care Plan section)  Acute Rehab PT Goals Patient Stated Goal: to go to the beach PT Goal Formulation: All assessment and education complete, DC therapy    Frequency       Co-evaluation               AM-PAC PT "6 Clicks" Mobility  Outcome Measure Help needed turning from your back to your side  while in a flat bed without using bedrails?: None Help needed moving from lying on your back to sitting on the side of a flat bed without using bedrails?: None Help needed moving to and from a bed to a chair (including a wheelchair)?: None Help needed standing up from a chair using your arms (e.g., wheelchair or bedside chair)?: None Help needed to walk in hospital room?: A Little Help needed climbing 3-5 steps with a railing? : A Little 6 Click Score: 22    End of Session   Activity Tolerance: Patient tolerated treatment well Patient left: in bed;with call bell/phone within reach;with nursing/sitter in room;with family/visitor present Nurse Communication: Mobility status PT Visit Diagnosis: Other abnormalities of gait and mobility (R26.89)    Time: 1448-1856 PT Time Calculation (min) (ACUTE ONLY): 16 min   Charges:   PT Evaluation $PT Eval Moderate Complexity: 1 Mod          Marisa Severin, PT, DPT Acute Rehabilitation Services Secure chat preferred Office [place] 01/12/2022, 11:00 AM

## 2022-01-12 NOTE — Telephone Encounter (Signed)
Spoke with pt who states he is currently admitted at [facility] for stroke symptoms since 01/10/2022.  He states he is having difficulty getting information to confirm if he has indeed had a stroke.  He has been evaluated by a provider.  Suggested pt speak with his nurse or provider re: his concerns as it looks as if testing is not complete at this time. Pt verbalizes understanding and agrees with current plan.

## 2022-01-12 NOTE — Progress Notes (Signed)
Spoke with the pt and relative of the pt at bedside with regard to their concern of having a sitter inside the room, education was given about the importance of close observation to maintain  safety of the pt, pt and the daughter said it was mistakenly diagnosed as unsafe to self and  to others, explained that the decision was came from the recommendation of psyche doctor as reported by RN Young from AM shift,pt asked when the psyche doctor will come, and replied not aware of the exact time in AM, the daughter requested to change the sitter since all of them especially the pt was not comfortable with the sitter, discussed issue to CN Phyl, and CN Phyl claimed after she talked with the supervisor said there was no more available sitter at this time

## 2022-01-22 NOTE — Progress Notes (Signed)
Remote pacemaker transmission.   

## 2022-01-31 ENCOUNTER — Ambulatory Visit (HOSPITAL_COMMUNITY)
Admission: EM | Admit: 2022-01-31 | Discharge: 2022-01-31 | Disposition: A | Discharge status: Home / Self Care | Payer: PRIVATE HEALTH INSURANCE | Attending: Internal Medicine | Admitting: Internal Medicine

## 2022-01-31 MED ORDER — RABIES VACCINE, PCEC IM SUSR
INTRAMUSCULAR | Status: AC
  Filled 2022-01-31: qty 1

## 2022-01-31 MED ORDER — RABIES VACCINE, PCEC IM SUSR
1.0000 mL | Freq: Once | INTRAMUSCULAR | Status: AC
  Administered 2022-01-31: 1 mL via INTRAMUSCULAR

## 2022-01-31 NOTE — ED Triage Notes (Signed)
Pt presents for his #2 rabies vaccine given in the right arm IM.  Pt tolerated well and will follow up in 2-3 days for #3 rabies vaccine.

## 2022-02-07 ENCOUNTER — Telehealth: Payer: Self-pay | Admitting: Internal Medicine

## 2022-02-07 NOTE — Telephone Encounter (Signed)
Returned call to patient who states that his office is offering biometric screening tomorrow and he is wondering if fasting will be an issue. He states that he is just nervous about everything. I advised that he can certainly do the labs, but not sure what new information they will be providing since he had so much checked last month in the hospital. Pt agrees and states they will be offering this service again before end of year and he may pursue it then.

## 2022-02-07 NOTE — Telephone Encounter (Signed)
Called patient, left message to call back.  02/07/22

## 2022-02-07 NOTE — Telephone Encounter (Signed)
Patient calling to see if it its okay for him to fast for biometric screening. He is concern bout fast since he just recently had procedure. Please advise

## 2022-02-08 ENCOUNTER — Encounter: Payer: Self-pay | Admitting: Psychiatry

## 2022-02-08 ENCOUNTER — Ambulatory Visit: Payer: PRIVATE HEALTH INSURANCE | Admitting: Psychiatry

## 2022-02-08 NOTE — Progress Notes (Deleted)
   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  At his last visit, he was started on lipitor 80 mg daily. Referral to neuropsychology and vestibular therapy were placed.  Interval History: He presented to the ED on 01/10/22 with left-sided numbness. [place] x2 were negative for acute infarct. He is unable to have MRI due to PPM. CTA head/neck without LVO. Notes he missed 5-6 days of Eliquis and had not been taking Lipitor. Urine drug screen was also positive for cocaine.   Physical Exam:   Vital Signs: There were no vitals taken for this visit. GENERAL:  well appearing, in no acute distress, alert  SKIN:  Color, texture, turgor normal. No rashes or lesions HEAD:  Normocephalic/atraumatic. RESP: normal respiratory effort MSK:  No gross joint deformities.   NEUROLOGICAL: Mental Status: Alert, oriented to person, place and time, Follows commands, and Speech fluent and appropriate. Cranial Nerves: PERRL, face symmetric, no dysarthria, hearing grossly intact Motor: moves all extremities equally Gait: normal-based.  IMPRESSION: ***  PLAN: ***   Follow-up: ***  I spent a total of *** minutes on the date of the service. Discussed medication side effects, adverse reactions and drug interactions. Written educational materials and patient instructions outlining all of the above were given.  Genia Harold, MD

## 2022-03-11 ENCOUNTER — Other Ambulatory Visit: Payer: Self-pay | Admitting: Student

## 2022-04-04 ENCOUNTER — Ambulatory Visit (INDEPENDENT_AMBULATORY_CARE_PROVIDER_SITE_OTHER): Payer: PRIVATE HEALTH INSURANCE

## 2022-04-04 DIAGNOSIS — I428 Other cardiomyopathies: Secondary | ICD-10-CM

## 2022-04-05 LAB — CUP PACEART REMOTE DEVICE CHECK
Battery Remaining Longevity: 72 mo
Battery Remaining Percentage: 77 %
Battery Voltage: 2.98 V
Date Time Interrogation Session: 20231206035411
Implantable Lead Connection Status: 753985
Implantable Lead Connection Status: 753985
Implantable Lead Connection Status: 753985
Implantable Lead Implant Date: 20120228
Implantable Lead Implant Date: 20120228
Implantable Lead Implant Date: 20120228
Implantable Lead Location: 753858
Implantable Lead Location: 753859
Implantable Lead Location: 753860
Implantable Lead Model: 4196
Implantable Pulse Generator Implant Date: 20211011
Lead Channel Impedance Value: 540 Ohm
Lead Channel Impedance Value: 890 Ohm
Lead Channel Pacing Threshold Amplitude: 0.75 V
Lead Channel Pacing Threshold Amplitude: 1.25 V
Lead Channel Pacing Threshold Pulse Width: 0.5 ms
Lead Channel Pacing Threshold Pulse Width: 0.8 ms
Lead Channel Sensing Intrinsic Amplitude: 7.8 mV
Lead Channel Setting Pacing Amplitude: 2 V
Lead Channel Setting Pacing Amplitude: 2.5 V
Lead Channel Setting Pacing Pulse Width: 0.5 ms
Lead Channel Setting Pacing Pulse Width: 0.8 ms
Lead Channel Setting Sensing Sensitivity: 4 mV
Pulse Gen Model: 3222
Pulse Gen Serial Number: 3859402

## 2022-04-11 ENCOUNTER — Encounter (HOSPITAL_COMMUNITY): Payer: Self-pay | Admitting: Emergency Medicine

## 2022-04-11 ENCOUNTER — Emergency Department (HOSPITAL_COMMUNITY): Payer: PRIVATE HEALTH INSURANCE

## 2022-04-11 ENCOUNTER — Emergency Department (HOSPITAL_COMMUNITY)
Admission: EM | Admit: 2022-04-11 | Discharge: 2022-04-11 | Disposition: A | Discharge status: Home / Self Care | Payer: PRIVATE HEALTH INSURANCE | Attending: Emergency Medicine | Admitting: Emergency Medicine

## 2022-04-11 DIAGNOSIS — Z7901 Long term (current) use of anticoagulants: Secondary | ICD-10-CM | POA: Insufficient documentation

## 2022-04-11 DIAGNOSIS — N179 Acute kidney failure, unspecified: Secondary | ICD-10-CM | POA: Insufficient documentation

## 2022-04-11 DIAGNOSIS — K2900 Acute gastritis without bleeding: Secondary | ICD-10-CM | POA: Insufficient documentation

## 2022-04-11 DIAGNOSIS — I509 Heart failure, unspecified: Secondary | ICD-10-CM | POA: Insufficient documentation

## 2022-04-11 DIAGNOSIS — R112 Nausea with vomiting, unspecified: Secondary | ICD-10-CM

## 2022-04-11 LAB — BASIC METABOLIC PANEL
Anion gap: 12 (ref 5–15)
BUN: 25 mg/dL — ABNORMAL HIGH (ref 8–23)
CO2: 26 mmol/L (ref 22–32)
Calcium: 9.3 mg/dL (ref 8.9–10.3)
Chloride: 101 mmol/L (ref 98–111)
Creatinine, Ser: 1.45 mg/dL — ABNORMAL HIGH (ref 0.61–1.24)
GFR, Estimated: 54 mL/min — ABNORMAL LOW (ref >60)
Glucose, Bld: 105 mg/dL — ABNORMAL HIGH (ref 70–99)
Potassium: 5 mmol/L (ref 3.5–5.1)
Sodium: 139 mmol/L (ref 135–145)

## 2022-04-11 LAB — CBC
HCT: 52.3 % — ABNORMAL HIGH (ref 39.0–52.0)
Hemoglobin: 17.9 g/dL — ABNORMAL HIGH (ref 13.0–17.0)
MCH: 31.3 pg (ref 26.0–34.0)
MCHC: 34.2 g/dL (ref 30.0–36.0)
MCV: 91.6 fL (ref 80.0–100.0)
Platelets: 205 10*3/uL (ref 150–400)
RBC: 5.71 MIL/uL (ref 4.22–5.81)
RDW: 13.1 % (ref 11.5–15.5)
WBC: 13.8 10*3/uL — ABNORMAL HIGH (ref 4.0–10.5)
nRBC: 0 % (ref 0.0–0.2)

## 2022-04-11 LAB — TROPONIN I (HIGH SENSITIVITY)
Troponin I (High Sensitivity): 16 ng/L (ref <18)
Troponin I (High Sensitivity): 16 ng/L (ref <18)

## 2022-04-11 MED ORDER — FAMOTIDINE IN NACL 20-0.9 MG/50ML-% IV SOLN
20.0000 mg | Freq: Once | INTRAVENOUS | Status: AC
  Administered 2022-04-11: 20 mg via INTRAVENOUS
  Filled 2022-04-11: qty 50

## 2022-04-11 MED ORDER — LACTATED RINGERS IV BOLUS
500.0000 mL | Freq: Once | INTRAVENOUS | Status: AC
  Administered 2022-04-11: 500 mL via INTRAVENOUS

## 2022-04-11 MED ORDER — LIDOCAINE VISCOUS HCL 2 % MT SOLN
15.0000 mL | Freq: Once | OROMUCOSAL | Status: AC
  Administered 2022-04-11: 15 mL via ORAL
  Filled 2022-04-11: qty 15

## 2022-04-11 MED ORDER — ONDANSETRON 4 MG PO TBDP: 4.0000 mg | ORAL_TABLET | Freq: Three times a day (TID) | ORAL | 0 refills | Status: DC | PRN

## 2022-04-11 MED ORDER — ONDANSETRON HCL 4 MG/2ML IJ SOLN
4.0000 mg | Freq: Once | INTRAMUSCULAR | Status: AC
  Administered 2022-04-11: 4 mg via INTRAVENOUS
  Filled 2022-04-11: qty 2

## 2022-04-11 MED ORDER — FAMOTIDINE 20 MG PO TABS: 20.0000 mg | ORAL_TABLET | Freq: Two times a day (BID) | ORAL | 0 refills | Status: DC

## 2022-04-11 MED ORDER — ALUM & MAG HYDROXIDE-SIMETH 200-200-20 MG/5ML PO SUSP
30.0000 mL | Freq: Once | ORAL | Status: AC
  Administered 2022-04-11: 30 mL via ORAL
  Filled 2022-04-11: qty 30

## 2022-04-11 NOTE — ED Provider Notes (Signed)
[facility] EMERGENCY DEPARTMENT Provider Note   CSN: [id number] Arrival date & time: 04/11/22  1455     History  Chief Complaint  Patient presents with   Nausea   Chest Pain    Mike Bean is a 61 y.o. male.   Chest Pain Associated symptoms: nausea      61 year old male with medical history significant for nonischemic cardiomyopathy, persistent atrial fibrillation on anticoagulation, CHF who presents emergency department with an episode of epigastric sinus substernal chest burning and nausea since around 2:00 this morning that woke him up from sleep.  He endorses a mild cough.  He denies any fever, chills, shortness of breath.  He denies any syncope or near syncope. No emesis/  Home Medications Prior to Admission medications   Medication Sig Start Date End Date Taking? Authorizing Provider  famotidine (PEPCID) 20 MG tablet Take 1 tablet (20 mg total) by mouth 2 (two) times daily. 04/11/22  Yes Regan Lemming, MD  ondansetron (ZOFRAN-ODT) 4 MG disintegrating tablet Take 1 tablet (4 mg total) by mouth every 8 (eight) hours as needed for nausea or vomiting. 04/11/22  Yes Regan Lemming, MD  albuterol (PROVENTIL) (2.5 MG/3ML) 0.083% nebulizer solution Take 3 mLs (2.5 mg total) by nebulization every 6 (six) hours as needed for wheezing or shortness of breath. 06/09/19   Carmin Muskrat, MD  albuterol (VENTOLIN HFA) 108 (90 Base) MCG/ACT inhaler Inhale 2 puffs into the lungs every 6 (six) hours as needed for wheezing or shortness of breath.    [provider]  Ascorbic Acid (VITAMIN C) 1000 MG tablet Take 1,000 mg by mouth daily.    [provider]  atorvastatin (LIPITOR) 80 MG tablet Take 1 tablet (80 mg total) by mouth daily. 01/12/22   [name], Henreitta Leber, MD  cetirizine (ZYRTEC) 10 MG chewable tablet Chew 10 mg by mouth daily.    [provider]  Coenzyme Q10 (CO Q 10) 100 MG CAPS Take 100 ng by mouth daily.  Patient not taking: Reported  on 01/10/2022    [provider]  cyanocobalamin (VITAMIN B12) 500 MCG tablet Take 1,000 mcg by mouth daily.    [provider]  dapagliflozin propanediol (FARXIGA) 10 MG TABS tablet Take 1 tablet (10 mg total) by mouth daily before breakfast. 11/09/21   Evans Lance, MD  ELIQUIS 5 MG TABS tablet TAKE ONE TABLET BY MOUTH TWICE A DAY 01/04/22   Evans Lance, MD  furosemide (LASIX) 40 MG tablet Take 1 tablet (40 mg total) by mouth daily as needed (Swelling). 10/13/21 01/11/22  Baldwin Jamaica, PA-C  ibuprofen (ADVIL) 600 MG tablet Take 600 mg by mouth 3 (three) times daily. Patient not taking: Reported on 01/10/2022 10/07/21   [provider]  metoprolol succinate (TOPROL-XL) 50 MG 24 hr tablet TAKE ONE TABLET BY MOUTH DAILY 03/12/22   Evans Lance, MD  Omega-3 Fatty Acids (FISH OIL CONCENTRATE) 300 MG CAPS Take 300 mg by mouth daily.  Patient not taking: Reported on 01/10/2022    [provider]  sacubitril-valsartan (ENTRESTO) 97-103 MG Take 1 tablet by mouth 2 (two) times daily. 06/14/21   Evans Lance, MD      Allergies    Patient has no known allergies.    Review of Systems   Review of Systems  Cardiovascular:  Positive for chest pain.  Gastrointestinal:  Positive for nausea.  All other systems reviewed and are negative.   Physical Exam Updated Vital Signs  BP 137/80 (BP Location: Right Arm)   Pulse 85   Temp 98 F (36.7 C) (Oral)   Resp 19   SpO2 100%  Physical Exam Vitals and nursing note reviewed.  Constitutional:      General: He is not in acute distress.    Appearance: He is well-developed.  HENT:     Head: Normocephalic and atraumatic.  Eyes:     Conjunctiva/sclera: Conjunctivae normal.  Cardiovascular:     Rate and Rhythm: Normal rate and regular rhythm.  Pulmonary:     Effort: Pulmonary effort is normal. No respiratory distress.     Breath sounds: Normal breath sounds.  Abdominal:     Palpations: Abdomen is soft.      Tenderness: There is no abdominal tenderness.  Musculoskeletal:        General: No swelling.     Cervical back: Neck supple.  Skin:    General: Skin is warm and dry.     Capillary Refill: Capillary refill takes less than 2 seconds.  Neurological:     Mental Status: He is alert.  Psychiatric:        Mood and Affect: Mood normal.     ED Results / Procedures / Treatments   Labs (all labs ordered are listed, but only abnormal results are displayed) Labs Reviewed  BASIC METABOLIC PANEL - Abnormal; Notable for the following components:      Result Value   Glucose, Bld 105 (*)    BUN 25 (*)    Creatinine, Ser 1.45 (*)    GFR, Estimated 54 (*)    All other components within normal limits  CBC - Abnormal; Notable for the following components:   WBC 13.8 (*)    Hemoglobin 17.9 (*)    HCT 52.3 (*)    All other components within normal limits  TROPONIN I (HIGH SENSITIVITY)  TROPONIN I (HIGH SENSITIVITY)    EKG EKG Interpretation  Date/Time:  Wednesday April 11 2022 15:44:14 EST Ventricular Rate:  70 PR Interval:    QRS Duration: 118 QT Interval:  438 QTC Calculation: 473 R Axis:   -88 Text Interpretation: Ventricular-paced rhythm Biventricular pacemaker detected Abnormal ECG When compared with ECG of 10-Jan-2022 13:35, PREVIOUS ECG IS PRESENT Confirmed by Regan Lemming ([id number]) on 04/11/2022 9:16:21 PM  Radiology No results found.  Procedures Procedures    Medications Ordered in ED Medications  alum & mag hydroxide-simeth (MAALOX/MYLANTA) 200-200-20 MG/5ML suspension 30 mL (30 mLs Oral Given 04/11/22 2134)    And  lidocaine (XYLOCAINE) 2 % viscous mouth solution 15 mL (15 mLs Oral Given 04/11/22 2134)  lactated ringers bolus 500 mL (0 mLs Intravenous Stopped 04/11/22 2216)  ondansetron (ZOFRAN) injection 4 mg (4 mg Intravenous Given 04/11/22 2141)  famotidine (PEPCID) IVPB 20 mg premix (0 mg Intravenous Stopped 04/11/22 2216)    ED Course/ Medical Decision Making/  A&P                           Medical Decision Making Risk OTC drugs. Prescription drug management.    61 year old male with medical history significant for nonischemic cardiomyopathy, persistent atrial fibrillation on anticoagulation, CHF who presents emergency department with an episode of epigastric sinus substernal chest burning and nausea since around 2:00 this morning that woke him up from sleep.  He endorses a mild cough.  He denies any fever, chills, shortness of breath.  He denies any syncope or near syncope.   Vitals and telemetry  on arrival: Afebrile, not tachycardic or tachypneic, BP 127/93, saturating 100% on room air.  Pertinent exam findings include: Unremarkable, lungs clear to auscultation bilaterally, no murmurs rubs or gallops, no lower extremity edema, abdomen soft, nontender, nondistended.  Differential diagnosis includes: Most likely GERD/gastritis, considered ACS, pneumonia, pneumothorax, pulmonary embolism,pericarditis/myocarditis, PUD, musculoskeletal. HEART score of 3, lowrisk. Patient not given ASA 325 mg, not given nitroglycerin.  EKG: Ventricular paced rhythm with a rate of 70 and no evidence of acute ischemic changes, abnormal intervals, or dysrhythmia. No concerning change from prior  Lab results include: Troponins x 2 negative, BMP with evidence of an AKI with creatinine 1.45 up from a previous baseline, no electrolyte abnormality, CBC with nonspecific leukocytosis to 13.8, hemoglobin of 17.9, indication of hemoconcentration in the setting of dehydration.   Unlikely pneumonia, no cough, no leukocytosis, no fevers, CXR and exam without acute findings. Unlikely pneumothorax, no findings on  CXR. Unlikely pericarditis/myocarditis, does not fit clinical picture. Chest pain not exertional. Unlikely dissection, no pulse deficit, no tearing chest pain, no neurologic complaints.   Imaging results include: Chest x-ray without Boerhaave's, no pneumothorax, no acute  cardiac or pulmonary abnormality  Course of tx has consisted of: Patient was administered an IV fluid bolus 500 cc, IV Pepcid, IV Zofran and viscous Maalox.   Symptoms are consistent with GERD or gastritis.  Following the above interventions, his symptoms had completely resolved.  He is feeling well-appearing and requesting discharge. No lightheadedness, palpitations, or syncope/near syncope. I recommended that the patient follow-up with his PCP in the next few days to schedule a recheck of his renal function.  He agreed to orally rehydrate follow-up as needed.    Patient's clinical presentation is most consistent with GERD or gastritis.   Final Clinical Impression(s) / ED Diagnoses Final diagnoses:  Acute gastritis without hemorrhage, unspecified gastritis type  Nausea and vomiting, unspecified vomiting type  AKI (acute kidney injury) (Learned)    Rx / DC Orders ED Discharge Orders          Ordered    famotidine (PEPCID) 20 MG tablet  2 times daily        04/11/22 2236    ondansetron (ZOFRAN-ODT) 4 MG disintegrating tablet  Every 8 hours PRN        04/11/22 2236              Regan Lemming, MD 04/16/22 [phone]

## 2022-04-11 NOTE — ED Notes (Signed)
AVS reviewed with pt prior to discharge. Pt verbalizes understanding. Belongings with pt upon depart. Pt ambulatory to lobby to wait for ride.

## 2022-04-11 NOTE — ED Notes (Signed)
Mike Bean, daughter, [phone] would like to speak to RN when available

## 2022-04-11 NOTE — ED Triage Notes (Signed)
Pt reports chest burning and nausea since 2 this morning. Hx of pacemaker.

## 2022-04-11 NOTE — ED Provider Triage Note (Signed)
Emergency Medicine Provider Triage Evaluation Note  Mike Bean , a 61 y.o. male  was evaluated in triage.  Pt complains of chest burning and nausea since 2 this morning.  He also reports having a bad cough.  He states the chest pain is in the center of his chest.  Denies fever, chills, shortness of breath.  Hx significant for pacemaker, CHF, Afib, cardiomyopathy, CVA.  Review of Systems  Positive: See above Negative: See above  Physical Exam  BP (!) 127/93   Pulse 70   Temp 98.1 F (36.7 C) (Oral)   Resp 18   SpO2 100%  Gen:   Awake, no distress   Resp:  Normal effort, diffuse wheezing in all lung fields  MSK:   Moves extremities without difficulty  Other:    Medical Decision Making  Medically screening exam initiated at 3:58 PM.  Appropriate orders placed.  Mike Bean was informed that the remainder of the evaluation will be completed by another provider, this initial triage assessment does not replace that evaluation, and the importance of remaining in the ED until their evaluation is complete.     Theressa Stamps R, [place] 04/11/22 [phone]

## 2022-04-11 NOTE — Discharge Instructions (Addendum)
Please call your GP for a scheduled recheck of your renal function in a few days.  Ensure you are continue to push oral fluid resuscitation.  Touch base with your cardiologist tomorrow regarding your chest discomfort after your home pacemaker interrogation.

## 2022-04-27 NOTE — Progress Notes (Signed)
Remote pacemaker transmission.   

## 2022-06-12 ENCOUNTER — Other Ambulatory Visit: Payer: Self-pay | Admitting: Internal Medicine

## 2022-06-13 ENCOUNTER — Emergency Department (HOSPITAL_BASED_OUTPATIENT_CLINIC_OR_DEPARTMENT_OTHER): Payer: PRIVATE HEALTH INSURANCE

## 2022-06-13 ENCOUNTER — Encounter (HOSPITAL_BASED_OUTPATIENT_CLINIC_OR_DEPARTMENT_OTHER): Payer: Self-pay

## 2022-06-13 ENCOUNTER — Emergency Department (HOSPITAL_BASED_OUTPATIENT_CLINIC_OR_DEPARTMENT_OTHER)
Admission: EM | Admit: 2022-06-13 | Discharge: 2022-06-13 | Disposition: A | Discharge status: Home / Self Care | Payer: PRIVATE HEALTH INSURANCE | Attending: Emergency Medicine | Admitting: Emergency Medicine

## 2022-06-13 ENCOUNTER — Other Ambulatory Visit: Payer: Self-pay

## 2022-06-13 DIAGNOSIS — Z7901 Long term (current) use of anticoagulants: Secondary | ICD-10-CM | POA: Diagnosis not present

## 2022-06-13 DIAGNOSIS — Z79899 Other long term (current) drug therapy: Secondary | ICD-10-CM | POA: Diagnosis not present

## 2022-06-13 DIAGNOSIS — R1031 Right lower quadrant pain: Secondary | ICD-10-CM

## 2022-06-13 LAB — CBC WITH DIFFERENTIAL/PLATELET
Abs Immature Granulocytes: 0.04 10*3/uL (ref 0.00–0.07)
Basophils Absolute: 0.1 10*3/uL (ref 0.0–0.1)
Basophils Relative: 1 %
Eosinophils Absolute: 0.2 10*3/uL (ref 0.0–0.5)
Eosinophils Relative: 3 %
HCT: 46.5 % (ref 39.0–52.0)
Hemoglobin: 15.8 g/dL (ref 13.0–17.0)
Immature Granulocytes: 1 %
Lymphocytes Relative: 18 %
Lymphs Abs: 1.4 10*3/uL (ref 0.7–4.0)
MCH: 30.6 pg (ref 26.0–34.0)
MCHC: 34 g/dL (ref 30.0–36.0)
MCV: 89.9 fL (ref 80.0–100.0)
Monocytes Absolute: 0.9 10*3/uL (ref 0.1–1.0)
Monocytes Relative: 12 %
Neutro Abs: 5 10*3/uL (ref 1.7–7.7)
Neutrophils Relative %: 65 %
Platelets: 180 10*3/uL (ref 150–400)
RBC: 5.17 MIL/uL (ref 4.22–5.81)
RDW: 13.4 % (ref 11.5–15.5)
WBC: 7.7 10*3/uL (ref 4.0–10.5)
nRBC: 0 % (ref 0.0–0.2)

## 2022-06-13 LAB — URINALYSIS, ROUTINE W REFLEX MICROSCOPIC
Bacteria, UA: NONE SEEN
Bilirubin Urine: NEGATIVE
Glucose, UA: 500 mg/dL — AB
Hgb urine dipstick: NEGATIVE
Ketones, ur: NEGATIVE mg/dL
Leukocytes,Ua: NEGATIVE
Nitrite: NEGATIVE
Protein, ur: NEGATIVE mg/dL
Specific Gravity, Urine: 1.005 (ref 1.005–1.030)
pH: 6 (ref 5.0–8.0)

## 2022-06-13 LAB — COMPREHENSIVE METABOLIC PANEL
ALT: 11 U/L (ref 0–44)
AST: 14 U/L — ABNORMAL LOW (ref 15–41)
Albumin: 3.8 g/dL (ref 3.5–5.0)
Alkaline Phosphatase: 74 U/L (ref 38–126)
Anion gap: 6 (ref 5–15)
BUN: 15 mg/dL (ref 8–23)
CO2: 27 mmol/L (ref 22–32)
Calcium: 9.1 mg/dL (ref 8.9–10.3)
Chloride: 104 mmol/L (ref 98–111)
Creatinine, Ser: 0.92 mg/dL (ref 0.61–1.24)
GFR, Estimated: >60 mL/min (ref >60)
Glucose, Bld: 92 mg/dL (ref 70–99)
Potassium: 4.2 mmol/L (ref 3.5–5.1)
Sodium: 137 mmol/L (ref 135–145)
Total Bilirubin: 0.5 mg/dL (ref 0.3–1.2)
Total Protein: 6.5 g/dL (ref 6.5–8.1)

## 2022-06-13 LAB — LIPASE, BLOOD: Lipase: 43 U/L (ref 11–51)

## 2022-06-13 MED ORDER — IOHEXOL 300 MG/ML  SOLN
100.0000 mL | Freq: Once | INTRAMUSCULAR | Status: AC | PRN
  Administered 2022-06-13: 85 mL via INTRAVENOUS

## 2022-06-13 NOTE — ED Notes (Signed)
Patient removed monitoring equipment and said he did not need another set of vital signs upon discharge.  Patient alert and oriented, in NAD, seen leaving ED with a steady gait.

## 2022-06-13 NOTE — ED Provider Notes (Signed)
[facility] Provider Note   CSN: [id number] Arrival date & time: 06/13/22  1657     History {Add pertinent medical, surgical, social history, OB history to HPI:1} Chief Complaint  Patient presents with   Abdominal Pain    LRQ    Mike Bean is a 62 y.o. male.  62 year old male who presents emergency department with right groin pain.  States that he was thrusting during sexual activity on Friday when he started experiencing right lower quadrant abdominal pain.  No swelling.  No testicular pain or swelling.  No penile discharge, dysuria, or frequency.  Says that the pain has persisted and is worried about an internal hernia so he came to the emergency department for evaluation.  Is declining sexually-transmitted infection testing at this time and does not believe he was exposed to any STIs because he did not perform penetrative sex.  Says that he has been more thirsty and hungry recently.  Does report using marijuana frequently recently.  Does have a history of appendectomy.       Home Medications Prior to Admission medications   Medication Sig Start Date End Date Taking? Authorizing Provider  albuterol (PROVENTIL) (2.5 MG/3ML) 0.083% nebulizer solution Take 3 mLs (2.5 mg total) by nebulization every 6 (six) hours as needed for wheezing or shortness of breath. 06/09/19   Carmin Muskrat, MD  albuterol (VENTOLIN HFA) 108 (90 Base) MCG/ACT inhaler Inhale 2 puffs into the lungs every 6 (six) hours as needed for wheezing or shortness of breath.    [provider]  Ascorbic Acid (VITAMIN C) 1000 MG tablet Take 1,000 mg by mouth daily.    [provider]  atorvastatin (LIPITOR) 80 MG tablet Take 1 tablet (80 mg total) by mouth daily. 01/12/22   [name], Henreitta Leber, MD  cetirizine (ZYRTEC) 10 MG chewable tablet Chew 10 mg by mouth daily.    [provider]  Coenzyme Q10 (CO Q 10) 100 MG CAPS Take 100 ng by mouth daily.   Patient not taking: Reported on 01/10/2022    [provider]  cyanocobalamin (VITAMIN B12) 500 MCG tablet Take 1,000 mcg by mouth daily.    [provider]  dapagliflozin propanediol (FARXIGA) 10 MG TABS tablet Take 1 tablet (10 mg total) by mouth daily before breakfast. 11/09/21   Evans Lance, MD  ELIQUIS 5 MG TABS tablet TAKE ONE TABLET BY MOUTH TWICE A DAY 01/04/22   Evans Lance, MD  famotidine (PEPCID) 20 MG tablet Take 1 tablet (20 mg total) by mouth 2 (two) times daily. 04/11/22   Regan Lemming, MD  furosemide (LASIX) 40 MG tablet Take 1 tablet (40 mg total) by mouth daily as needed (Swelling). 10/13/21 01/11/22  Baldwin Jamaica, PA-C  ibuprofen (ADVIL) 600 MG tablet Take 600 mg by mouth 3 (three) times daily. Patient not taking: Reported on 01/10/2022 10/07/21   [provider]  metoprolol succinate (TOPROL-XL) 50 MG 24 hr tablet TAKE ONE TABLET BY MOUTH DAILY 03/12/22   Evans Lance, MD  Omega-3 Fatty Acids (FISH OIL CONCENTRATE) 300 MG CAPS Take 300 mg by mouth daily.  Patient not taking: Reported on 01/10/2022    [provider]  ondansetron (ZOFRAN-ODT) 4 MG disintegrating tablet Take 1 tablet (4 mg total) by mouth every 8 (eight) hours as needed for nausea or vomiting. 04/11/22   Regan Lemming, MD  sacubitril-valsartan (ENTRESTO) 49-51 MG TAKE ONE TABLET BY MOUTH TWICE A DAY 06/13/22  Baldwin Jamaica, PA-C  sacubitril-valsartan (ENTRESTO) 97-103 MG Take 1 tablet by mouth 2 (two) times daily. 06/14/21   Evans Lance, MD      Allergies    Patient has no known allergies.    Review of Systems   Review of Systems  Physical Exam Updated Vital Signs BP (!) 144/92 (BP Location: Right Arm)   Pulse 90   Temp 97.7 F (36.5 C)   Resp 15   SpO2 99%  Physical Exam Vitals and nursing note reviewed.  Constitutional:      General: He is not in acute distress.    Appearance: He is well-developed.     Comments: Resting comfortably in  stretcher's.  Wearing sunglasses indoors at night.  HENT:     Head: Normocephalic and atraumatic.     Right Ear: External ear normal.     Left Ear: External ear normal.     Nose: Nose normal.  Eyes:     Extraocular Movements: Extraocular movements intact.     Conjunctiva/sclera: Conjunctivae normal.     Pupils: Pupils are equal, round, and reactive to light.  Cardiovascular:     Rate and Rhythm: Normal rate and regular rhythm.     Heart sounds: Normal heart sounds.  Pulmonary:     Effort: Pulmonary effort is normal. No respiratory distress.     Breath sounds: Normal breath sounds.  Abdominal:     General: There is no distension.     Palpations: Abdomen is soft. There is no mass.     Tenderness: There is abdominal tenderness (Right inguinal canal). There is no guarding.  Genitourinary:    Testes: Normal.     Comments: No testicular swelling Musculoskeletal:     Cervical back: Normal range of motion and neck supple.     Right lower leg: No edema.     Left lower leg: No edema.  Skin:    General: Skin is warm and dry.  Neurological:     Mental Status: He is alert. Mental status is at baseline.  Psychiatric:        Mood and Affect: Mood normal.        Behavior: Behavior normal.     ED Results / Procedures / Treatments   Labs (all labs ordered are listed, but only abnormal results are displayed) Labs Reviewed  CBC WITH DIFFERENTIAL/PLATELET  COMPREHENSIVE METABOLIC PANEL  LIPASE, BLOOD  URINALYSIS, ROUTINE W REFLEX MICROSCOPIC    EKG None  Radiology No results found.  Procedures Procedures  {Document cardiac monitor, telemetry assessment procedure when appropriate:1}  Medications Ordered in ED Medications - No data to display  ED Course/ Medical Decision Making/ A&P Clinical Course as of 06/13/22 2055  Wed Jun 13, 2022  2044 CT shows possible enteritis [RP]    Clinical Course User Index [RP] Fransico Meadow, MD   {   Click here for ABCD2, HEART and  other calculatorsREFRESH Note before signing :1}                          Medical Decision Making Amount and/or Complexity of Data Reviewed Labs: ordered. Radiology: ordered.  Risk Prescription drug management.   ***  {Document critical care time when appropriate:1} {Document review of labs and clinical decision tools ie heart score, Chads2Vasc2 etc:1}  {Document your independent review of radiology images, and any outside records:1} {Document your discussion with family members, caretakers, and with consultants:1} {Document social determinants of health  affecting pt's care:1} {Document your decision making why or why not admission, treatments were needed:1} Final Clinical Impression(s) / ED Diagnoses Final diagnoses:  None    Rx / DC Orders ED Discharge Orders     None

## 2022-06-13 NOTE — ED Triage Notes (Signed)
Pt c/o LRQ abd pain onset Friday midnight. "Like it's coming back up a tube or something." Denies NVD, states it's starting to cause him labored breathing. Hx CHF, advises 15 cigarettes a day. States he feels pain is "activating heart issues"  Prescribed doxy for "palpable hernia." Advised he'd need [place], "but they just can't see me."

## 2022-06-13 NOTE — Discharge Instructions (Signed)
You were seen for your abdominal pain in the emergency department.   At home, please take Tylenol as needed for your pain.    Check your MyChart online for the results of any tests that had not resulted by the time you left the emergency department.   Follow-up with your primary doctor in 2-3 days regarding your visit.    Return immediately to the emergency department if you experience any of the following: Worsening pain, fever, vomiting, or any other concerning symptoms.    Thank you for visiting our Emergency Department. It was a pleasure taking care of you today.

## 2022-07-04 ENCOUNTER — Ambulatory Visit: Payer: PRIVATE HEALTH INSURANCE

## 2022-07-04 DIAGNOSIS — I428 Other cardiomyopathies: Secondary | ICD-10-CM | POA: Diagnosis not present

## 2022-07-04 LAB — CUP PACEART REMOTE DEVICE CHECK
Battery Remaining Longevity: 67 mo
Battery Remaining Percentage: 74 %
Battery Voltage: 2.98 V
Date Time Interrogation Session: 20240306044104
Implantable Lead Connection Status: 753985
Implantable Lead Connection Status: 753985
Implantable Lead Connection Status: 753985
Implantable Lead Implant Date: 20120228
Implantable Lead Implant Date: 20120228
Implantable Lead Implant Date: 20120228
Implantable Lead Location: 753858
Implantable Lead Location: 753859
Implantable Lead Location: 753860
Implantable Lead Model: 4196
Implantable Pulse Generator Implant Date: 20211011
Lead Channel Impedance Value: 450 Ohm
Lead Channel Impedance Value: 810 Ohm
Lead Channel Pacing Threshold Amplitude: 0.75 V
Lead Channel Pacing Threshold Amplitude: 1.25 V
Lead Channel Pacing Threshold Pulse Width: 0.5 ms
Lead Channel Pacing Threshold Pulse Width: 0.8 ms
Lead Channel Sensing Intrinsic Amplitude: 7.5 mV
Lead Channel Setting Pacing Amplitude: 2 V
Lead Channel Setting Pacing Amplitude: 2.5 V
Lead Channel Setting Pacing Pulse Width: 0.5 ms
Lead Channel Setting Pacing Pulse Width: 0.8 ms
Lead Channel Setting Sensing Sensitivity: 4 mV
Pulse Gen Model: 3222
Pulse Gen Serial Number: 3859402

## 2022-08-06 ENCOUNTER — Telehealth: Payer: Self-pay | Admitting: *Deleted

## 2022-08-06 NOTE — Telephone Encounter (Signed)
Received a fax from Karin Golden for a refill for [facility], as what was filled last in 05/2022.  I will send to Francis Dowse, PA-C, to confirm what dose pt should be on.

## 2022-08-08 MED ORDER — ENTRESTO 49-51 MG PO TABS: 1.0000 | ORAL_TABLET | Freq: Two times a day (BID) | ORAL | 2 refills | Status: DC

## 2022-08-08 NOTE — Telephone Encounter (Signed)
Call placed to pt to see what dose of Entresto he has been taking.. see notes below. Left pt a message to call back and press the option for refills.  Will send to the refill pool, as was a refill request.

## 2022-08-08 NOTE — Progress Notes (Signed)
Remote pacemaker transmission.   

## 2022-08-08 NOTE — Telephone Encounter (Signed)
Called pt and pt stated that he has never picked up entresto 97-103 mg tablets and per [facility], for pt to continue to take entresto 49-51 mg tablet and for pt to make an appointment to be seen by provider. Pts' medication was sent to pt's pharmacy as requested. Confirmation received. FYI
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
## 2022-08-23 ENCOUNTER — Encounter: Payer: Self-pay | Admitting: Cardiology

## 2022-08-23 ENCOUNTER — Ambulatory Visit: Payer: PRIVATE HEALTH INSURANCE | Attending: Cardiology | Admitting: Cardiology

## 2022-08-23 ENCOUNTER — Telehealth: Payer: Self-pay | Admitting: Internal Medicine

## 2022-08-23 VITALS — BP 110/60 | HR 71 | Ht 70.0 in | Wt 178.4 lb

## 2022-08-23 DIAGNOSIS — Z950 Presence of cardiac pacemaker: Secondary | ICD-10-CM

## 2022-08-23 DIAGNOSIS — I4821 Permanent atrial fibrillation: Secondary | ICD-10-CM

## 2022-08-23 DIAGNOSIS — R600 Localized edema: Secondary | ICD-10-CM

## 2022-08-23 DIAGNOSIS — I5022 Chronic systolic (congestive) heart failure: Secondary | ICD-10-CM

## 2022-08-23 DIAGNOSIS — M79604 Pain in right leg: Secondary | ICD-10-CM

## 2022-08-23 LAB — CUP PACEART INCLINIC DEVICE CHECK
Battery Remaining Longevity: 62 mo
Battery Voltage: 2.98 V
Brady Statistic RA Percent Paced: 0 %
Brady Statistic RV Percent Paced: 98 %
Date Time Interrogation Session: 20240425163807
Implantable Lead Connection Status: 753985
Implantable Lead Connection Status: 753985
Implantable Lead Connection Status: 753985
Implantable Lead Implant Date: 20120228
Implantable Lead Implant Date: 20120228
Implantable Lead Implant Date: 20120228
Implantable Lead Location: 753858
Implantable Lead Location: 753859
Implantable Lead Location: 753860
Implantable Lead Model: 4196
Implantable Pulse Generator Implant Date: 20211011
Lead Channel Impedance Value: 512.5 Ohm
Lead Channel Impedance Value: 850 Ohm
Lead Channel Pacing Threshold Amplitude: 0.75 V
Lead Channel Pacing Threshold Amplitude: 0.75 V
Lead Channel Pacing Threshold Amplitude: 0.75 V
Lead Channel Pacing Threshold Amplitude: 0.75 V
Lead Channel Pacing Threshold Pulse Width: 0.5 ms
Lead Channel Pacing Threshold Pulse Width: 0.5 ms
Lead Channel Pacing Threshold Pulse Width: 0.8 ms
Lead Channel Pacing Threshold Pulse Width: 0.8 ms
Lead Channel Sensing Intrinsic Amplitude: 12 mV
Lead Channel Setting Pacing Amplitude: 2 V
Lead Channel Setting Pacing Amplitude: 2.5 V
Lead Channel Setting Pacing Pulse Width: 0.5 ms
Lead Channel Setting Pacing Pulse Width: 0.8 ms
Lead Channel Setting Sensing Sensitivity: 4 mV
Pulse Gen Model: 3222
Pulse Gen Serial Number: 3859402

## 2022-08-23 MED ORDER — FUROSEMIDE 40 MG PO TABS: 40.0000 mg | ORAL_TABLET | Freq: Every day | ORAL | 0 refills | Status: DC | PRN

## 2022-08-23 NOTE — Telephone Encounter (Signed)
Per message received from [facility] Triage, Dr. Dell Ponto called back.    Dr. Dell Ponto wanted to know how Pt lasix order read.   Per Dr. Ladona Ridgel, Pt should:  Take 1 (40 mg tablet) by mouth, once daily, as needed; Taken only for leg swelling.   This was communicated to Dr. Dell Ponto.  Per Dr. Dell Ponto, Pt referred to [facility] for fluctuating HR, Max was 112, but decreased in clinic.  Pt has BiV PPM-St.Jude-Merlin, and was shared / confirmed with Dr. Dell Ponto.  Dr. Dell Ponto appreciated the call, and no follow up required at this time.

## 2022-08-23 NOTE — Patient Instructions (Signed)
Medication Instructions:   Your physician recommends that you continue on your current medications as directed. Please refer to the Current Medication list given to you today.   *If you need a refill on your cardiac medications before your next appointment, please call your pharmacy*   Lab Work:  No labs ordered today.  If you have labs (blood work) drawn today and your tests are completely normal, you will receive your results only by: MyChart Message (if you have MyChart) OR A paper copy in the mail If you have any lab test that is abnormal or we need to change your treatment, we will call you to review the results.   Testing/Procedures:  No testing ordered today.   Follow-Up: At [facility], you and your health needs are our priority.  As part of our continuing mission to provide you with exceptional heart care, we have created designated Provider Care Teams.  These Care Teams include your primary Cardiologist (physician) and Advanced Practice Providers (APPs -  Physician Assistants and Nurse Practitioners) who all work together to provide you with the care you need, when you need it.  We recommend signing up for the patient portal called "MyChart".  Sign up information is provided on this After Visit Summary.  MyChart is used to connect with patients for Virtual Visits (Telemedicine).  Patients are able to view lab/test results, encounter notes, upcoming appointments, etc.  Non-urgent messages can be sent to your provider as well.   To learn more about what you can do with MyChart, go to [email or web address].    Your next appointment:   6 month(s)  Provider:   Dr. Ladona Ridgel- [facility] office

## 2022-08-23 NOTE — Telephone Encounter (Signed)
Pt c/o medication issue:  1. Name of Medication: furosemide (LASIX) 40 MG tablet  2. How are you currently taking this medication (dosage and times per day)?   Take 1 tablet (40 mg total) by mouth daily as needed (Swelling).    3. Are you having a reaction (difficulty breathing--STAT)? No  4. What is your medication issue? Dr. Dell Ponto would like a callback to find out if pt should be taking this medication or not and clarity on how. Pt is currently in treatment center right now. Please advise.

## 2022-08-24 ENCOUNTER — Encounter: Payer: Self-pay | Admitting: Cardiology

## 2022-08-24 NOTE — Telephone Encounter (Signed)
Error

## 2022-10-03 ENCOUNTER — Ambulatory Visit: Payer: PRIVATE HEALTH INSURANCE

## 2022-10-15 ENCOUNTER — Other Ambulatory Visit: Payer: Self-pay | Admitting: Internal Medicine

## 2022-10-15 DIAGNOSIS — I4821 Permanent atrial fibrillation: Secondary | ICD-10-CM

## 2022-10-15 NOTE — Telephone Encounter (Signed)
Prescription refill request for Eliquis received. Indication: Afib  Last office visit: 08/23/22 ([facility])  Scr: 0.92 (06/13/22)  Age: 62 Weight: 80.9kg  Appropriate dose. Refill sent.

## 2022-10-26 ENCOUNTER — Telehealth: Payer: Self-pay | Admitting: Internal Medicine

## 2022-10-26 NOTE — Telephone Encounter (Signed)
  Pt is calling, he is upset.  He expressed that he has been attempting to seek assistance from Dr. Ladona Ridgel without success. He would like to understand why Dr. Ladona Ridgel implanted the pacemaker and also needs information on where he can undergo an MRI. Additionally, he is interested in knowing if Dr. Ladona Ridgel can prescribe a nebulizer. He hopes to receive a call today regarding these matters.

## 2022-10-26 NOTE — Telephone Encounter (Signed)
Returned call to patient to inform that Mike Bean is out of office until Tuesday 10/30/22. He states that the leads of his pacemaker are a different company than the manufacturer of the pacemaker itself. He says he was told by Dr Mike Bean that he would not be able to have an MRI within the [facility] because of this, but that he could go to [facility]. Patient states he tried and [facility] turned him down, as well as [facility]. He wants a clear answer of where he can get his needed MRI done.  He also wants specifically answered why Mike Bean chose to put a St Jude PPM in this past time rather than what he had previously-states he's sure there was a good reason, he's just not been told/answered. He requests to speak directly with [name] in device who he brags on immensely. Informed that I can't guarantee she will be the one to respond, but that I would send his pacemaker MRI question to device team and Mike Bean for response.

## 2022-10-29 NOTE — Telephone Encounter (Signed)
Patient has St. Jude device and MDT lead. Therefor, device is not MRI compatible. I also do not think other facilities will scan this device d/t  different system/leads. Will have to defer question about choosing the type of device to Dr. Ladona Ridgel.   Will call patient after 8 AM to update.

## 2022-10-29 NOTE — Telephone Encounter (Signed)
Called patient to inform his device was not compatible with MRI and I was awaiting on Dr. Ladona Ridgel to respond with more about his device type.   Patient began to raise his voice and yell at writer stating how upset he was with Dr. Ladona Ridgel and our office for not getting a response for his questions. Stated he has been asking Dr. Ladona Ridgel for 1-1.5 years why he chose a St. Jude device and he has not called to tell him. Patient then stated he wanted a script sent to his pharmacy for a nebulizer. Advised patient his request is in the phone note to Dr. Ladona Ridgel which he will see when he reviews his message. Patient then stated he wanted [name], RN to send him a script in. I advised patient that [name] is a nurse and not able to send a prescription in without an order from Dr. Ladona Ridgel. Did advise patient he can contact his primary care doctor who may assist with the nebulizer.  Advised patient Dr. Ladona Ridgel was in the hospital today in cases and seeing patients, but will get back to me as soon as he can. Patient continued to yell at writer and not allow me to attempt to talk. At the end of the phone call, patient stated he was taking his care elsewhere. I did attempt to apologize to patient again. Patient stated "thank you" and hung up.

## 2022-10-30 NOTE — Telephone Encounter (Signed)
[name] leads have been in for a long time. They are not MRI compatible. He could almost certainly get scanned at [facility]. He is welcome to make an appointment to discuss this with me. I have not seen him in person in 2 years. GT

## 2022-10-30 NOTE — Telephone Encounter (Signed)
Called patient to advise Dr. Lubertha Basque recommendation and offer. Offered patient to have scheduling call for an apt. Patient states he is trying to device on what to do and if decides to continue care here, he will call back for an apt. Advised patient if he has any further questions or concerns to please feel free to call. Patient thanked me for call back.

## 2022-11-11 ENCOUNTER — Emergency Department (HOSPITAL_BASED_OUTPATIENT_CLINIC_OR_DEPARTMENT_OTHER): Payer: PRIVATE HEALTH INSURANCE

## 2022-11-11 ENCOUNTER — Observation Stay (HOSPITAL_BASED_OUTPATIENT_CLINIC_OR_DEPARTMENT_OTHER)
Admission: EM | Admit: 2022-11-11 | Discharge: 2022-11-13 | Disposition: A | Discharge status: Home / Self Care | Payer: PRIVATE HEALTH INSURANCE | Attending: Internal Medicine | Admitting: Internal Medicine

## 2022-11-11 ENCOUNTER — Other Ambulatory Visit: Payer: Self-pay

## 2022-11-11 DIAGNOSIS — I509 Heart failure, unspecified: Secondary | ICD-10-CM

## 2022-11-11 DIAGNOSIS — Z720 Tobacco use: Secondary | ICD-10-CM

## 2022-11-11 DIAGNOSIS — Z950 Presence of cardiac pacemaker: Secondary | ICD-10-CM | POA: Diagnosis not present

## 2022-11-11 DIAGNOSIS — F17210 Nicotine dependence, cigarettes, uncomplicated: Secondary | ICD-10-CM | POA: Insufficient documentation

## 2022-11-11 DIAGNOSIS — Z79899 Other long term (current) drug therapy: Secondary | ICD-10-CM | POA: Diagnosis not present

## 2022-11-11 DIAGNOSIS — R200 Anesthesia of skin: Principal | ICD-10-CM

## 2022-11-11 DIAGNOSIS — I5023 Acute on chronic systolic (congestive) heart failure: Principal | ICD-10-CM | POA: Diagnosis present

## 2022-11-11 DIAGNOSIS — F1010 Alcohol abuse, uncomplicated: Secondary | ICD-10-CM

## 2022-11-11 DIAGNOSIS — J449 Chronic obstructive pulmonary disease, unspecified: Secondary | ICD-10-CM | POA: Diagnosis present

## 2022-11-11 DIAGNOSIS — Z1152 Encounter for screening for COVID-19: Secondary | ICD-10-CM | POA: Diagnosis not present

## 2022-11-11 DIAGNOSIS — G459 Transient cerebral ischemic attack, unspecified: Secondary | ICD-10-CM

## 2022-11-11 DIAGNOSIS — I110 Hypertensive heart disease with heart failure: Secondary | ICD-10-CM | POA: Insufficient documentation

## 2022-11-11 DIAGNOSIS — Z7901 Long term (current) use of anticoagulants: Secondary | ICD-10-CM | POA: Diagnosis not present

## 2022-11-11 DIAGNOSIS — R42 Dizziness and giddiness: Secondary | ICD-10-CM

## 2022-11-11 DIAGNOSIS — I480 Paroxysmal atrial fibrillation: Secondary | ICD-10-CM | POA: Insufficient documentation

## 2022-11-11 DIAGNOSIS — R600 Localized edema: Secondary | ICD-10-CM

## 2022-11-11 DIAGNOSIS — R0789 Other chest pain: Secondary | ICD-10-CM | POA: Diagnosis present

## 2022-11-11 LAB — CBC WITH DIFFERENTIAL/PLATELET
Abs Immature Granulocytes: 0.06 10*3/uL (ref 0.00–0.07)
Basophils Absolute: 0.1 10*3/uL (ref 0.0–0.1)
Basophils Relative: 1 %
Eosinophils Absolute: 0.3 10*3/uL (ref 0.0–0.5)
Eosinophils Relative: 4 %
HCT: 49.4 % (ref 39.0–52.0)
Hemoglobin: 16.6 g/dL (ref 13.0–17.0)
Immature Granulocytes: 1 %
Lymphocytes Relative: 20 %
Lymphs Abs: 1.7 10*3/uL (ref 0.7–4.0)
MCH: 29.9 pg (ref 26.0–34.0)
MCHC: 33.6 g/dL (ref 30.0–36.0)
MCV: 89 fL (ref 80.0–100.0)
Monocytes Absolute: 0.9 10*3/uL (ref 0.1–1.0)
Monocytes Relative: 11 %
Neutro Abs: 5.4 10*3/uL (ref 1.7–7.7)
Neutrophils Relative %: 63 %
Platelets: 178 10*3/uL (ref 150–400)
RBC: 5.55 MIL/uL (ref 4.22–5.81)
RDW: 13.2 % (ref 11.5–15.5)
WBC: 8.5 10*3/uL (ref 4.0–10.5)
nRBC: 0 % (ref 0.0–0.2)

## 2022-11-11 NOTE — ED Triage Notes (Addendum)
Pt presents from home for L arm and leg numbness and increased difficulty speaking, dizziness. LKW 3pm. Ambulatory in triage.  Pt endorses that he also has increased cough that has been present for 2 weeks.   Takes eliquis  H/o CHF, pacemaker, veritgo, CVA (similar sx on L side), cocaine use

## 2022-11-12 ENCOUNTER — Other Ambulatory Visit: Payer: Self-pay | Admitting: Internal Medicine

## 2022-11-12 ENCOUNTER — Emergency Department (HOSPITAL_BASED_OUTPATIENT_CLINIC_OR_DEPARTMENT_OTHER): Payer: PRIVATE HEALTH INSURANCE

## 2022-11-12 ENCOUNTER — Observation Stay (HOSPITAL_COMMUNITY): Payer: PRIVATE HEALTH INSURANCE

## 2022-11-12 DIAGNOSIS — I5023 Acute on chronic systolic (congestive) heart failure: Secondary | ICD-10-CM | POA: Diagnosis not present

## 2022-11-12 DIAGNOSIS — Z950 Presence of cardiac pacemaker: Secondary | ICD-10-CM | POA: Diagnosis not present

## 2022-11-12 DIAGNOSIS — F17210 Nicotine dependence, cigarettes, uncomplicated: Secondary | ICD-10-CM | POA: Diagnosis not present

## 2022-11-12 DIAGNOSIS — I5021 Acute systolic (congestive) heart failure: Secondary | ICD-10-CM

## 2022-11-12 DIAGNOSIS — R200 Anesthesia of skin: Secondary | ICD-10-CM

## 2022-11-12 DIAGNOSIS — I509 Heart failure, unspecified: Secondary | ICD-10-CM

## 2022-11-12 DIAGNOSIS — Z79899 Other long term (current) drug therapy: Secondary | ICD-10-CM | POA: Diagnosis not present

## 2022-11-12 DIAGNOSIS — I480 Paroxysmal atrial fibrillation: Secondary | ICD-10-CM | POA: Diagnosis not present

## 2022-11-12 DIAGNOSIS — I110 Hypertensive heart disease with heart failure: Secondary | ICD-10-CM | POA: Diagnosis not present

## 2022-11-12 DIAGNOSIS — R0789 Other chest pain: Secondary | ICD-10-CM | POA: Diagnosis present

## 2022-11-12 DIAGNOSIS — Z1152 Encounter for screening for COVID-19: Secondary | ICD-10-CM | POA: Diagnosis not present

## 2022-11-12 DIAGNOSIS — Z7901 Long term (current) use of anticoagulants: Secondary | ICD-10-CM | POA: Diagnosis not present

## 2022-11-12 LAB — URINALYSIS, ROUTINE W REFLEX MICROSCOPIC
Bacteria, UA: NONE SEEN
Bilirubin Urine: NEGATIVE
Glucose, UA: >1000 mg/dL — AB
Hgb urine dipstick: NEGATIVE
Ketones, ur: NEGATIVE mg/dL
Leukocytes,Ua: NEGATIVE
Nitrite: NEGATIVE
Protein, ur: NEGATIVE mg/dL
Specific Gravity, Urine: 1.02 (ref 1.005–1.030)
pH: 6 (ref 5.0–8.0)

## 2022-11-12 LAB — ETHANOL: Alcohol, Ethyl (B): <10 mg/dL (ref <10)

## 2022-11-12 LAB — RESP PANEL BY RT-PCR (RSV, FLU A&B, COVID)  RVPGX2
Influenza A by PCR: NEGATIVE
Influenza B by PCR: NEGATIVE
Resp Syncytial Virus by PCR: NEGATIVE
SARS Coronavirus 2 by RT PCR: NEGATIVE

## 2022-11-12 LAB — TROPONIN I (HIGH SENSITIVITY)
Troponin I (High Sensitivity): 11 ng/L (ref <18)
Troponin I (High Sensitivity): 12 ng/L (ref <18)

## 2022-11-12 LAB — ECHOCARDIOGRAM COMPLETE
AR max vel: 3.01 cm2
AV Peak grad: 6.2 mmHg
Ao pk vel: 1.24 m/s
Height: 70 in
S' Lateral: 3.45 cm
Weight: 3058.22 oz

## 2022-11-12 LAB — RAPID URINE DRUG SCREEN, HOSP PERFORMED
Amphetamines: NOT DETECTED
Barbiturates: NOT DETECTED
Benzodiazepines: NOT DETECTED
Cocaine: NOT DETECTED
Opiates: NOT DETECTED
Tetrahydrocannabinol: NOT DETECTED

## 2022-11-12 LAB — BASIC METABOLIC PANEL
Anion gap: 9 (ref 5–15)
BUN: 21 mg/dL (ref 8–23)
CO2: 27 mmol/L (ref 22–32)
Calcium: 8.8 mg/dL — ABNORMAL LOW (ref 8.9–10.3)
Chloride: 100 mmol/L (ref 98–111)
Creatinine, Ser: 1.19 mg/dL (ref 0.61–1.24)
GFR, Estimated: >60 mL/min (ref >60)
Glucose, Bld: 149 mg/dL — ABNORMAL HIGH (ref 70–99)
Potassium: 4.1 mmol/L (ref 3.5–5.1)
Sodium: 136 mmol/L (ref 135–145)

## 2022-11-12 LAB — I-STAT ARTERIAL BLOOD GAS, ED
Acid-Base Excess: 5 mmol/L — ABNORMAL HIGH (ref 0.0–2.0)
Bicarbonate: 29.4 mmol/L — ABNORMAL HIGH (ref 20.0–28.0)
Calcium, Ion: 1.16 mmol/L (ref 1.15–1.40)
HCT: 48 % (ref 39.0–52.0)
Hemoglobin: 16.3 g/dL (ref 13.0–17.0)
O2 Saturation: 96 %
Patient temperature: 98.1
Potassium: 3.9 mmol/L (ref 3.5–5.1)
Sodium: 135 mmol/L (ref 135–145)
TCO2: 31 mmol/L (ref 22–32)
pCO2 arterial: 42.1 mmHg (ref 32–48)
pH, Arterial: 7.451 — ABNORMAL HIGH (ref 7.35–7.45)
pO2, Arterial: 81 mmHg — ABNORMAL LOW (ref 83–108)

## 2022-11-12 LAB — VITAMIN B12: Vitamin B-12: 318 pg/mL (ref 180–914)

## 2022-11-12 LAB — BRAIN NATRIURETIC PEPTIDE: B Natriuretic Peptide: 61.8 pg/mL (ref 0.0–100.0)

## 2022-11-12 MED ORDER — ALBUTEROL SULFATE HFA 108 (90 BASE) MCG/ACT IN AERS: 2.0000 | INHALATION_SPRAY | Freq: Four times a day (QID) | RESPIRATORY_TRACT | Status: DC | PRN

## 2022-11-12 MED ORDER — MIDODRINE HCL 5 MG PO TABS
5.0000 mg | ORAL_TABLET | ORAL | Status: AC
  Administered 2022-11-12: 5 mg via ORAL
  Filled 2022-11-12: qty 1

## 2022-11-12 MED ORDER — ACETAMINOPHEN 325 MG PO TABS: 650.0000 mg | ORAL_TABLET | Freq: Four times a day (QID) | ORAL | Status: DC | PRN

## 2022-11-12 MED ORDER — ALBUTEROL SULFATE (2.5 MG/3ML) 0.083% IN NEBU: 2.5000 mg | INHALATION_SOLUTION | Freq: Four times a day (QID) | RESPIRATORY_TRACT | Status: DC | PRN

## 2022-11-12 MED ORDER — LORATADINE 10 MG PO TABS
10.0000 mg | ORAL_TABLET | Freq: Every day | ORAL | Status: DC
  Administered 2022-11-12 – 2022-11-13 (×2): 10 mg via ORAL
  Filled 2022-11-12 (×2): qty 1

## 2022-11-12 MED ORDER — VITAMIN B-12 1000 MCG PO TABS
1000.0000 ug | ORAL_TABLET | Freq: Every day | ORAL | Status: DC
  Administered 2022-11-12 – 2022-11-13 (×2): 1000 ug via ORAL
  Filled 2022-11-12 (×2): qty 1

## 2022-11-12 MED ORDER — FUROSEMIDE 10 MG/ML IJ SOLN
40.0000 mg | Freq: Once | INTRAMUSCULAR | Status: AC
  Administered 2022-11-12: 40 mg via INTRAVENOUS
  Filled 2022-11-12: qty 4

## 2022-11-12 MED ORDER — ATORVASTATIN CALCIUM 80 MG PO TABS
80.0000 mg | ORAL_TABLET | Freq: Every day | ORAL | Status: DC
  Administered 2022-11-13: 80 mg via ORAL
  Filled 2022-11-12 (×2): qty 1

## 2022-11-12 MED ORDER — IOHEXOL 350 MG/ML SOLN
100.0000 mL | Freq: Once | INTRAVENOUS | Status: AC | PRN
  Administered 2022-11-12: 75 mL via INTRAVENOUS

## 2022-11-12 MED ORDER — SACUBITRIL-VALSARTAN 49-51 MG PO TABS
1.0000 | ORAL_TABLET | Freq: Two times a day (BID) | ORAL | Status: DC
  Administered 2022-11-12 – 2022-11-13 (×3): 1 via ORAL
  Filled 2022-11-12 (×4): qty 1

## 2022-11-12 MED ORDER — FOLIC ACID 1 MG PO TABS
1.0000 mg | ORAL_TABLET | Freq: Every day | ORAL | Status: DC
  Administered 2022-11-12 – 2022-11-13 (×2): 1 mg via ORAL
  Filled 2022-11-12 (×2): qty 1

## 2022-11-12 MED ORDER — THIAMINE MONONITRATE 100 MG PO TABS
100.0000 mg | ORAL_TABLET | Freq: Every day | ORAL | Status: DC
  Administered 2022-11-12 – 2022-11-13 (×2): 100 mg via ORAL
  Filled 2022-11-12 (×2): qty 1

## 2022-11-12 MED ORDER — ORAL CARE MOUTH RINSE: 15.0000 mL | OROMUCOSAL | Status: DC | PRN

## 2022-11-12 MED ORDER — APIXABAN 5 MG PO TABS
5.0000 mg | ORAL_TABLET | Freq: Two times a day (BID) | ORAL | Status: DC
  Administered 2022-11-12 – 2022-11-13 (×3): 5 mg via ORAL
  Filled 2022-11-12 (×3): qty 1

## 2022-11-12 MED ORDER — PROCHLORPERAZINE EDISYLATE 10 MG/2ML IJ SOLN: 5.0000 mg | Freq: Four times a day (QID) | INTRAMUSCULAR | Status: DC | PRN

## 2022-11-12 MED ORDER — NICOTINE 21 MG/24HR TD PT24
21.0000 mg | MEDICATED_PATCH | Freq: Every day | TRANSDERMAL | Status: DC
  Filled 2022-11-12 (×2): qty 1

## 2022-11-12 MED ORDER — POLYETHYLENE GLYCOL 3350 17 G PO PACK: 17.0000 g | PACK | Freq: Every day | ORAL | Status: DC | PRN

## 2022-11-12 MED ORDER — FUROSEMIDE 10 MG/ML IJ SOLN
20.0000 mg | Freq: Two times a day (BID) | INTRAMUSCULAR | Status: AC
  Administered 2022-11-12 (×2): 20 mg via INTRAVENOUS
  Filled 2022-11-12 (×2): qty 2

## 2022-11-12 MED ORDER — MELATONIN 5 MG PO TABS: 5.0000 mg | ORAL_TABLET | Freq: Every evening | ORAL | Status: DC | PRN

## 2022-11-12 NOTE — Evaluation (Signed)
Occupational Therapy Evaluation Patient Details Name: Mike Bean MRN: [id number] DOB: 03/08/1961 Today's Date: 11/12/2022   History of Present Illness 62 y.o. 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 ED with complaints of 1 day of shortness of breath.  Associated with chest discomfort and a nonproductive cough.  Endorses intermittent dizziness and lightheadedness. Reports left side numbness.   Clinical Impression   Patient evaluated by Occupational Therapy with no further acute OT needs identified. All education has been completed and the patient has no further questions. Prior to admit, pt reports living alone and is independent with all ADL tasks and functional mobility. 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 Pt provided with HEP for bilateral hand strength including tan putty and green foam resistance block. No follow-up Occupational Therapy or equipment needs. OT is signing off. Thank you for this referral.       Recommendations for follow up therapy are one component of a multi-disciplinary discharge planning process, led by the attending physician.  Recommendations may be updated based on patient status, additional functional criteria and insurance authorization.   Assistance Recommended at Discharge None  Patient can return home with the following Assist for transportation    Functional Status Assessment  Patient has not had a recent decline in their functional status  Equipment Recommendations  None recommended by OT       Precautions / Restrictions Precautions Precautions: None Restrictions Weight Bearing Restrictions: No      Mobility Bed Mobility    General bed mobility comments: sitting on EOB upon therapy  arrival.            ADL either performed or assessed with clinical judgement   ADL Overall ADL's : Independent;Modified independent           Vision Baseline Vision/History: 0 No visual deficits Ability to See in Adequate Light: 0 Adequate Patient Visual Report: No change from baseline Vision Assessment?: No apparent visual deficits            Pertinent Vitals/Pain Pain Assessment Pain Assessment: Faces Faces Pain Scale: Hurts a little bit Pain Location: left hand when making a tight fist. Reports that the joints hurt versus muscle pain. Pain Descriptors / Indicators: Discomfort Pain Intervention(s): Monitored during session     Hand Dominance Right   Extremity/Trunk Assessment Upper Extremity Assessment Upper Extremity Assessment: RUE deficits/detail;LUE deficits/detail RUE Deficits / Details: A/ROM is WNL. decreased gross grasp noted RUE Sensation: WNL RUE Coordination: WNL LUE Deficits / Details: A/ROM is WNL. decreased gross grasp noted. LUE Sensation: WNL LUE Coordination: WNL   Lower Extremity Assessment Lower Extremity Assessment: Overall WFL for tasks assessed   Cervical / Trunk Assessment Cervical / Trunk Assessment: Normal   Communication Communication Communication: No difficulties   Cognition Arousal/Alertness: Awake/alert Behavior During Therapy: WFL for tasks assessed/performed Overall Cognitive Status: Within Functional Limits for tasks assessed                 Exercises Other Exercises Other Exercises: Provided hand strength HEP including tan putty and green foam resistance block. HEP included squeeze/release, lateral pinch, and finger tip pinch, complete with BUE.   Shoulder Instructions      Home Living Family/patient expects to be discharged to:: Private residence Living Arrangements: Alone Available Help at Discharge:  Family;Available PRN/intermittently Type of Home: House       Prior Functioning/Environment Prior Level  of Function : Independent/Modified Independent;Driving      Mobility Comments: independent ADLs Comments: independent        OT Problem List: Decreased strength;Pain;Impaired UE functional use      OT Treatment/Interventions:   theract   OT Goals(Current goals can be found in the care plan section) Acute Rehab OT Goals Patient Stated Goal: to decrease hand pain  OT Frequency:  1X visit       AM-PAC OT "6 Clicks" Daily Activity     Outcome Measure Help from another person eating meals?: None Help from another person taking care of personal grooming?: None Help from another person toileting, which includes using toliet, bedpan, or urinal?: None Help from another person bathing (including washing, rinsing, drying)?: None Help from another person to put on and taking off regular upper body clothing?: None Help from another person to put on and taking off regular lower body clothing?: None 6 Click Score: 24   End of Session Nurse Communication: Mobility status  Activity Tolerance: Patient tolerated treatment well Patient left: in bed;with call bell/phone within reach;with bed alarm set  OT Visit Diagnosis: Muscle weakness (generalized) (M62.81)                Time: 7846-9629 OT Time Calculation (min): 37 min Charges:  OT General Charges $OT Visit: 1 Visit OT Evaluation $OT Eval Moderate Complexity: 1 Mod OT Treatments $Therapeutic Activity: 8-22 mins  Limmie Patricia, OTR/L,CBIS  Supplemental OT - [facility] and [facility] Secure Chat Preferred    Shayonna Ocampo, Charisse March 11/12/2022, 4:02 PM

## 2022-11-12 NOTE — Progress Notes (Signed)
PT Cancellation Note  Patient Details Name: Mike Bean MRN: [id number] DOB: 10/04/1960   Cancelled Treatment:    Reason Eval/Treat Not Completed: Other (comment) - pt declines until he sees MD  Marye Round, PT DPT Acute Rehabilitation Services Secure Chat Preferred  Office [phone]    Yue Glasheen Sheliah Plane 11/12/2022, 10:07 AM

## 2022-11-12 NOTE — ED Provider Notes (Signed)
[place] EMERGENCY DEPARTMENT AT [facility] Provider Note   CSN: [id number] Arrival date & time: 11/11/22  2320     History  Chief Complaint  Patient presents with   Numbness    Mike Bean is a 62 y.o. male.  The history is provided by the patient.  Chest Pain Pain location:  Substernal area Pain quality: dull   Pain radiates to:  Does not radiate Pain severity:  Moderate Onset quality:  Gradual Duration: 5 hours. Timing:  Constant Progression:  Unchanged Context: at rest   Relieved by:  Nothing Worsened by:  Nothing Ineffective treatments:  None tried Associated symptoms: cough, dizziness, nausea, numbness and shortness of breath   Associated symptoms: no fever, no lower extremity edema and no weakness   Dizziness:    Severity:  Moderate   Dizziness duration: hours is light headed and near syncopal.   Timing:  Constant   Progression:  Unchanged Patient with stroke and CHF presents with chest pain and left sided numbness since this evening.  He also feels near syncopal.  Patient with associated SOB and nausea.      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 Authorizing Provider  albuterol (PROVENTIL) (2.5 MG/3ML) 0.083% nebulizer solution Take 3 mLs (2.5 mg total) by nebulization every 6 (six) hours as needed for wheezing or shortness of breath. 06/09/19   Gerhard Munch, MD  albuterol (VENTOLIN HFA) 108 (90 Base) MCG/ACT inhaler Inhale 2 puffs into the lungs every 6 (six) hours as needed for wheezing or shortness of breath.    [provider]  apixaban (ELIQUIS) 5 MG TABS tablet TAKE 1 TABLET BY MOUTH TWICE A DAY  10/15/22   Marinus Maw, MD  Ascorbic Acid (VITAMIN C) 1000 MG tablet Take 1,000 mg by mouth daily.    [provider]  atorvastatin (LIPITOR) 80 MG tablet Take 1 tablet (80 mg total) by mouth daily. 01/12/22   [name], Werner Lean, MD  cetirizine (ZYRTEC) 10 MG chewable tablet Chew 10 mg by mouth daily.    [provider]  Coenzyme Q10 (CO Q 10) 100 MG CAPS Take 100 ng by mouth daily.    [provider]  cyanocobalamin (VITAMIN B12) 500 MCG tablet Take 1,000 mcg by mouth daily.    [provider]  dapagliflozin propanediol (FARXIGA) 10 MG TABS tablet Take 1 tablet (10 mg total) by mouth daily before breakfast. 11/09/21   Marinus Maw, MD  furosemide (LASIX) 40 MG tablet Take 1 tablet (40 mg total) by mouth daily as needed (Swelling). Take only for Leg swelling, once daily. 08/23/22 11/21/22  Marinus Maw, MD  ibuprofen (ADVIL) 600 MG tablet Take 600 mg by mouth 3 (three) times daily. 10/07/21   [provider]  Omega-3 Fatty Acids (FISH OIL CONCENTRATE) 300 MG CAPS Take 300 mg by mouth daily.    [provider]  ondansetron (ZOFRAN-ODT) 4 MG disintegrating tablet Take 1 tablet (4 mg total) by mouth every 8 (eight) hours as needed for nausea or vomiting. 04/11/22   Ernie Avena, MD  sacubitril-valsartan (ENTRESTO) 49-51 MG Take  1 tablet by mouth 2 (two) times daily. 08/08/22   Marinus Maw, MD      Allergies    Patient has no known allergies.    Review of Systems   Review of Systems  Constitutional:  Negative for fever.  HENT:  Negative for facial swelling.   Eyes:  Negative for redness.  Respiratory:  Positive for cough and shortness of breath.   Cardiovascular:  Positive for chest pain.  Gastrointestinal:  Positive for nausea.  Neurological:  Positive for dizziness, light-headedness and numbness. Negative for speech difficulty and weakness.  Psychiatric/Behavioral:  Negative for agitation.   All other systems reviewed and are  negative.   Physical Exam Updated Vital Signs BP (!) 112/100   Pulse 72   Temp 98.1 F (36.7 C) (Oral)   Resp 20   SpO2 94%  Physical Exam Vitals and nursing note reviewed.  Constitutional:      General: He is not in acute distress.    Appearance: He is well-developed. He is not diaphoretic.  HENT:     Head: Normocephalic and atraumatic.     Nose: Nose normal.     Mouth/Throat:     Mouth: Mucous membranes are moist.     Pharynx: Oropharynx is clear.  Eyes:     Extraocular Movements: Extraocular movements intact.     Conjunctiva/sclera: Conjunctivae normal.     Pupils: Pupils are equal, round, and reactive to light.  Cardiovascular:     Rate and Rhythm: Normal rate. Rhythm irregular.     Pulses: Normal pulses.     Heart sounds: Normal heart sounds.  Pulmonary:     Effort: Pulmonary effort is normal.     Breath sounds: Rales present. No wheezing.  Abdominal:     General: Bowel sounds are normal.     Palpations: Abdomen is soft.     Tenderness: There is no abdominal tenderness. There is no guarding or rebound.  Musculoskeletal:        General: Normal range of motion.     Cervical back: Normal range of motion and neck supple.     Right lower leg: Edema present.     Left lower leg: Edema present.  Skin:    General: Skin is warm and dry.     Capillary Refill: Capillary refill takes less than 2 seconds.  Neurological:     General: No focal deficit present.     Mental Status: He is alert and oriented to person, place, and time.     Cranial Nerves: No cranial nerve deficit.     Sensory: No sensory deficit.     Motor: No weakness.     Deep Tendon Reflexes: Reflexes normal.  Psychiatric:        Mood and Affect: Mood normal.        Behavior: Behavior normal.     ED Results / Procedures / Treatments   Labs (all labs ordered are listed, but only abnormal results are displayed) Results for orders placed or performed during the hospital encounter of 11/11/22  Resp panel  by RT-PCR (RSV, Flu A&B, Covid) Anterior Nasal Swab   Specimen: Anterior Nasal Swab  Result Value Ref Range   SARS Coronavirus 2 by RT PCR NEGATIVE NEGATIVE   Influenza A by PCR NEGATIVE NEGATIVE   Influenza B by PCR NEGATIVE NEGATIVE   Resp Syncytial Virus by PCR NEGATIVE NEGATIVE  CBC with Differential  Result Value Ref Range   WBC 8.5 4.0 - 10.5 K/uL  RBC 5.55 4.22 - 5.81 MIL/uL   Hemoglobin 16.6 13.0 - 17.0 g/dL   HCT 03.5 00.9 - 38.1 %   MCV 89.0 80.0 - 100.0 fL   MCH 29.9 26.0 - 34.0 pg   MCHC 33.6 30.0 - 36.0 g/dL   RDW 82.9 93.7 - 16.9 %   Platelets 178 150 - 400 K/uL   nRBC 0.0 0.0 - 0.2 %   Neutrophils Relative % 63 %   Neutro Abs 5.4 1.7 - 7.7 K/uL   Lymphocytes Relative 20 %   Lymphs Abs 1.7 0.7 - 4.0 K/uL   Monocytes Relative 11 %   Monocytes Absolute 0.9 0.1 - 1.0 K/uL   Eosinophils Relative 4 %   Eosinophils Absolute 0.3 0.0 - 0.5 K/uL   Basophils Relative 1 %   Basophils Absolute 0.1 0.0 - 0.1 K/uL   Immature Granulocytes 1 %   Abs Immature Granulocytes 0.06 0.00 - 0.07 K/uL  Basic metabolic panel  Result Value Ref Range   Sodium 136 135 - 145 mmol/L   Potassium 4.1 3.5 - 5.1 mmol/L   Chloride 100 98 - 111 mmol/L   CO2 27 22 - 32 mmol/L   Glucose, Bld 149 (H) 70 - 99 mg/dL   BUN 21 8 - 23 mg/dL   Creatinine, Ser 6.78 0.61 - 1.24 mg/dL   Calcium 8.8 (L) 8.9 - 10.3 mg/dL   GFR, Estimated >93 >81 mL/min   Anion gap 9 5 - 15  Rapid urine drug screen (hospital performed)  Result Value Ref Range   Opiates NONE DETECTED NONE DETECTED   Cocaine NONE DETECTED NONE DETECTED   Benzodiazepines NONE DETECTED NONE DETECTED   Amphetamines NONE DETECTED NONE DETECTED   Tetrahydrocannabinol NONE DETECTED NONE DETECTED   Barbiturates NONE DETECTED NONE DETECTED  Ethanol  Result Value Ref Range   Alcohol, Ethyl (B) <10 <10 mg/dL  Urinalysis, Routine w reflex microscopic -Urine, Clean Catch  Result Value Ref Range   Color, Urine YELLOW YELLOW   APPearance CLEAR  CLEAR   Specific Gravity, Urine 1.020 1.005 - 1.030   pH 6.0 5.0 - 8.0   Glucose, UA >1,000 (A) NEGATIVE mg/dL   Hgb urine dipstick NEGATIVE NEGATIVE   Bilirubin Urine NEGATIVE NEGATIVE   Ketones, ur NEGATIVE NEGATIVE mg/dL   Protein, ur NEGATIVE NEGATIVE mg/dL   Nitrite NEGATIVE NEGATIVE   Leukocytes,Ua NEGATIVE NEGATIVE   RBC / HPF 0-5 0 - 5 RBC/hpf   WBC, UA 0-5 0 - 5 WBC/hpf   Bacteria, UA NONE SEEN NONE SEEN   Squamous Epithelial / HPF 0-5 0 - 5 /HPF  Brain natriuretic peptide  Result Value Ref Range   B Natriuretic Peptide 61.8 0.0 - 100.0 pg/mL  I-Stat arterial blood gas, ED ([facility], [facility], [facility])  Result Value Ref Range   pH, Arterial 7.451 (H) 7.35 - 7.45   pCO2 arterial 42.1 32 - 48 mmHg   pO2, Arterial 81 (L) 83 - 108 mmHg   Bicarbonate 29.4 (H) 20.0 - 28.0 mmol/L   TCO2 31 22 - 32 mmol/L   O2 Saturation 96 %   Acid-Base Excess 5.0 (H) 0.0 - 2.0 mmol/L   Sodium 135 135 - 145 mmol/L   Potassium 3.9 3.5 - 5.1 mmol/L   Calcium, Ion 1.16 1.15 - 1.40 mmol/L   HCT 48.0 39.0 - 52.0 %   Hemoglobin 16.3 13.0 - 17.0 g/dL   Patient temperature 01.7 F    Sample type ARTERIAL   Troponin I (  High Sensitivity)  Result Value Ref Range   Troponin I (High Sensitivity) 12 <18 ng/L   CT ANGIO HEAD NECK W WO CM  Result Date: 11/12/2022 CLINICAL DATA:  Left arm and leg numbness, difficulty speaking, dizziness EXAM: CT ANGIOGRAPHY HEAD AND NECK WITH AND WITHOUT CONTRAST TECHNIQUE: Multidetector CT imaging of the head and neck was performed using the standard protocol during bolus administration of intravenous contrast. Multiplanar CT image reconstructions and MIPs were obtained to evaluate the vascular anatomy. Carotid stenosis measurements (when applicable) are obtained utilizing NASCET criteria, using the distal internal carotid diameter as the denominator. RADIATION DOSE REDUCTION: This exam was performed according to the departmental dose-optimization program which includes automated  exposure control, adjustment of the mA and/or kV according to patient size and/or use of iterative reconstruction technique. CONTRAST:  75mL OMNIPAQUE IOHEXOL 350 MG/ML SOLN COMPARISON:  01/10/2022 CTA head and neck FINDINGS: CT HEAD FINDINGS Brain: No evidence of acute infarct, hemorrhage, mass, mass effect, or midline shift. No hydrocephalus or extra-axial fluid collection. Chronic left parietal infarct. Periventricular white matter changes, likely the sequela of chronic small vessel ischemic disease. Vascular: No hyperdense vessel. Skull: Negative for fracture or focal lesion. Sinuses/Orbits: No acute finding. Other: The mastoid air cells are well aerated. CTA NECK FINDINGS Aortic arch: Standard branching. Imaged portion shows no evidence of aneurysm or dissection. No significant stenosis of the major arch vessel origins. Aortic atherosclerosis. Right carotid system: No evidence of dissection, occlusion, or hemodynamically significant stenosis (greater than 50%). Atherosclerotic disease at the bifurcation and in the proximal ICA is not hemodynamically significant. Left carotid system: No evidence of dissection, occlusion, or hemodynamically significant stenosis (greater than 50%). Atherosclerotic disease at the bifurcation and in the proximal ICA is not hemodynamically significant. Vertebral arteries: No evidence of dissection, occlusion, or hemodynamically significant stenosis (greater than 50%). Skeleton: No acute osseous abnormality. Degenerative changes in the cervical spine. Other neck: No acute finding. Upper chest: Centrilobular and paraseptal emphysema. Review of the MIP images confirms the above findings CTA HEAD FINDINGS Anterior circulation: Both internal carotid arteries are patent to the termini, without significant stenosis. A1 segments patent. Normal anterior communicating artery. Azygous A2. Anterior cerebral arteries are patent to their distal aspects without significant stenosis. No M1 stenosis  or occlusion. MCA branches are irregular but perfused to their distal aspects without significant stenosis. Posterior circulation: Vertebral arteries patent to the vertebrobasilar junction without significant stenosis. Posterior inferior cerebellar arteries patent proximally. Basilar is irregular but patent to its distal aspect without significant stenosis. Superior cerebellar arteries patent proximally. Patent P1 segments, diminutive on the left. Near fetal origin of the left PCA from the left posterior communicating artery. The right posterior communicating artery is also patent. PCAs are irregular but perfused to their distal aspects without significant stenosis. Venous sinuses: As permitted by contrast timing, patent. Anatomic variants: None significant. Review of the MIP images confirms the above findings IMPRESSION: 1. No acute intracranial process. Chronic left parietal infarct. 2. No intracranial large vessel occlusion or significant stenosis. 3. No hemodynamically significant stenosis in the neck. 4. Aortic atherosclerosis and emphysema. Aortic Atherosclerosis (ICD10-I70.0) and Emphysema (ICD10-J43.9). Electronically Signed   By: Wiliam Ke M.D.   On: 11/12/2022 01:20   DG Chest Portable 1 View  Result Date: 11/12/2022 CLINICAL DATA:  Left upper and lower extremity numbness and difficulty speaking. EXAM: PORTABLE CHEST 1 VIEW COMPARISON:  PA Lat 04/11/2022 FINDINGS: A left chest dual lead pacing system with AID wiring is again noted, stable. Mild  cardiomegaly. The central vessels are slightly more prominent than previously. There are chronic interstitial changes of the lungs superimposed on mild emphysematous disease, but there are increased interstitial markings and haziness in the lower lung fields today. This could be due to interstitial pneumonitis or interstitial edema. The mediastinum is stable with mild aortic calcification and tortuosity. Clinical correlation and radiographic follow-up  recommended. The mid and upper lung fields remain clear with COPD. There is no substantial pleural effusion. Thoracic spondylosis with no new osseous findings. IMPRESSION: 1. Increased interstitial markings and haziness in the lower lung fields today which could be due to interstitial pneumonitis or interstitial edema. 2. Mild cardiomegaly. The central vessels are slightly more prominent than previously but there is no flow cephalization. 3. COPD changes with no other acute findings. 4. Left chest dual lead pacing system. 5. Aortic atherosclerosis. Electronically Signed   By: Almira Bar M.D.   On: 11/12/2022 00:25    EKG EKG Interpretation Date/Time:  Sunday November 11 2022 23:37:19 EDT Ventricular Rate:  73 PR Interval:    QRS Duration:  128 QT Interval:  454 QTC Calculation: 500 R Axis:   269  Text Interpretation: Ventricular-paced rhythm with occasional Premature ventricular complexes Biventricular pacemaker detected Confirmed by Nicanor Alcon, Cecil Bixby ([id number]) on 11/11/2022 11:41:52 PM  Radiology CT ANGIO HEAD NECK W WO CM  Result Date: 11/12/2022 CLINICAL DATA:  Left arm and leg numbness, difficulty speaking, dizziness EXAM: CT ANGIOGRAPHY HEAD AND NECK WITH AND WITHOUT CONTRAST TECHNIQUE: Multidetector CT imaging of the head and neck was performed using the standard protocol during bolus administration of intravenous contrast. Multiplanar CT image reconstructions and MIPs were obtained to evaluate the vascular anatomy. Carotid stenosis measurements (when applicable) are obtained utilizing NASCET criteria, using the distal internal carotid diameter as the denominator. RADIATION DOSE REDUCTION: This exam was performed according to the departmental dose-optimization program which includes automated exposure control, adjustment of the mA and/or kV according to patient size and/or use of iterative reconstruction technique. CONTRAST:  75mL OMNIPAQUE IOHEXOL 350 MG/ML SOLN COMPARISON:  01/10/2022 CTA head and  neck FINDINGS: CT HEAD FINDINGS Brain: No evidence of acute infarct, hemorrhage, mass, mass effect, or midline shift. No hydrocephalus or extra-axial fluid collection. Chronic left parietal infarct. Periventricular white matter changes, likely the sequela of chronic small vessel ischemic disease. Vascular: No hyperdense vessel. Skull: Negative for fracture or focal lesion. Sinuses/Orbits: No acute finding. Other: The mastoid air cells are well aerated. CTA NECK FINDINGS Aortic arch: Standard branching. Imaged portion shows no evidence of aneurysm or dissection. No significant stenosis of the major arch vessel origins. Aortic atherosclerosis. Right carotid system: No evidence of dissection, occlusion, or hemodynamically significant stenosis (greater than 50%). Atherosclerotic disease at the bifurcation and in the proximal ICA is not hemodynamically significant. Left carotid system: No evidence of dissection, occlusion, or hemodynamically significant stenosis (greater than 50%). Atherosclerotic disease at the bifurcation and in the proximal ICA is not hemodynamically significant. Vertebral arteries: No evidence of dissection, occlusion, or hemodynamically significant stenosis (greater than 50%). Skeleton: No acute osseous abnormality. Degenerative changes in the cervical spine. Other neck: No acute finding. Upper chest: Centrilobular and paraseptal emphysema. Review of the MIP images confirms the above findings CTA HEAD FINDINGS Anterior circulation: Both internal carotid arteries are patent to the termini, without significant stenosis. A1 segments patent. Normal anterior communicating artery. Azygous A2. Anterior cerebral arteries are patent to their distal aspects without significant stenosis. No M1 stenosis or occlusion. MCA branches are irregular but perfused  to their distal aspects without significant stenosis. Posterior circulation: Vertebral arteries patent to the vertebrobasilar junction without significant  stenosis. Posterior inferior cerebellar arteries patent proximally. Basilar is irregular but patent to its distal aspect without significant stenosis. Superior cerebellar arteries patent proximally. Patent P1 segments, diminutive on the left. Near fetal origin of the left PCA from the left posterior communicating artery. The right posterior communicating artery is also patent. PCAs are irregular but perfused to their distal aspects without significant stenosis. Venous sinuses: As permitted by contrast timing, patent. Anatomic variants: None significant. Review of the MIP images confirms the above findings IMPRESSION: 1. No acute intracranial process. Chronic left parietal infarct. 2. No intracranial large vessel occlusion or significant stenosis. 3. No hemodynamically significant stenosis in the neck. 4. Aortic atherosclerosis and emphysema. Aortic Atherosclerosis (ICD10-I70.0) and Emphysema (ICD10-J43.9). Electronically Signed   By: Wiliam Ke M.D.   On: 11/12/2022 01:20   DG Chest Portable 1 View  Result Date: 11/12/2022 CLINICAL DATA:  Left upper and lower extremity numbness and difficulty speaking. EXAM: PORTABLE CHEST 1 VIEW COMPARISON:  PA Lat 04/11/2022 FINDINGS: A left chest dual lead pacing system with AID wiring is again noted, stable. Mild cardiomegaly. The central vessels are slightly more prominent than previously. There are chronic interstitial changes of the lungs superimposed on mild emphysematous disease, but there are increased interstitial markings and haziness in the lower lung fields today. This could be due to interstitial pneumonitis or interstitial edema. The mediastinum is stable with mild aortic calcification and tortuosity. Clinical correlation and radiographic follow-up recommended. The mid and upper lung fields remain clear with COPD. There is no substantial pleural effusion. Thoracic spondylosis with no new osseous findings. IMPRESSION: 1. Increased interstitial markings and  haziness in the lower lung fields today which could be due to interstitial pneumonitis or interstitial edema. 2. Mild cardiomegaly. The central vessels are slightly more prominent than previously but there is no flow cephalization. 3. COPD changes with no other acute findings. 4. Left chest dual lead pacing system. 5. Aortic atherosclerosis. Electronically Signed   By: Almira Bar M.D.   On: 11/12/2022 00:25    Procedures Procedures    Medications Ordered in ED Medications  furosemide (LASIX) injection 40 mg (has no administration in time range)  iohexol (OMNIPAQUE) 350 MG/ML injection 100 mL (75 mLs Intravenous Contrast Given 11/12/22 0046)    ED Course/ Medical Decision Making/ A&P                             Medical Decision Making Patient with near syncopal, dizziness, chest pain and SOB   Amount and/or Complexity of Data Reviewed External Data Reviewed: labs, ECG and notes.    Details: Previous notes reviewed  Labs: ordered.    Details: Negative UDS, white count normal 8.5 , normal hemoglobin 16.6, normal platelets.  Negative covid and flu normal sodium 136, normal potassium 4.1, normal creatinine. Normal troponin 12, no hypercarbia on ABG Radiology: ordered and independent interpretation performed.    Details: No bleed on CTA by me old stroke  ECG/medicine tests: ordered and independent interpretation performed. Decision-making details documented in ED Course. Discussion of management or test interpretation with external provider(s): Case d/w Dr. Wilford Corner, MRI and admit to medicine Dr. Arlean Hopping will accept   Risk Prescription drug management. Decision regarding hospitalization. Risk Details: Will treat for CHF and cardiac enzymes.  .  Will need follow up studies for TIA on Eliquis.  Will admit to medicine for this.      Final Clinical Impression(s) / ED Diagnoses Final diagnoses:  Numbness  Acute congestive heart failure, unspecified heart failure type [facility])   The  patient appears reasonably stabilized for admission considering the current resources, flow, and capabilities available in the ED at this time, and I doubt any other [facility] requiring further screening and/or treatment in the ED prior to admission.  Rx / DC Orders ED Discharge Orders     None         Cashawn Yanko, MD 11/12/22 [phone]

## 2022-11-12 NOTE — Progress Notes (Signed)
Patient seen and examined; admitted after midnight with complaints for SOB and left sided weakness. Prior to admission there was also hx of dysarthria and HA's apparently, but symptoms resolved prior to arrival. CT head and CT angio of the head negative for acute intracranial abnormalities; patient CXR with mild vascular congestion. Patient admitted to complete TIA/stroke work up and for further management of CHF exacerbation. Please refer to H&P written by Dr. Margo Aye for further info/details on admission.  Plan: -Continue to follow daily weights, electrolytes and O's and IV diuresis repeat 2D echo demonstrating improvement in his ejection fraction now up to 40-45% (previously in the mid 30s percent). Continue telemetry monitoring -Follow electrolytes and replete as needed -Repeat CT scan in a.m. -Unable to perform MRI secondary to incompatible pacemaker.   Vassie Loll MD [phone]

## 2022-11-12 NOTE — ED Notes (Signed)
RT obtained ABG on pt w/the following results on RA sats of 97%.  Latest Reference Range & Units 11/12/22 01:58  Sample type  ARTERIAL  pH, Arterial 7.35 - 7.45  7.451 (H)  pCO2 arterial 32 - 48 mmHg 42.1  pO2, Arterial 83 - 108 mmHg 81 (L)  TCO2 22 - 32 mmol/L 31  Acid-Base Excess 0.0 - 2.0 mmol/L 5.0 (H)  Bicarbonate 20.0 - 28.0 mmol/L 29.4 (H)  O2 Saturation % 96  Patient temperature  98.1 F  (H): Data is abnormally high (L): Data is abnormally low

## 2022-11-12 NOTE — Progress Notes (Signed)
Echocardiogram 2D Echocardiogram has been performed.  Lucendia Herrlich 11/12/2022, 11:07 AM

## 2022-11-12 NOTE — Plan of Care (Signed)
  Problem: Education: Goal: Knowledge of General Education information will improve Description Including pain rating scale, medication(s)/side effects and non-pharmacologic comfort measures Outcome: Progressing   

## 2022-11-12 NOTE — Progress Notes (Signed)
Hospitalist Transfer Note:  Transferring facility: [facility] Requesting provider: Dr. Nicanor Alcon (EDP at [facility]) Reason for transfer: admission for further evaluation and management of acute on chronic systolic heart failure.   62 y.o.  male, with history of polysubstance abuse, paroxysmal atrial fibrillation complicated by sick sinus syndrome status post pacemaker placement, chronically anticoagulated on Eliquis, chronic systolic heart failure, with most recent LVEF 35 to 40% in September 2023, who presented to [facility] ED complaining of 1 day of shortness of breath associated with some chest discomfort, presyncope, nonproductive cough.  He reports good compliance with his chronic anticoagulation via Eliquis, and follows with Dr. Jerlyn Ly as his EP physician.   Troponin x 1 negative, with repeat trop pending. EKG without reported acute ischemic changes. CXR suggestive of interstital edema for which the patient has received Lasix 40 mg IV x 1.   The patient also reports some left-sided numbness starting at 1500 on 11/11/2022, which she conveys is persistent.  He also self-reports some active dysarthria, although no dysarthria is noted on exam by EDP.  No overt acute focal neurologic deficits on EDPs exam.  However, she notes the patient to be somnolent.  ABG currently pending to evaluate for any contributory hypercapnia. NIHSS 0. CT head and CTA head/neck are unremarkable.  Urinary drug screen negative.    EDP d/w on-call neuro (Dr. Wilford Corner), who recommended MRI, and conveyed that neuro is available on a prn basis for consult; MRI is ordered by EDP. Patient confirms presence of pacemaker and reports that the device and the leads are from two different manufacturers.    Subsequently, I accepted this patient for transfer for observation to a med-tele bed at [facility] for further work-up and management of the above.       Nursing staff, Please call [facility] Admits & Consults System-Wide number on Amion [phone]) as soon  as patient's arrival, so appropriate admitting provider can evaluate the pt.     Newton Pigg, DO Hospitalist

## 2022-11-12 NOTE — Plan of Care (Signed)

## 2022-11-12 NOTE — H&P (Addendum)
History and Physical  Mike Bean WUJ:[id number] DOB: 1960/06/01 DOA: 11/11/2022  Referring physician: Accepted by Dr. Arlean Hopping, [facility], hospitalist service. PCP: Elie Confer, NP  Outpatient Specialists: Cardiology, electrophysiology Patient coming from: Home through [facility] ED.  Chief Complaint: Shortness of breath  HPI: Mike Bean is a 62 y.o. 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  Associated with chest discomfort and a nonproductive cough.  Endorses intermittent dizziness and lightheadedness.    The patient also reported left-sided numbness starting around 1500 on 11/11/2022 with dysarthria.  Although not noted on exam.  No overt focal neurologic deficits on exam.  CT head and CT angio head and neck were unremarkable.  MRI brain is pending.  Per the patient, his pacemaker leads are not compatible with any MRI.  Neurology will be available if needed.  In the ED, mildly tachypneic with a respiratory rate of 23.  Not hypoxic.  Chest x-ray showing findings suggestive of interstitial pneumonitis or interstitial edema, mild cardiomegaly.  Due to concern for acute on chronic HFrEF the patient received 1 dose of IV Lasix 40 mg x 1 in the ED.  [facility], hospitalist service, was asked to admit.  The patient was admitted by Dr. Arlean Hopping, [facility], and transferred to [facility] telemetry medical medical unit as observation status.   ED Course: Temperature 98.1.  BP 125/86, pulse 69, respiratory 16, saturation 96% on room air.  Lab studies notable for high-sensitivity troponin 12.  Serum glucose 149.  BNP 61.  CBC unremarkable.  Review of Systems: Review of systems as noted in the HPI. All other systems  reviewed and are negative.   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 He has never used smokeless tobacco. He reports that he does not currently use alcohol after a past usage of about 21.0 standard drinks of alcohol per week. He reports that he does not currently use drugs after having used the following drugs: "Crack" cocaine.   No Known Allergies  Family History  Problem Relation Age of Onset   Heart attack Father 72       MI   Colon cancer Neg Hx       Prior to Admission medications   Medication Sig Start Date End Date Taking? Authorizing Provider  albuterol (PROVENTIL) (2.5 MG/3ML) 0.083% nebulizer solution Take 3 mLs (2.5 mg total) by nebulization every 6 (six) hours as needed for wheezing or shortness of breath. 06/09/19   Gerhard Munch, MD  albuterol (VENTOLIN HFA) 108 (90 Base) MCG/ACT inhaler Inhale 2 puffs into the lungs every 6 (six) hours as needed for wheezing or shortness of breath.    [provider]  apixaban (ELIQUIS) 5 MG TABS tablet TAKE 1 TABLET BY MOUTH TWICE A DAY 10/15/22   Marinus Maw, MD  Ascorbic Acid (VITAMIN C) 1000 MG tablet Take 1,000 mg by mouth daily.    [provider]  atorvastatin (LIPITOR) 80 MG tablet Take 1 tablet (80 mg total) by mouth daily. 01/12/22    [name], Werner Lean, MD  cetirizine (ZYRTEC) 10 MG chewable tablet Chew 10 mg by mouth daily.    [provider]  Coenzyme Q10 (CO Q 10) 100 MG CAPS Take 100 ng by mouth daily.    [provider]  cyanocobalamin (VITAMIN B12) 500 MCG tablet Take 1,000 mcg by mouth daily.    [provider]  dapagliflozin propanediol (FARXIGA) 10 MG TABS tablet Take 1 tablet (10 mg total) by mouth daily before breakfast. 11/09/21   Marinus Maw, MD  furosemide (LASIX) 40 MG tablet Take 1 tablet (40 mg total) by mouth daily as needed (Swelling). Take only for Leg swelling, once daily. 08/23/22 11/21/22  Marinus Maw, MD  ibuprofen (ADVIL) 600 MG tablet Take 600 mg by mouth 3 (three) times daily. 10/07/21   [provider]  Omega-3 Fatty Acids (FISH OIL CONCENTRATE) 300 MG CAPS Take 300 mg by mouth daily.    [provider]  ondansetron (ZOFRAN-ODT) 4 MG disintegrating tablet Take 1 tablet (4 mg total) by mouth every 8 (eight) hours as needed for nausea or vomiting. 04/11/22   Ernie Avena, MD  sacubitril-valsartan (ENTRESTO) 49-51 MG Take 1 tablet by mouth 2 (two) times daily. 08/08/22   Marinus Maw, MD    Physical Exam: BP 125/86 (BP Location: Right Arm)   Pulse 69   Temp 98.1 F (36.7 C) (Oral)   Resp 16   SpO2 96%   General: 62 y.o. year-old male well developed well nourished in no acute distress.  Somnolent but arousable to voices and oriented x3. Cardiovascular: Regular rate and rhythm with no rubs or gallops.  No thyromegaly or JVD noted.  Trace lower extremity edema with varicose veins in lower extremities bilaterally. Respiratory: Mild rales at bases with no wheezing noted.  Poor inspiratory effort. Abdomen: Soft nontender nondistended with normal bowel sounds x4 quadrants. Muskuloskeletal: No cyanosis or clubbing noted bilaterally Neuro: CN II-XII intact, strength, sensation, reflexes Skin: No ulcerative lesions noted or rashes Psychiatry:  Judgement and insight appear normal. Mood is appropriate for condition and setting          Labs on Admission:  Basic Metabolic Panel: Recent Labs  Lab 11/11/22 2340 11/12/22 0158  NA 136 135  K 4.1 3.9  CL 100  --   CO2 27  --   GLUCOSE 149*  --   BUN 21  --   CREATININE 1.19  --   CALCIUM 8.8*  --    Liver Function Tests: No results for input(s): "AST", "ALT", "ALKPHOS", "BILITOT", "PROT", "ALBUMIN" in the last 168 hours. No results for input(s): "LIPASE", "AMYLASE" in the last 168 hours. No results for input(s): "AMMONIA" in the last 168 hours. CBC: Recent Labs  Lab 11/11/22 2340 11/12/22 0158  WBC 8.5  --   NEUTROABS 5.4  --   HGB 16.6 16.3  HCT 49.4 48.0  MCV 89.0  --   PLT 178  --  Cardiac Enzymes: No results for input(s): "CKTOTAL", "CKMB", "CKMBINDEX", "TROPONINI" in the last 168 hours.  BNP (last 3 results) Recent Labs    11/11/22 2340  BNP 61.8    ProBNP (last 3 results) No results for input(s): "PROBNP" in the last 8760 hours.  CBG: No results for input(s): "GLUCAP" in the last 168 hours.  Radiological Exams on Admission: CT ANGIO HEAD NECK W WO CM  Result Date: 11/12/2022 CLINICAL DATA:  Left arm and leg numbness, difficulty speaking, dizziness EXAM: CT ANGIOGRAPHY HEAD AND NECK WITH AND WITHOUT CONTRAST TECHNIQUE: Multidetector CT imaging of the head and neck was performed using the standard protocol during bolus administration of intravenous contrast. Multiplanar CT image reconstructions and MIPs were obtained to evaluate the vascular anatomy. Carotid stenosis measurements (when applicable) are obtained utilizing NASCET criteria, using the distal internal carotid diameter as the denominator. RADIATION DOSE REDUCTION: This exam was performed according to the departmental dose-optimization program which includes automated exposure control, adjustment of the mA and/or kV according to patient size and/or use of iterative reconstruction technique.  CONTRAST:  75mL OMNIPAQUE IOHEXOL 350 MG/ML SOLN COMPARISON:  01/10/2022 CTA head and neck FINDINGS: CT HEAD FINDINGS Brain: No evidence of acute infarct, hemorrhage, mass, mass effect, or midline shift. No hydrocephalus or extra-axial fluid collection. Chronic left parietal infarct. Periventricular white matter changes, likely the sequela of chronic small vessel ischemic disease. Vascular: No hyperdense vessel. Skull: Negative for fracture or focal lesion. Sinuses/Orbits: No acute finding. Other: The mastoid air cells are well aerated. CTA NECK FINDINGS Aortic arch: Standard branching. Imaged portion shows no evidence of aneurysm or dissection. No significant stenosis of the major arch vessel origins. Aortic atherosclerosis. Right carotid system: No evidence of dissection, occlusion, or hemodynamically significant stenosis (greater than 50%). Atherosclerotic disease at the bifurcation and in the proximal ICA is not hemodynamically significant. Left carotid system: No evidence of dissection, occlusion, or hemodynamically significant stenosis (greater than 50%). Atherosclerotic disease at the bifurcation and in the proximal ICA is not hemodynamically significant. Vertebral arteries: No evidence of dissection, occlusion, or hemodynamically significant stenosis (greater than 50%). Skeleton: No acute osseous abnormality. Degenerative changes in the cervical spine. Other neck: No acute finding. Upper chest: Centrilobular and paraseptal emphysema. Review of the MIP images confirms the above findings CTA HEAD FINDINGS Anterior circulation: Both internal carotid arteries are patent to the termini, without significant stenosis. A1 segments patent. Normal anterior communicating artery. Azygous A2. Anterior cerebral arteries are patent to their distal aspects without significant stenosis. No M1 stenosis or occlusion. MCA branches are irregular but perfused to their distal aspects without significant stenosis. Posterior  circulation: Vertebral arteries patent to the vertebrobasilar junction without significant stenosis. Posterior inferior cerebellar arteries patent proximally. Basilar is irregular but patent to its distal aspect without significant stenosis. Superior cerebellar arteries patent proximally. Patent P1 segments, diminutive on the left. Near fetal origin of the left PCA from the left posterior communicating artery. The right posterior communicating artery is also patent. PCAs are irregular but perfused to their distal aspects without significant stenosis. Venous sinuses: As permitted by contrast timing, patent. Anatomic variants: None significant. Review of the MIP images confirms the above findings IMPRESSION: 1. No acute intracranial process. Chronic left parietal infarct. 2. No intracranial large vessel occlusion or significant stenosis. 3. No hemodynamically significant stenosis in the neck. 4. Aortic atherosclerosis and emphysema. Aortic Atherosclerosis (ICD10-I70.0) and Emphysema (ICD10-J43.9). Electronically Signed   By: Wiliam Ke M.D.   On: 11/12/2022 01:20   DG  Chest Portable 1 View  Result Date: 11/12/2022 CLINICAL DATA:  Left upper and lower extremity numbness and difficulty speaking. EXAM: PORTABLE CHEST 1 VIEW COMPARISON:  PA Lat 04/11/2022 FINDINGS: A left chest dual lead pacing system with AID wiring is again noted, stable. Mild cardiomegaly. The central vessels are slightly more prominent than previously. There are chronic interstitial changes of the lungs superimposed on mild emphysematous disease, but there are increased interstitial markings and haziness in the lower lung fields today. This could be due to interstitial pneumonitis or interstitial edema. The mediastinum is stable with mild aortic calcification and tortuosity. Clinical correlation and radiographic follow-up recommended. The mid and upper lung fields remain clear with COPD. There is no substantial pleural effusion. Thoracic  spondylosis with no new osseous findings. IMPRESSION: 1. Increased interstitial markings and haziness in the lower lung fields today which could be due to interstitial pneumonitis or interstitial edema. 2. Mild cardiomegaly. The central vessels are slightly more prominent than previously but there is no flow cephalization. 3. COPD changes with no other acute findings. 4. Left chest dual lead pacing system. 5. Aortic atherosclerosis. Electronically Signed   By: Almira Bar M.D.   On: 11/12/2022 00:25    EKG: I independently viewed the EKG done and my findings are as followed: V paced rate of 73.  QTc 500.  Assessment/Plan Present on Admission:  Acute on chronic systolic heart failure [facility])  Principal Problem:   Acute on chronic systolic heart failure (HCC)  Acute on chronic HFrEF, unclear trigger Last 2D echo done on 09/12/2020 revealed LVEF 25 to 30% Received 1 dose of IV Lasix 40 mg x 1 in the ED Repeat 2D echo Monitor strict I's and O's and daily weight Resume home cardiac medications if blood pressure allows. Continue IV diuresing, replete electrolytes as indicated, and maintain MAP greater than 65.  Dizziness/left-sided numbness Per the patient his pacemaker leads are not compatible with MRI Radiology will investigate further, cancel MRI not compatible with pacemaker leads Noncontrast head CT and CT angio head and neck were unremarkable PT OT evaluation Fall precautions  Paroxysmal A-fib on Eliquis Resume home Eliquis Not on any home rate control agent Monitor on telemetry  Sick sinus syndrome status post pacemaker placement No acute issues  Hyperlipidemia Resume home Lipitor  History of prediabetes with hyperglycemia Last hemoglobin A1c 5.8 in 2023 Not on insulin therapy at home Diet controlled CBG nightly   Time: 75 minutes.   DVT prophylaxis: Home Eliquis  Code Status: Full code  Family Communication: None at bedside  Disposition Plan: Admitted to  telemetry medical unit  Consults called: None  Admission status: Observation status.   Status is: Observation    Darlin Drop MD [facility] Pager [phone]  If 7PM-7AM, please contact night-coverage [email or web address] Password [facility]  11/12/2022, 5:36 AM

## 2022-11-12 NOTE — Progress Notes (Signed)
PT Cancellation Note  Patient Details Name: Mike Bean MRN: [id number] DOB: 1960-11-29   Cancelled Treatment:    Reason Eval/Treat Not Completed: PT screened, no needs identified, will sign off - OT states pt is mod I, PT to sign off.  Marye Round, PT DPT Acute Rehabilitation Services Secure Chat Preferred  Office [phone]]    Idriss Quackenbush Sheliah Plane 11/12/2022, 3:21 PM

## 2022-11-12 NOTE — Patient Instructions (Signed)
Theraputty Home Exercise Program  Complete 1-2 times a day.  putty squeeze  Pt. should squeeze putty in hand trying to keep it round by rotating putty after each squeeze. push fingers through putty to palm each time. Complete for __3-5____ minutes.   PUTTY KEY GRIP  Hold the putty at the top of your hand. Squeeze the putty between your thumb and the side of your 2nd finger as shown. Complete for __3-5______ minutes.    PUTTY 3 JAW CHUCK  Roll up some putty into a ball then flatten it. Then, firmly squeeze it with your first 3 fingers as shown. Complete for __3-5____ minutes.   *You may complete the same exercises using the GREEN resistive foam block as well.

## 2022-11-12 NOTE — Progress Notes (Signed)
@  2952- Pt has home meds in a bag on his bedside, RN instructed him not to take any of his home meds to avoid double dosing.   @ 0830- Patient's friend came over so this RN advise pt to send his home meds with him so he can bring it to pt.'s home but pt is adamant to keep his home meds at the bedside and told this RN not to worry as he will not take it.

## 2022-11-12 NOTE — ED Notes (Signed)
-  Called [facility] at 148am, spoke to [place].

## 2022-11-13 ENCOUNTER — Encounter (HOSPITAL_COMMUNITY): Payer: Self-pay | Admitting: Internal Medicine

## 2022-11-13 ENCOUNTER — Other Ambulatory Visit (HOSPITAL_COMMUNITY): Payer: Self-pay

## 2022-11-13 ENCOUNTER — Observation Stay (HOSPITAL_COMMUNITY): Payer: PRIVATE HEALTH INSURANCE

## 2022-11-13 DIAGNOSIS — R200 Anesthesia of skin: Principal | ICD-10-CM

## 2022-11-13 DIAGNOSIS — I5023 Acute on chronic systolic (congestive) heart failure: Secondary | ICD-10-CM | POA: Diagnosis not present

## 2022-11-13 DIAGNOSIS — R42 Dizziness and giddiness: Secondary | ICD-10-CM

## 2022-11-13 DIAGNOSIS — J449 Chronic obstructive pulmonary disease, unspecified: Secondary | ICD-10-CM

## 2022-11-13 DIAGNOSIS — F1010 Alcohol abuse, uncomplicated: Secondary | ICD-10-CM

## 2022-11-13 DIAGNOSIS — G459 Transient cerebral ischemic attack, unspecified: Secondary | ICD-10-CM | POA: Diagnosis not present

## 2022-11-13 DIAGNOSIS — Z720 Tobacco use: Secondary | ICD-10-CM

## 2022-11-13 LAB — BASIC METABOLIC PANEL
Anion gap: 7 (ref 5–15)
BUN: 28 mg/dL — ABNORMAL HIGH (ref 8–23)
CO2: 30 mmol/L (ref 22–32)
Calcium: 8.8 mg/dL — ABNORMAL LOW (ref 8.9–10.3)
Chloride: 95 mmol/L — ABNORMAL LOW (ref 98–111)
Creatinine, Ser: 1.24 mg/dL (ref 0.61–1.24)
GFR, Estimated: >60 mL/min (ref >60)
Glucose, Bld: 99 mg/dL (ref 70–99)
Potassium: 4.2 mmol/L (ref 3.5–5.1)
Sodium: 132 mmol/L — ABNORMAL LOW (ref 135–145)

## 2022-11-13 LAB — MAGNESIUM: Magnesium: 2.2 mg/dL (ref 1.7–2.4)

## 2022-11-13 LAB — CBC
HCT: 51.3 % (ref 39.0–52.0)
Hemoglobin: 16.9 g/dL (ref 13.0–17.0)
MCH: 29.4 pg (ref 26.0–34.0)
MCHC: 32.9 g/dL (ref 30.0–36.0)
MCV: 89.2 fL (ref 80.0–100.0)
Platelets: 203 10*3/uL (ref 150–400)
RBC: 5.75 MIL/uL (ref 4.22–5.81)
RDW: 13 % (ref 11.5–15.5)
WBC: 10.9 10*3/uL — ABNORMAL HIGH (ref 4.0–10.5)
nRBC: 0 % (ref 0.0–0.2)

## 2022-11-13 MED ORDER — FOLIC ACID 1 MG PO TABS
1.0000 mg | ORAL_TABLET | Freq: Every day | ORAL | 2 refills | Status: AC
  Filled 2022-11-13: qty 30, 30d supply, fill #0

## 2022-11-13 MED ORDER — MECLIZINE HCL 25 MG PO TABS
25.0000 mg | ORAL_TABLET | Freq: Three times a day (TID) | ORAL | 0 refills | Status: AC | PRN
  Filled 2022-11-13: qty 30, 10d supply, fill #0

## 2022-11-13 MED ORDER — DM-GUAIFENESIN ER 30-600 MG PO TB12
1.0000 | ORAL_TABLET | Freq: Two times a day (BID) | ORAL | 0 refills | Status: AC
  Filled 2022-11-13: qty 24, 12d supply, fill #0

## 2022-11-13 MED ORDER — THIAMINE HCL 100 MG PO TABS
100.0000 mg | ORAL_TABLET | Freq: Every day | ORAL | 2 refills | Status: AC
  Filled 2022-11-13: qty 30, 30d supply, fill #0

## 2022-11-13 MED ORDER — NICOTINE 21 MG/24HR TD PT24
21.0000 mg | MEDICATED_PATCH | Freq: Every day | TRANSDERMAL | 0 refills | Status: AC
Start: 2022-11-14 — End: ?
  Filled 2022-11-13: qty 28, 28d supply, fill #0

## 2022-11-13 MED ORDER — BENZONATATE 200 MG PO CAPS
200.0000 mg | ORAL_CAPSULE | Freq: Three times a day (TID) | ORAL | 0 refills | Status: AC | PRN
  Filled 2022-11-13: qty 20, 7d supply, fill #0

## 2022-11-13 MED ORDER — MECLIZINE HCL 25 MG PO TABS: 25.0000 mg | ORAL_TABLET | Freq: Three times a day (TID) | ORAL | Status: DC | PRN

## 2022-11-13 MED ORDER — BENZONATATE 100 MG PO CAPS
200.0000 mg | ORAL_CAPSULE | Freq: Three times a day (TID) | ORAL | Status: DC | PRN
  Administered 2022-11-13: 200 mg via ORAL
  Filled 2022-11-13: qty 2

## 2022-11-13 MED ORDER — FUROSEMIDE 40 MG PO TABS
40.0000 mg | ORAL_TABLET | Freq: Every day | ORAL | 1 refills | Status: DC | PRN
Start: 2022-11-13 — End: 2023-07-23
  Filled 2022-11-13: qty 40, 40d supply, fill #0

## 2022-11-13 MED ORDER — SALINE SPRAY 0.65 % NA SOLN
1.0000 | NASAL | Status: DC | PRN
  Filled 2022-11-13: qty 44

## 2022-11-13 MED ORDER — GUAIFENESIN ER 600 MG PO TB12
600.0000 mg | ORAL_TABLET | Freq: Two times a day (BID) | ORAL | Status: DC
  Administered 2022-11-13: 600 mg via ORAL
  Filled 2022-11-13: qty 1

## 2022-11-13 NOTE — Progress Notes (Signed)
   Heart Failure Stewardship Pharmacist Progress Note   PCP: Elie Confer, NP PCP-Cardiologist: None    HPI:  62 yo M with PMH of afib, SSS s/p PPM, CHF, and polysubstance use.   Presented to the ED on 7/15 with shortness of breath, chest discomfort, cough, and left sided numbness. CXR with interstitial edema. UDS negative. CT head and CT angio head and neck were unremarkable. PPM not compatible with MRI. ECHO on 7/15 showed LVEF 40-45% (was 35-40% 12/2021), no RWMA, RV moderately reduced, trivial MR.   Current HF Medications: ACE/ARB/ARNI: Entresto 49/51 mg BID  Prior to admission HF Medications: Diuretic: furosemide 40 mg daily PRN ACE/ARB/ARNI: Entresto 49/51 mg BID SGLT2i: Farxiga 10 mg daily  Pertinent Lab Values: Serum creatinine 1.24, BUN 28, Potassium 4.2, Sodium 132, BNP 61.8, Magnesium 2.2  Vital Signs: Weight: 183 lbs (admission weight: 191 lbs) Blood pressure: 110/80s  Heart rate: 70s  I/O: incomplete  Medication Assistance / Insurance Benefits Check: Does the patient have prescription insurance?  Yes Type of insurance plan: commercial insurance  Outpatient Pharmacy:  Prior to admission outpatient [facility]: [facility] Is the patient willing to use [facility] TOC pharmacy at discharge? Yes Is the patient willing to transition their outpatient pharmacy to utilize a [facility] outpatient pharmacy?   Pending    Assessment: 1. Acute on chronic systolic CHF (LVEF 40-45%), due to NICM, presumed to be tachy-mediated. NYHA class II symptoms. - Furosemide 20 mg IV BID x 2 doses given on 7/15 - Consider restarting metoprolol XL prior to discharge - Continue Entresto 49/51 mg BID - Consider adding spironolactone 12.5 mg daily - Consider restarting Farxiga 10 mg daily  Plan: 1) Medication changes recommended at this time: - Restart Farxiga 10 mg daily - Start spironolactone 12.5 mg daily  2) Patient assistance: - Can use monthly copay cards  3)  Education  -  Patient has been educated on current HF medications and potential additions to HF medication regimen - Patient verbalizes understanding that over the next few months, these medication doses may change and more medications may be added to optimize HF regimen - Patient has been educated on basic disease state pathophysiology and goals of therapy   Sharen Hones, PharmD, BCPS Heart Failure Engineer, building services Phone [phone]

## 2022-11-13 NOTE — Progress Notes (Signed)
[facility] night cross cover note:   I was notified by RN that the patient is having a frequent productive cough/congestion.  I subsequently placed orders for scheduled guaifenesin, prn Tessalon Perles, and prn Ocean nasal spray.    Mike Pigg, DO Hospitalist

## 2022-11-13 NOTE — Discharge Summary (Signed)
Physician Discharge Summary   Patient: Mike Bean MRN: [id number] DOB: 1960-12-20  Admit date:     11/11/2022  Discharge date: 11/13/22  Discharge Physician: Vassie Loll   PCP: Mike Confer, NP   Recommendations at discharge:  Repeat CBC to follow hemoglobin trend/stability Repeat basic metabolic panel to follow ultralights renal function Continue assisting patient with tobacco and recreational drug cessation's/full abstinence. Make sure patient has follow-up with cardiology service as instructed Continue to closely follow CBGs/A1c to further decide on hypoglycemic management.  Discharge Diagnoses: Principal Problem:   Acute on chronic systolic heart failure (HCC) Active Problems:   COPD mixed type (HCC)   Numbness   TIA (transient ischemic attack)   Vertigo   Alcohol abuse   Tobacco abuse  Brief Hospital admission narrative: As per H&P written by Dr. Margo Bean on 11/12/2022 [name] is a 62 y.o. male with medical history significant for former alcohol abuse, sober for the past 90 days, paroxysmal A-fib on Eliquis, prediabetes, sick sinus syndrome status post pacemaker placement (apparently not compatible with MRI), chronic HFrEF 35 to 40%, who initially presented to [facility] ED with complaints of 1 day of shortness of breath.  Associated with chest discomfort and a nonproductive cough.  Endorses intermittent dizziness and lightheadedness.     The patient also reported left-sided numbness starting around 1500 on 11/11/2022 with dysarthria.  Although not noted on exam.  No overt focal neurologic deficits on exam.  CT head and CT angio head and neck were unremarkable.  MRI brain is pending.  Per the patient, his pacemaker leads are not compatible with any MRI.  Neurology will be available if needed.   In the ED, mildly tachypneic with a respiratory rate of 23.  Not hypoxic.  Chest x-ray showing findings suggestive of interstitial pneumonitis or interstitial edema, mild  cardiomegaly.  Due to concern for acute on chronic HFrEF the patient received 1 dose of IV Lasix 40 mg x 1 in the ED.  TRH, hospitalist service, was asked to admit.  The patient was admitted by Dr. Arlean Bean, [facility], and transferred to [facility] telemetry medical medical unit as observation status.    ED Course: Temperature 98.1.  BP 125/86, pulse 69, respiratory 16, saturation 96% on room air.  Lab studies notable for high-sensitivity troponin 12.  Serum glucose 149.  BNP 61.  CBC unremarkable.  Assessment and Plan: 1-acute on chronic heart failure with reduced ejection fraction -Repeat 2D echo demonstrating improvement in his ejection fraction to 40-45% currently -Continue patient follow-up with cardiology service -Continue treatment with as needed diuretics, Entresto, Farxiga -Low-sodium diet and adequate hydration discussed with patient -Patient was also instructed to check weight on daily basis.  2-paroxysmal atrial fibrillation -Patient remains in sinus rhythm throughout hospitalization -Continue Eliquis for secondary prevention. -Status post pacemaker implantation -Continue patient follow-up with electrophysiology service.  3-dizziness/left-sided numbness/TIA -Pacemaker noncompatible with MRI -CT scan of the head/CT angiogram and repeat CT demonstrated no acute abnormality -Continue reflector modification -Patient instructed to take B12, thiamine and folic acid -No acute findings suggesting a stroke throughout hospitalization.  4-vertigo: -Continue as needed meclizine.  5-hyperlipidemia -Continue statin.  6-history of alcohol abuse -Has been incomplete abstinence for the last 90 days and actively following program at [facility] -Patient congratulated and encouraged to continue abstinence program -Continue thiamine and folic acid -No withdrawal symptoms appreciated.  7-tobacco abuse/COPD -Cessation counseling provided -Nicotine patch prescribed at discharge -Discussion  regarding cutting down and quitting provided throughout hospitalization. -No  acute exacerbation appreciated -Continue home bronchodilator management.  8-history of prediabetes -Continue diet control -Close outpatient follow-up to further adjust/initiate hypoglycemic management as needed.  Consultants: Neurology curbside. Procedures performed: See below for x-ray reports. Disposition: Home Diet recommendation: Heart healthy modified carbohydrate diet.  DISCHARGE MEDICATION: Allergies as of 11/13/2022   No Known Allergies      Medication List     STOP taking these medications    Co Q 10 100 MG Caps       TAKE these medications    albuterol 108 (90 Base) MCG/ACT inhaler Commonly known as: VENTOLIN HFA Inhale 2 puffs into the lungs every 6 (six) hours as needed for wheezing or shortness of breath.   albuterol (2.5 MG/3ML) 0.083% nebulizer solution Commonly known as: PROVENTIL Take 3 mLs (2.5 mg total) by nebulization every 6 (six) hours as needed for wheezing or shortness of breath.   atorvastatin 80 MG tablet Commonly known as: Lipitor Take 1 tablet (80 mg total) by mouth daily.   benzonatate 200 MG capsule Commonly known as: TESSALON Take 1 capsule (200 mg total) by mouth 3 (three) times daily as needed for cough.   cetirizine 10 MG chewable tablet Commonly known as: ZYRTEC Chew 10 mg by mouth daily.   cyanocobalamin 500 MCG tablet Commonly known as: VITAMIN B12 Take 1,000 mcg by mouth daily.   dapagliflozin propanediol 10 MG Tabs tablet Commonly known as: Farxiga Take 1 tablet (10 mg total) by mouth daily before breakfast.   dextromethorphan-guaiFENesin 30-600 MG 12hr tablet Commonly known as: MUCINEX DM Take 1 tablet by mouth 2 (two) times daily for 12 days.   Eliquis 5 MG Tabs tablet Generic drug: apixaban TAKE 1 TABLET BY MOUTH TWICE A DAY   Entresto 49-51 MG Generic drug: sacubitril-valsartan Take 1 tablet by mouth 2 (two) times daily.   Fish  Oil Concentrate 300 MG Caps Take 300 mg by mouth daily.   folic acid 1 MG tablet Commonly known as: FOLVITE Take 1 tablet (1 mg total) by mouth daily. Start taking on: November 14, 2022   furosemide 40 MG tablet Commonly known as: LASIX Take 1 tablet (40 mg total) by mouth daily as needed for fluid or edema (SOB.). What changed:  reasons to take this additional instructions   meclizine 25 MG tablet Commonly known as: ANTIVERT Take 1 tablet (25 mg total) by mouth every 8 (eight) hours as needed for dizziness.   nicotine 21 mg/24hr patch Commonly known as: NICODERM CQ - dosed in mg/24 hours Place 1 patch (21 mg total) onto the skin daily. Start taking on: November 14, 2022   thiamine 100 MG tablet Commonly known as: Vitamin B-1 Take 1 tablet (100 mg total) by mouth daily. Start taking on: November 14, 2022   vitamin C 1000 MG tablet Take 1,000 mg by mouth daily.               Durable Medical Equipment  (From admission, onward)           Start     Ordered   11/13/22 1404  For home use only DME Nebulizer machine  Once       Question Answer Comment  Patient needs a nebulizer to treat with the following condition COPD (chronic obstructive pulmonary disease) (HCC)   Length of Need Lifetime      11/13/22 1403            Follow-up Information     [facility]  Specialty Clinics. Go in 4 day(s).   Specialty: Cardiology Why: Hospital follow up 11/19/2022 @ 3 pm PLEASE bring a current medication list to appointment FREE valet parking, Entrance C, off [employer] information: [address] [id number] [place] [address] [phone]        Mike Confer, NP Follow up in 10 day(s).   Contact information: [address] [place] [address] [phone]                Discharge Exam: Filed Weights   11/12/22 0450 11/13/22 0458  Weight: 86.7 kg 83.4 kg   General exam: Alert,  awake, oriented x 3 Respiratory system: Clear to auscultation. Respiratory effort normal. Cardiovascular system:RRR. No murmurs, rubs, gallops. Gastrointestinal system: Abdomen is nondistended, soft and nontender. No organomegaly or masses felt. Normal bowel sounds heard. Central nervous system: Alert and oriented. No focal neurological deficits. Extremities: No C/C/E, +pedal pulses Skin: No rashes, lesions or ulcers Psychiatry: Judgement and insight appear normal. Mood & affect appropriate.    Condition at discharge: Stable and improved.  The results of significant diagnostics from this hospitalization (including imaging, microbiology, ancillary and laboratory) are listed below for reference.   Imaging Studies: ECHOCARDIOGRAM COMPLETE  Result Date: 11/12/2022    ECHOCARDIOGRAM REPORT   Patient Name:   RYZEN DEADY Fikes Date of Exam: 11/12/2022 Medical Rec #:  [id number]    Height:       70.0 in Accession #:    [id number]   Weight:       191.1 lb Date of Birth:  29-Oct-1960    BSA:          2.048 m Patient Age:    62 years     BP:           115/79 mmHg Patient Gender: M            HR:           70 bpm. Exam Location:  Inpatient Procedure: 2D Echo, Cardiac Doppler and Color Doppler Indications:    CHF-Acute Systolic I50.21  History:        Patient has prior history of Echocardiogram examinations, most                 recent 01/11/2022. CHF and Cardiomyopathy, Pacemaker, Stroke and                 COPD; Arrythmias:Atrial Fibrillation.  Sonographer:    Lucendia Herrlich Referring Phys: [id number] CAROLE N HALL IMPRESSIONS  1. EF very hard to estimate due to AF and frequent PVCs. Left ventricular ejection fraction, by estimation, is 40 to 45%. The left ventricle has mildly decreased function. The left ventricle has no regional wall motion abnormalities. Left ventricular diastolic function could not be evaluated.  2. Right ventricular systolic function is moderately reduced. The right ventricular size is normal.  3.  Left atrial size was mildly dilated.  4. Right atrial size was mild to moderately dilated.  5. The mitral valve is normal in structure. Trivial mitral valve regurgitation. No evidence of mitral stenosis.  6. The aortic valve is tricuspid. There is mild calcification of the aortic valve. Aortic valve regurgitation is not visualized. Aortic valve sclerosis/calcification is present, without any evidence of aortic stenosis.  7. Aortic dilatation noted. There is mild dilatation of the aortic root, measuring 41 mm.  8. The inferior vena cava is normal in size with greater than 50% respiratory variability, suggesting right atrial pressure of  3 mmHg. FINDINGS  Left Ventricle: EF very hard to estimate due to AF and frequent PVCs. Left ventricular ejection fraction, by estimation, is 40 to 45%. The left ventricle has mildly decreased function. The left ventricle has no regional wall motion abnormalities. The left ventricular internal cavity size was normal in size. There is no left ventricular hypertrophy. Left ventricular diastolic function could not be evaluated due to atrial fibrillation. Left ventricular diastolic function could not be evaluated. Right Ventricle: The right ventricular size is normal. No increase in right ventricular wall thickness. Right ventricular systolic function is moderately reduced. Left Atrium: Left atrial size was mildly dilated. Right Atrium: Right atrial size was mild to moderately dilated. Pericardium: There is no evidence of pericardial effusion. Mitral Valve: The mitral valve is normal in structure. Trivial mitral valve regurgitation. No evidence of mitral valve stenosis. Tricuspid Valve: The tricuspid valve is normal in structure. Tricuspid valve regurgitation is trivial. No evidence of tricuspid stenosis. Aortic Valve: The aortic valve is tricuspid. There is mild calcification of the aortic valve. Aortic valve regurgitation is not visualized. Aortic valve sclerosis/calcification is  present, without any evidence of aortic stenosis. Aortic valve peak gradient measures 6.2 mmHg. Pulmonic Valve: The pulmonic valve was normal in structure. Pulmonic valve regurgitation is not visualized. No evidence of pulmonic stenosis. Aorta: Aortic dilatation noted. There is mild dilatation of the aortic root, measuring 41 mm. Venous: The inferior vena cava is normal in size with greater than 50% respiratory variability, suggesting right atrial pressure of 3 mmHg. IAS/Shunts: No atrial level shunt detected by color flow Doppler. Additional Comments: A device lead is visualized.  LEFT VENTRICLE PLAX 2D LVIDd:         5.00 cm   Diastology LVIDs:         3.45 cm   LV e' medial:  9.79 cm/s LV PW:         1.10 cm   LV e' lateral: 7.62 cm/s LV IVS:        1.00 cm LVOT diam:     2.30 cm LV SV:         57 LV SV Index:   28 LVOT Area:     4.15 cm  RIGHT VENTRICLE             IVC RV S prime:     15.00 cm/s  IVC diam: 1.70 cm TAPSE (M-mode): 1.3 cm LEFT ATRIUM             Index        RIGHT ATRIUM           Index LA diam:        4.00 cm 1.95 cm/m   RA Area:     22.30 cm LA Vol (A2C):   65.8 ml 32.14 ml/m  RA Volume:   69.10 ml  33.75 ml/m LA Vol (A4C):   68.1 ml 33.26 ml/m LA Biplane Vol: 66.3 ml 32.38 ml/m  AORTIC VALVE AV Area (Vmax): 3.01 cm AV Vmax:        124.00 cm/s AV Peak Grad:   6.2 mmHg LVOT Vmax:      89.73 cm/s LVOT Vmean:     55.500 cm/s LVOT VTI:       0.137 m  AORTA Ao Root diam: 4.10 cm Ao Asc diam:  3.70 cm  SHUNTS Systemic VTI:  0.14 m Systemic Diam: 2.30 cm Arvilla Meres MD Electronically signed by Arvilla Meres MD Signature Date/Time: 11/12/2022/1:14:55 PM  Final    CT ANGIO HEAD NECK W WO CM  Result Date: 11/12/2022 CLINICAL DATA:  Left arm and leg numbness, difficulty speaking, dizziness EXAM: CT ANGIOGRAPHY HEAD AND NECK WITH AND WITHOUT CONTRAST TECHNIQUE: Multidetector CT imaging of the head and neck was performed using the standard protocol during bolus administration of  intravenous contrast. Multiplanar CT image reconstructions and MIPs were obtained to evaluate the vascular anatomy. Carotid stenosis measurements (when applicable) are obtained utilizing NASCET criteria, using the distal internal carotid diameter as the denominator. RADIATION DOSE REDUCTION: This exam was performed according to the departmental dose-optimization program which includes automated exposure control, adjustment of the mA and/or kV according to patient size and/or use of iterative reconstruction technique. CONTRAST:  75mL OMNIPAQUE IOHEXOL 350 MG/ML SOLN COMPARISON:  01/10/2022 CTA head and neck FINDINGS: CT HEAD FINDINGS Brain: No evidence of acute infarct, hemorrhage, mass, mass effect, or midline shift. No hydrocephalus or extra-axial fluid collection. Chronic left parietal infarct. Periventricular white matter changes, likely the sequela of chronic small vessel ischemic disease. Vascular: No hyperdense vessel. Skull: Negative for fracture or focal lesion. Sinuses/Orbits: No acute finding. Other: The mastoid air cells are well aerated. CTA NECK FINDINGS Aortic arch: Standard branching. Imaged portion shows no evidence of aneurysm or dissection. No significant stenosis of the major arch vessel origins. Aortic atherosclerosis. Right carotid system: No evidence of dissection, occlusion, or hemodynamically significant stenosis (greater than 50%). Atherosclerotic disease at the bifurcation and in the proximal ICA is not hemodynamically significant. Left carotid system: No evidence of dissection, occlusion, or hemodynamically significant stenosis (greater than 50%). Atherosclerotic disease at the bifurcation and in the proximal ICA is not hemodynamically significant. Vertebral arteries: No evidence of dissection, occlusion, or hemodynamically significant stenosis (greater than 50%). Skeleton: No acute osseous abnormality. Degenerative changes in the cervical spine. Other neck: No acute finding. Upper chest:  Centrilobular and paraseptal emphysema. Review of the MIP images confirms the above findings CTA HEAD FINDINGS Anterior circulation: Both internal carotid arteries are patent to the termini, without significant stenosis. A1 segments patent. Normal anterior communicating artery. Azygous A2. Anterior cerebral arteries are patent to their distal aspects without significant stenosis. No M1 stenosis or occlusion. MCA branches are irregular but perfused to their distal aspects without significant stenosis. Posterior circulation: Vertebral arteries patent to the vertebrobasilar junction without significant stenosis. Posterior inferior cerebellar arteries patent proximally. Basilar is irregular but patent to its distal aspect without significant stenosis. Superior cerebellar arteries patent proximally. Patent P1 segments, diminutive on the left. Near fetal origin of the left PCA from the left posterior communicating artery. The right posterior communicating artery is also patent. PCAs are irregular but perfused to their distal aspects without significant stenosis. Venous sinuses: As permitted by contrast timing, patent. Anatomic variants: None significant. Review of the MIP images confirms the above findings IMPRESSION: 1. No acute intracranial process. Chronic left parietal infarct. 2. No intracranial large vessel occlusion or significant stenosis. 3. No hemodynamically significant stenosis in the neck. 4. Aortic atherosclerosis and emphysema. Aortic Atherosclerosis (ICD10-I70.0) and Emphysema (ICD10-J43.9). Electronically Signed   By: Wiliam Ke M.D.   On: 11/12/2022 01:20   DG Chest Portable 1 View  Result Date: 11/12/2022 CLINICAL DATA:  Left upper and lower extremity numbness and difficulty speaking. EXAM: PORTABLE CHEST 1 VIEW COMPARISON:  PA Lat 04/11/2022 FINDINGS: A left chest dual lead pacing system with AID wiring is again noted, stable. Mild cardiomegaly. The central vessels are slightly more prominent  than previously. There are chronic  interstitial changes of the lungs superimposed on mild emphysematous disease, but there are increased interstitial markings and haziness in the lower lung fields today. This could be due to interstitial pneumonitis or interstitial edema. The mediastinum is stable with mild aortic calcification and tortuosity. Clinical correlation and radiographic follow-up recommended. The mid and upper lung fields remain clear with COPD. There is no substantial pleural effusion. Thoracic spondylosis with no new osseous findings. IMPRESSION: 1. Increased interstitial markings and haziness in the lower lung fields today which could be due to interstitial pneumonitis or interstitial edema. 2. Mild cardiomegaly. The central vessels are slightly more prominent than previously but there is no flow cephalization. 3. COPD changes with no other acute findings. 4. Left chest dual lead pacing system. 5. Aortic atherosclerosis. Electronically Signed   By: Almira Bar M.D.   On: 11/12/2022 00:25    Microbiology: Results for orders placed or performed during the hospital encounter of 11/11/22  Resp panel by RT-PCR (RSV, Flu A&B, Covid) Anterior Nasal Swab     Status: None   Collection Time: 11/12/22 12:02 AM   Specimen: Anterior Nasal Swab  Result Value Ref Range Status   SARS Coronavirus 2 by RT PCR NEGATIVE NEGATIVE Final    Comment: (NOTE) SARS-CoV-2 target nucleic acids are NOT DETECTED.  The SARS-CoV-2 RNA is generally detectable in upper respiratory specimens during the acute phase of infection. The lowest concentration of SARS-CoV-2 viral copies this assay can detect is 138 copies/mL. A negative result does not preclude SARS-Cov-2 infection and should not be used as the sole basis for treatment or other patient management decisions. A negative result may occur with  improper specimen collection/handling, submission of specimen other than nasopharyngeal swab, presence of viral  mutation(s) within the areas targeted by this assay, and inadequate number of viral copies(<138 copies/mL). A negative result must be combined with clinical observations, patient history, and epidemiological information. The expected result is Negative.  Fact Sheet for Patients:  BloggerCourse.com  Fact Sheet for Healthcare Providers:  SeriousBroker.it  This test is no t yet approved or cleared by the [place] FDA and  has been authorized for detection and/or diagnosis of SARS-CoV-2 by FDA under an Emergency Use Authorization (EUA). This EUA will remain  in effect (meaning this test can be used) for the duration of the COVID-19 declaration under Section 564(b)(1) of the Act, 21 U.S.C.section 360bbb-3(b)(1), unless the authorization is terminated  or revoked sooner.       Influenza A by PCR NEGATIVE NEGATIVE Final   Influenza B by PCR NEGATIVE NEGATIVE Final    Comment: (NOTE) The Xpert Xpress SARS-CoV-2/FLU/RSV plus assay is intended as an aid in the diagnosis of influenza from Nasopharyngeal swab specimens and should not be used as a sole basis for treatment. Nasal washings and aspirates are unacceptable for Xpert Xpress SARS-CoV-2/FLU/RSV testing.  Fact Sheet for Patients: BloggerCourse.com  Fact Sheet for Healthcare Providers: SeriousBroker.it  This test is not yet approved or cleared by the [place] FDA and has been authorized for detection and/or diagnosis of SARS-CoV-2 by FDA under an Emergency Use Authorization (EUA). This EUA will remain in effect (meaning this test can be used) for the duration of the COVID-19 declaration under Section 564(b)(1) of the Act, 21 U.S.C. section 360bbb-3(b)(1), unless the authorization is terminated or revoked.     Resp Syncytial Virus by PCR NEGATIVE NEGATIVE Final    Comment: (NOTE) Fact Sheet for  Patients: BloggerCourse.com  Fact Sheet for Healthcare Providers: SeriousBroker.it  This test is not yet approved or cleared by the [place] and has been authorized for detection and/or diagnosis of SARS-CoV-2 by FDA under an Emergency Use Authorization (EUA). This EUA will remain in effect (meaning this test can be used) for the duration of the COVID-19 declaration under Section 564(b)(1) of the Act, 21 U.S.C. section 360bbb-3(b)(1), unless the authorization is terminated or revoked.  Performed at [facility], [address] [place], [place] [address]     Labs: CBC: Recent Labs  Lab 11/11/22 2340 11/12/22 0158 11/13/22 0002  WBC 8.5  --  10.9*  NEUTROABS 5.4  --   --   HGB 16.6 16.3 16.9  HCT 49.4 48.0 51.3  MCV 89.0  --  89.2  PLT 178  --  203   Basic Metabolic Panel: Recent Labs  Lab 11/11/22 2340 11/12/22 0158 11/13/22 0002  NA 136 135 132*  K 4.1 3.9 4.2  CL 100  --  95*  CO2 27  --  30  GLUCOSE 149*  --  99  BUN 21  --  28*  CREATININE 1.19  --  1.24  CALCIUM 8.8*  --  8.8*  MG  --   --  2.2   Discharge time spent: greater than 30 minutes.  Signed: Vassie Loll, MD [facility] 11/13/2022

## 2022-11-13 NOTE — Progress Notes (Signed)
Heart Failure Nurse Navigator Progress Note  PCP: Elie Confer, NP PCP-Cardiologist: Ladona Ridgel Admission Diagnosis: Numbness, acute congestive heart failure.  Admitted from: [facility] to [facility]   Presentation:   Mike Bean presented with shortness of breath, chest pain, left arm and leg numbness, increased with difficulty speaking ,and dizziness, cough x 2 weeks. BO 112/100, HR 72, BNP 61.8, EKG - V-paced, IV lasix given, CXR suggestive of interstitial edema. CT head and CTA head/ neck are unremarkable.   Patient was educated on the sign and symptoms of heart failure, daily weights, when to call his doctor or go to the ED, Diet/ fluid restrictions, taking all medications as prescribed nd attending all medical appointments. Patient verbalized his understanding of education, a HF TOC appointment was scheduled for 11/19/2022 @ 3 pm.   ECHO/ LVEF: 40-45%  Clinical Course:  Past Medical History:  Diagnosis Date   CHF (congestive heart failure) (HCC)    History of alcohol abuse    Hyperlipidemia    Nonischemic cardiomyopathy (HCC)    Presumed to be tachycardia mediated   Pacemaker 2012   Persistent atrial fibrillation (HCC) 2012   Snores      Social History   Socioeconomic History   Marital status: Divorced    Spouse name: Not on file   Number of children: 2   Years of education: Not on file   Highest education level: Associate degree: occupational, Scientist, product/process development, or vocational program  Occupational History   Occupation: Works in Investment banker, corporate: Geophysicist/field seismologist  Tobacco Use   Smoking status: Every Day    Current packs/day: 0.50    Types: Cigarettes   Smokeless tobacco: Never  Vaping Use   Vaping status: Never Used  Substance and Sexual Activity   Alcohol use: Not Currently    Alcohol/week: 21.0 standard drinks of alcohol    Types: 21 Standard drinks or equivalent per week    Comment: At least 3 beers a day   Drug use: Not Currently    Types: "Crack" cocaine     Comment: Hx of use   Sexual activity: Not on file  Other Topics Concern   Not on file  Social History Narrative   Lives in [place]   2 children, no grandchildren   Social Determinants of Health   Financial Resource Strain: Not on file  Food Insecurity: No Food Insecurity (11/12/2022)   Hunger Vital Sign    Worried About Running Out of Food in the Last Year: Never true    Ran Out of Food in the Last Year: Never true  Transportation Needs: No Transportation Needs (11/12/2022)   PRAPARE - Administrator, Civil Service (Medical): No    Lack of Transportation (Non-Medical): No  Physical Activity: Not on file  Stress: Not on file  Social Connections: Unknown (06/26/2022)   Received from [facility]   Social Network    Social Network: Not on file   Education Assessment and Provision:  Detailed education and instructions provided on heart failure disease management including the following:  Signs and symptoms of Heart Failure When to call the physician Importance of daily weights Low sodium diet Fluid restriction Medication management Anticipated future follow-up appointments  Patient education given on each of the above topics.  Patient acknowledges understanding via teach back method and acceptance of all instructions.  Education Materials:  "Living Better With Heart Failure" Booklet, HF zone tool, & Daily Weight Tracker Tool.  Patient has scale at  home: yes Patient has pill box at home: Yes    High Risk Criteria for Readmission and/or Poor Patient Outcomes: Heart failure hospital admissions (last 6 months): 0  No Show rate: 8% Difficult social situation: No, lives alone Demonstrates medication adherence: yes Primary Language: English Literacy level: Reading, writing, and comprehension  Barriers of Care:   Diet/ fluid restrictions (soda) Daily weights Smoking cessation/ 90 sober ( cocaine)   Considerations/Referrals:   Referral made to Heart Failure  Pharmacist Stewardship: Yes Referral made to Heart Failure CSW/NCM TOC: No Referral made to Heart & Vascular TOC clinic: Yes , 11/19/2022 @ 3 pm  Items for Follow-up on DC/TOC: Continued HF education Diet/ fluids restrictions/ daily weights Smoking cessation , also 90 days sober ( Cocaine)   Rhae Hammock, BSN, RN Heart Failure Teacher, adult education Only

## 2022-11-16 ENCOUNTER — Telehealth (HOSPITAL_COMMUNITY): Payer: Self-pay | Admitting: *Deleted

## 2022-11-16 NOTE — Telephone Encounter (Signed)
Called patient and left message reminding him of appointment in Heart and Vascular [facility] Monday 7/22/424. Asked patient to call us at [phone] if any questions or needs to reschedule.

## 2022-11-17 ENCOUNTER — Other Ambulatory Visit: Payer: Self-pay | Admitting: Internal Medicine

## 2022-11-19 ENCOUNTER — Encounter (HOSPITAL_COMMUNITY): Payer: PRIVATE HEALTH INSURANCE

## 2022-12-05 ENCOUNTER — Telehealth (HOSPITAL_COMMUNITY): Payer: Self-pay

## 2022-12-05 NOTE — Telephone Encounter (Signed)
Called to confirm Heart & Vascular Transitions of Care appointment. Patient reminded to bring all medications and pill box organizer with them. Confirmed patient has transportation. Gave directions, instructed to utilize valet parking.  Confirmed appointment prior to ending call.   

## 2022-12-06 ENCOUNTER — Encounter (HOSPITAL_COMMUNITY): Payer: PRIVATE HEALTH INSURANCE

## 2022-12-07 ENCOUNTER — Encounter (HOSPITAL_COMMUNITY): Payer: PRIVATE HEALTH INSURANCE

## 2022-12-22 ENCOUNTER — Other Ambulatory Visit: Payer: Self-pay | Admitting: Internal Medicine

## 2023-01-02 ENCOUNTER — Ambulatory Visit: Payer: PRIVATE HEALTH INSURANCE

## 2023-01-16 ENCOUNTER — Other Ambulatory Visit: Payer: Self-pay | Admitting: Internal Medicine

## 2023-01-16 MED ORDER — DAPAGLIFLOZIN PROPANEDIOL 10 MG PO TABS: 10.0000 mg | ORAL_TABLET | Freq: Every day | ORAL | 2 refills | Status: AC

## 2023-02-18 ENCOUNTER — Other Ambulatory Visit: Payer: Self-pay | Admitting: Internal Medicine

## 2023-03-04 ENCOUNTER — Other Ambulatory Visit: Payer: Self-pay | Admitting: Internal Medicine

## 2023-03-04 DIAGNOSIS — I4821 Permanent atrial fibrillation: Secondary | ICD-10-CM

## 2023-03-11 ENCOUNTER — Other Ambulatory Visit: Payer: Self-pay | Admitting: *Deleted

## 2023-03-11 DIAGNOSIS — I4821 Permanent atrial fibrillation: Secondary | ICD-10-CM

## 2023-03-11 MED ORDER — APIXABAN 5 MG PO TABS: 5.0000 mg | ORAL_TABLET | Freq: Two times a day (BID) | ORAL | 1 refills | Status: AC

## 2023-03-11 NOTE — Telephone Encounter (Signed)
Eliquis 5mg  refill request received. Patient is 62 years old, weight-83.4kg, Crea-1.24 on 11/13/22, Diagnosis-Afib, and last seen by Patrick North, NP on 08/23/22. Dose is appropriate based on dosing criteria. Will send in refill to requested pharmacy.

## 2023-04-03 ENCOUNTER — Ambulatory Visit: Payer: PRIVATE HEALTH INSURANCE

## 2023-07-03 ENCOUNTER — Ambulatory Visit: Payer: PRIVATE HEALTH INSURANCE

## 2023-07-23 ENCOUNTER — Telehealth: Payer: Self-pay | Admitting: Internal Medicine

## 2023-07-23 DIAGNOSIS — R600 Localized edema: Secondary | ICD-10-CM

## 2023-07-23 MED ORDER — FUROSEMIDE 40 MG PO TABS: 40.0000 mg | ORAL_TABLET | Freq: Every day | ORAL | 0 refills | Status: AC | PRN

## 2023-07-23 NOTE — Telephone Encounter (Signed)
*  STAT* If patient is at the pharmacy, call can be transferred to refill team.   1. Which medications need to be refilled? (please list name of each medication and dose if known)   furosemide (LASIX) 40 MG tablet (Expired)   2. Would you like to learn more about the convenience, safety, & potential cost savings by using the [facility]?   3. Are you open to using the [facility] (Type [facility]. ).  4. Which pharmacy/location (including street and city if local pharmacy) is medication to be sent to?  [facility] [id number] - [place], [place] - [address]   5. Do they need a 30 day or 90 day supply?   90 day  Patient stated he is completely out of this medication.  Patient has appointment scheduled on 3/28 at 1:30 pm with Canary Brim.

## 2023-07-25 NOTE — Progress Notes (Unsigned)
 Electrophysiology Office Note:   Date:  07/26/2023  ID:  Mike Bean, DOB 01-13-1961, MRN [id number]  Primary Cardiologist: None Primary Heart Failure: None Electrophysiologist: Lewayne Bunting, MD       History of Present Illness:   Mike Bean is a 63 y.o. male with h/o AF, ETOH cardiomyopathy, HFmrEF s/p BiV PPM, CVA/TIA, COPD,  seen today for routine electrophysiology followup.   Since last being seen in our clinic the patient reports he just finished rehab.  He reports he does not sleep.  He has not plugged his home monitor in for well over a year.  He states he moved "two years ago and just hasn't plugged it in".  He thinks he may have sleep apnea but had a negative study years ago.   He denies chest pain, palpitations, dyspnea, PND, orthopnea, nausea, vomiting, dizziness, syncope, edema, weight gain, or early satiety.   Review of systems complete and found to be negative unless listed in HPI.   EP Information / Studies Reviewed:    EKG is ordered today. Personal review as below.  EKG Interpretation Date/Time:  Friday July 26 2023 14:11:27 EDT Ventricular Rate:  72 PR Interval:    QRS Duration:  132 QT Interval:  458 QTC Calculation: 501 R Axis:   -85  Text Interpretation: Ventricular-paced rhythm with occasional Premature ventricular complexes Biventricular pacemaker detected Confirmed by Canary Brim ([id number]) on 07/26/2023 5:10:06 PM  AF/AFL  PPM Interrogation-  reviewed in detail today,  See PACEART report.  Device History: Abbott BiV CRT-P implanted 06/27/10 for  HFrEF , Mixed lead system Generator Change: 02/08/2020  Studies:  ECHO 10/2022 > LVEF 40-45%, LA mildly dilated, RA mild-mod dilated, trivial MV regurgitation  Arrhythmia / AAD AF > s/p AV Node ablation 2012   Risk Assessment/Calculations:    CHA2DS2-VASc Score = 3   This indicates a 3.2% annual risk of stroke. The patient's score is based upon: CHF History: 1 HTN History: 0 Diabetes History:  0 Stroke History: 2 Vascular Disease History: 0 Age Score: 0 Gender Score: 0             Physical Exam:   VS:  BP 138/80   Pulse 72   Ht 5\' 10"  (1.778 m)   Wt 187 lb 3.2 oz (84.9 kg)   SpO2 95%   BMI 26.86 kg/m    Wt Readings from Last 3 Encounters:  07/26/23 187 lb 3.2 oz (84.9 kg)  11/13/22 183 lb 13.8 oz (83.4 kg)  08/23/22 178 lb 6 oz (80.9 kg)     GEN: chronically ill appearing adult male in no acute distress, wearing sunglasses  PSY: tangential in conversation, pressured speech, difficulty maintaining focus in conversation NECK: No JVD; No carotid bruits CARDIAC: Regular rate and rhythm (VP), no murmurs, rubs, gallops RESPIRATORY:  Clear to auscultation without rales, wheezing or rhonchi  ABDOMEN: Soft, non-tender, non-distended EXTREMITIES:  BLE 2+ pitting edema; No deformity   ASSESSMENT AND PLAN:    Chronic Systolic Dysfuncton  s/p Abbott BiV PPM  -89% BiV pacing with 11% PVC's -EKG with QRS 132 ms  -Normal PPM function -volume up on exam > pt just prescribed lasix by primary, discussed taking as may improve his BiV pacing percentage -See Pace Art report -No changes today  PVC's -11% on current interrogation  -follow up at next visit, if fluid removed and continues to have PVC's, may need to add beta blocker  Permanent Atrial Fibrillation  CHA2DS2-VASc 3 -OAC for stroke prophylaxis   -  100% AF on device / perm VVIR  Secondary Hypercoagulable State  -continue Eliquis 5mg  BID, dose reviewed and appropriate by age/wt   ETOH Abuse  -pt just completed rehab  -encouraged sobriety   Disposition:   Follow up with Dr. Ladona Ridgel in 6 months  Signed, Canary Brim, NP-C, AGACNP-BC [facility] - Electrophysiology  07/26/2023, 5:27 PM

## 2023-07-26 ENCOUNTER — Ambulatory Visit: Payer: PRIVATE HEALTH INSURANCE | Attending: Pulmonary Disease | Admitting: Pulmonary Disease

## 2023-07-26 ENCOUNTER — Encounter: Payer: Self-pay | Admitting: Pulmonary Disease

## 2023-07-26 VITALS — BP 138/80 | HR 72 | Ht 70.0 in | Wt 187.2 lb

## 2023-07-26 DIAGNOSIS — I4821 Permanent atrial fibrillation: Secondary | ICD-10-CM

## 2023-07-26 DIAGNOSIS — Z950 Presence of cardiac pacemaker: Secondary | ICD-10-CM

## 2023-07-26 DIAGNOSIS — I5022 Chronic systolic (congestive) heart failure: Secondary | ICD-10-CM | POA: Diagnosis not present

## 2023-07-26 DIAGNOSIS — I428 Other cardiomyopathies: Secondary | ICD-10-CM

## 2023-07-26 LAB — CUP PACEART INCLINIC DEVICE CHECK
Date Time Interrogation Session: 20250328171120
Implantable Lead Connection Status: 753985
Implantable Lead Connection Status: 753985
Implantable Lead Connection Status: 753985
Implantable Lead Implant Date: 20120228
Implantable Lead Implant Date: 20120228
Implantable Lead Implant Date: 20120228
Implantable Lead Location: 753858
Implantable Lead Location: 753859
Implantable Lead Location: 753860
Implantable Lead Model: 4196
Implantable Pulse Generator Implant Date: 20211011
Pulse Gen Model: 3222
Pulse Gen Serial Number: 3859402

## 2023-07-26 NOTE — Patient Instructions (Signed)
 Medication Instructions:  Your physician recommends that you continue on your current medications as directed. Please refer to the Current Medication list given to you today.  *If you need a refill on your cardiac medications before your next appointment, please call your pharmacy*  Lab Work: None ordered If you have labs (blood work) drawn today and your tests are completely normal, you will receive your results only by: MyChart Message (if you have MyChart) OR A paper copy in the mail If you have any lab test that is abnormal or we need to change your treatment, we will call you to review the results.  Follow-Up: At [facility], you and your health needs are our priority.  As part of our continuing mission to provide you with exceptional heart care, our providers are all part of one team.  This team includes your primary Cardiologist (physician) and Advanced Practice Providers or APPs (Physician Assistants and Nurse Practitioners) who all work together to provide you with the care you need, when you need it.  Your next appointment:   6 month(s)  Provider:   Lewayne Bunting, MD or Canary Brim, NP         1st Floor: - Lobby - Registration  - Pharmacy  - Lab - Cafe  2nd Floor: - PV Lab - Diagnostic Testing (echo, CT, nuclear med)  3rd Floor: - Vacant  4th Floor: - TCTS (cardiothoracic surgery) - AFib Clinic - Structural Heart Clinic - Vascular Surgery  - Vascular Ultrasound  5th Floor: - HeartCare Cardiology (general and EP) - Clinical Pharmacy for coumadin, hypertension, lipid, weight-loss medications, and med management appointments    Valet parking services will be available as well.

## 2024-01-06 ENCOUNTER — Other Ambulatory Visit: Payer: Self-pay

## 2024-01-06 ENCOUNTER — Emergency Department (HOSPITAL_BASED_OUTPATIENT_CLINIC_OR_DEPARTMENT_OTHER)
Admission: EM | Admit: 2024-01-06 | Discharge: 2024-01-06 | Disposition: A | Discharge status: Home / Self Care | Payer: PRIVATE HEALTH INSURANCE | Attending: Emergency Medicine | Admitting: Emergency Medicine

## 2024-01-06 ENCOUNTER — Emergency Department (HOSPITAL_BASED_OUTPATIENT_CLINIC_OR_DEPARTMENT_OTHER): Payer: PRIVATE HEALTH INSURANCE

## 2024-01-06 DIAGNOSIS — R0602 Shortness of breath: Secondary | ICD-10-CM | POA: Diagnosis present

## 2024-01-06 DIAGNOSIS — Z7901 Long term (current) use of anticoagulants: Secondary | ICD-10-CM | POA: Diagnosis not present

## 2024-01-06 DIAGNOSIS — R059 Cough, unspecified: Secondary | ICD-10-CM | POA: Diagnosis not present

## 2024-01-06 LAB — CBC WITH DIFFERENTIAL/PLATELET
Abs Immature Granulocytes: 0.07 K/uL (ref 0.00–0.07)
Basophils Absolute: 0.1 K/uL (ref 0.0–0.1)
Basophils Relative: 1 %
Eosinophils Absolute: 0.1 K/uL (ref 0.0–0.5)
Eosinophils Relative: 1 %
HCT: 46 % (ref 39.0–52.0)
Hemoglobin: 15.3 g/dL (ref 13.0–17.0)
Immature Granulocytes: 1 %
Lymphocytes Relative: 11 %
Lymphs Abs: 1.5 K/uL (ref 0.7–4.0)
MCH: 29.5 pg (ref 26.0–34.0)
MCHC: 33.3 g/dL (ref 30.0–36.0)
MCV: 88.8 fL (ref 80.0–100.0)
Monocytes Absolute: 1.8 K/uL — ABNORMAL HIGH (ref 0.1–1.0)
Monocytes Relative: 13 %
Neutro Abs: 10.1 K/uL — ABNORMAL HIGH (ref 1.7–7.7)
Neutrophils Relative %: 73 %
Platelets: 158 K/uL (ref 150–400)
RBC: 5.18 MIL/uL (ref 4.22–5.81)
RDW: 13.4 % (ref 11.5–15.5)
WBC: 13.6 K/uL — ABNORMAL HIGH (ref 4.0–10.5)
nRBC: 0 % (ref 0.0–0.2)

## 2024-01-06 LAB — BASIC METABOLIC PANEL WITH GFR
Anion gap: 13 (ref 5–15)
BUN: 17 mg/dL (ref 8–23)
CO2: 25 mmol/L (ref 22–32)
Calcium: 8.7 mg/dL — ABNORMAL LOW (ref 8.9–10.3)
Chloride: 98 mmol/L (ref 98–111)
Creatinine, Ser: 1.23 mg/dL (ref 0.61–1.24)
GFR, Estimated: >60 mL/min (ref >60)
Glucose, Bld: 97 mg/dL (ref 70–99)
Potassium: 3.7 mmol/L (ref 3.5–5.1)
Sodium: 136 mmol/L (ref 135–145)

## 2024-01-06 LAB — RESP PANEL BY RT-PCR (RSV, FLU A&B, COVID)  RVPGX2
Influenza A by PCR: NEGATIVE
Influenza B by PCR: NEGATIVE
Resp Syncytial Virus by PCR: NEGATIVE
SARS Coronavirus 2 by RT PCR: NEGATIVE

## 2024-01-06 LAB — PRO BRAIN NATRIURETIC PEPTIDE: Pro Brain Natriuretic Peptide: 905 pg/mL — ABNORMAL HIGH (ref <300.0)

## 2024-01-06 MED ORDER — IPRATROPIUM-ALBUTEROL 0.5-2.5 (3) MG/3ML IN SOLN
RESPIRATORY_TRACT | Status: AC
  Administered 2024-01-06: 3 mL
  Filled 2024-01-06: qty 3

## 2024-01-06 MED ORDER — PREDNISONE 20 MG PO TABS: ORAL_TABLET | ORAL | 0 refills | Status: AC

## 2024-01-06 MED ORDER — IPRATROPIUM-ALBUTEROL 0.5-2.5 (3) MG/3ML IN SOLN
3.0000 mL | Freq: Once | RESPIRATORY_TRACT | Status: AC
  Administered 2024-01-06: 3 mL via RESPIRATORY_TRACT

## 2024-01-06 MED ORDER — FUROSEMIDE 10 MG/ML IJ SOLN
40.0000 mg | Freq: Once | INTRAMUSCULAR | Status: AC
  Administered 2024-01-06: 40 mg via INTRAVENOUS
  Filled 2024-01-06: qty 4

## 2024-01-06 MED ORDER — IPRATROPIUM-ALBUTEROL 0.5-2.5 (3) MG/3ML IN SOLN
3.0000 mL | RESPIRATORY_TRACT | Status: AC
Start: 2024-01-06 — End: 2024-01-06
  Filled 2024-01-06: qty 3

## 2024-01-06 MED ORDER — DOXYCYCLINE HYCLATE 100 MG PO CAPS: 100.0000 mg | ORAL_CAPSULE | Freq: Two times a day (BID) | ORAL | 0 refills | Status: AC

## 2024-01-06 MED ORDER — METHYLPREDNISOLONE SODIUM SUCC 125 MG IJ SOLR
125.0000 mg | Freq: Once | INTRAMUSCULAR | Status: AC
  Administered 2024-01-06: 125 mg via INTRAVENOUS
  Filled 2024-01-06: qty 2

## 2024-01-06 NOTE — ED Provider Notes (Signed)
 [place] EMERGENCY DEPARTMENT AT [facility] Provider Note   CSN: [id number] Arrival date & time: 01/06/24  1303     Patient presents with: Shortness of Breath   Mike Bean is a 63 y.o. male.  {Add pertinent medical, surgical, social history, OB history to HPI:32947} 63 yo M with a chief complaints of cough congestion going on for about 2 or 3 days.  He said multiple people he knows are sick with something similar.  He because of the carotid.  [place] initially it was green sputum and then had gone to complete white.  He thinks maybe it is the illness combined with his COPD and perhaps also his CHF that is making him feel bad.   Shortness of Breath      Prior to Admission medications   Medication Sig Start Date End Date Taking? Authorizing Provider  albuterol  (PROVENTIL ) (2.5 MG/3ML) 0.083% nebulizer solution Take 3 mLs (2.5 mg total) by nebulization every 6 (six) hours as needed for wheezing or shortness of breath. 06/09/19   Mike Charleston, MD  albuterol  (VENTOLIN  HFA) [phone] Base) MCG/ACT inhaler Inhale 2 puffs into the lungs every 6 (six) hours as needed for wheezing or shortness of breath.    [provider]  apixaban  (ELIQUIS ) 5 MG TABS tablet Take 1 tablet (5 mg total) by mouth 2 (two) times daily. 03/11/23   [name], MD  Ascorbic Acid (VITAMIN C) 1000 MG tablet Take 1,000 mg by mouth daily.    [provider]  atorvastatin  (LIPITOR ) 80 MG tablet Take 1 tablet (80 mg total) by mouth daily. 01/12/22   Bean, Mike CHRISTELLA, MD  benzonatate  (TESSALON ) 200 MG capsule Take 1 capsule (200 mg total) by mouth 3 (three) times daily as needed for cough. 11/13/22   Mike Fines, MD  cetirizine (ZYRTEC) 10 MG chewable tablet Chew 10 mg by mouth daily.    [provider]  cyanocobalamin  (VITAMIN B12) 500 MCG tablet Take 1,000 mcg by mouth daily.    [provider]  dapagliflozin  propanediol (FARXIGA ) 10 MG TABS tablet Take 1 tablet (10 mg  total) by mouth daily before breakfast. 01/16/23   [name], MD  furosemide  (LASIX ) 40 MG tablet Take 1 tablet (40 mg total) by mouth daily as needed for fluid or edema (SOB.). 07/23/23   [name], MD  meclizine  (ANTIVERT ) 25 MG tablet Take 1 tablet (25 mg total) by mouth every 8 (eight) hours as needed for dizziness. 11/13/22   Mike Fines, MD  nicotine  (NICODERM CQ  - DOSED IN MG/24 HOURS) 21 mg/24hr patch Place 1 patch (21 mg total) onto the skin daily. 11/14/22   Mike Fines, MD  Omega-3 Fatty Acids (FISH OIL CONCENTRATE) 300 MG CAPS Take 300 mg by mouth daily.    [provider]  sacubitril -valsartan  (ENTRESTO ) 49-51 MG Take 1 tablet by mouth 2 (two) times daily. Pt needs to schedule appt with provider soon. 02/18/23   [name], MD    Allergies: Patient has no known allergies.    Review of Systems  Respiratory:  Positive for shortness of breath.     Updated Vital Signs BP 115/63   Pulse 75   Temp 98.7 F (37.1 C) (Oral)   Resp (!) 22   SpO2 95%   Physical Exam Vitals and nursing note reviewed.  Constitutional:      Appearance: He is well-developed.  HENT:     Head: Normocephalic and atraumatic.  Eyes:  Pupils: Pupils are equal, round, and reactive to light.  Neck:     Vascular: No JVD.  Cardiovascular:     Rate and Rhythm: Normal rate and regular rhythm.     Heart sounds: No murmur heard.    No friction rub. No gallop.  Pulmonary:     Effort: No respiratory distress.     Breath sounds: No wheezing.     Comments: Coarse breath sounds in all fields with prolonged expiratory effort without obvious wheezes Abdominal:     General: There is no distension.     Tenderness: There is no abdominal tenderness. There is no guarding or rebound.  Musculoskeletal:        General: Normal range of motion.     Cervical back: Normal range of motion and neck supple.     Right lower leg: Edema present.     Left lower leg: Edema present.     Comments:  Bilateral lower extremity edema up to the knees.  Some chronic skin changes.  Pulse motor and sensation intact.  Skin:    Coloration: Skin is not pale.     Findings: No rash.  Neurological:     Mental Status: He is alert and oriented to person, place, and time.  Psychiatric:        Behavior: Behavior normal.     (all labs ordered are listed, but only abnormal results are displayed) Labs Reviewed  RESP PANEL BY RT-PCR (RSV, FLU A&B, COVID)  [id number]  CBC WITH DIFFERENTIAL/PLATELET  BASIC METABOLIC PANEL WITH GFR  PRO BRAIN NATRIURETIC PEPTIDE    EKG: None  Radiology: No results found.  {Document cardiac monitor, telemetry assessment procedure when appropriate:32947} Procedures   Medications Ordered in the ED  ipratropium-albuterol  (DUONEB) 0.5-2.5 (3) MG/3ML nebulizer solution (has no administration in time range)  ipratropium-albuterol  (DUONEB) 0.5-2.5 (3) MG/3ML nebulizer solution 3 mL (has no administration in time range)  methylPREDNISolone  sodium succinate (SOLU-MEDROL ) 125 mg/2 mL injection 125 mg (has no administration in time range)  ipratropium-albuterol  (DUONEB) 0.5-2.5 (3) MG/3ML nebulizer solution 3 mL (3 mLs Nebulization Given 01/06/24 1354)      {Click here for ABCD2, HEART and other calculators REFRESH Note before signing:1}                              Medical Decision Making Amount and/or Complexity of Data Reviewed Labs: ordered. Radiology: ordered.  Risk Prescription drug management.   63 yo M with a chief complaints of difficulty breathing cough congestion going on for about 3 to 4 days.  He has trouble telling if this is his COPD or CHF or if it is a viral syndrome.  He tells me he thinks maybe it is a combination of all 3.  Will obtain a chest x-ray blood work beta agonist therapy steroids reassess.  {Document critical care time when appropriate  Document review of labs and clinical decision tools ie CHADS2VASC2, etc  Document your independent  review of radiology images and any outside records  Document your discussion with family members, caretakers and with consultants  Document social determinants of health affecting pt's care  Document your decision making why or why not admission, treatments were needed:32947:::1}   Final diagnoses:  None    ED Discharge Orders     None

## 2024-01-06 NOTE — Discharge Instructions (Signed)
Use your inhaler every 4 hours(6 puffs) while awake, return for sudden worsening shortness of breath, or if you need to use your inhaler more often.  ° °

## 2024-01-06 NOTE — ED Triage Notes (Signed)
 Patient states hx of CHF and COPD. States shortness of breath and dizziness for the past several days.

## 2024-02-10 ENCOUNTER — Emergency Department (HOSPITAL_BASED_OUTPATIENT_CLINIC_OR_DEPARTMENT_OTHER)
Admission: EM | Admit: 2024-02-10 | Discharge: 2024-02-10 | Disposition: A | Discharge status: Home / Self Care | Payer: PRIVATE HEALTH INSURANCE | Attending: Emergency Medicine | Admitting: Emergency Medicine

## 2024-02-10 ENCOUNTER — Other Ambulatory Visit: Payer: Self-pay

## 2024-02-10 ENCOUNTER — Emergency Department (HOSPITAL_BASED_OUTPATIENT_CLINIC_OR_DEPARTMENT_OTHER): Payer: PRIVATE HEALTH INSURANCE | Admitting: Radiology

## 2024-02-10 ENCOUNTER — Emergency Department (HOSPITAL_BASED_OUTPATIENT_CLINIC_OR_DEPARTMENT_OTHER): Payer: PRIVATE HEALTH INSURANCE

## 2024-02-10 DIAGNOSIS — Z7901 Long term (current) use of anticoagulants: Secondary | ICD-10-CM | POA: Diagnosis not present

## 2024-02-10 DIAGNOSIS — R234 Changes in skin texture: Secondary | ICD-10-CM | POA: Diagnosis not present

## 2024-02-10 DIAGNOSIS — Y92410 Unspecified street and highway as the place of occurrence of the external cause: Secondary | ICD-10-CM | POA: Insufficient documentation

## 2024-02-10 DIAGNOSIS — Z720 Tobacco use: Secondary | ICD-10-CM | POA: Insufficient documentation

## 2024-02-10 DIAGNOSIS — R519 Headache, unspecified: Secondary | ICD-10-CM | POA: Insufficient documentation

## 2024-02-10 DIAGNOSIS — M542 Cervicalgia: Secondary | ICD-10-CM | POA: Diagnosis present
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
## 2024-03-02 ENCOUNTER — Telehealth: Payer: Self-pay | Admitting: Internal Medicine

## 2024-03-02 NOTE — Telephone Encounter (Signed)
 Signe, was calling from [employer] at [facility]. He called to go over patient's medication list, and wanting to know if patient needs to be seen by cardiologist while his is in rehab.

## 2024-03-02 NOTE — Telephone Encounter (Signed)
 Spoke to [employer] at [employer] at [facility] (rehab in [place] ) . Verbally spoke with pt, confirmed identifiers with pt -- permission given to speak with [place] psychiatrist at rehab facility) about any of his healthcare needs.   Mike Bean reports pt will be out there 4-5 weeks, maybe longer if he stays for post rehab follow up there in [place] .  Confirmed pt is taking Entresto , Lasix  PRN, Farxiga  & Eliquis .  They were able to get pt a month supply of these.  They will call the office back if they need further refill/s sent to [place]  while pt is there.  Forwarding to Dr. Waddell for approval to refill these medications temporarily while in [place] , if needed (rehab will call back if need refill/s sent there) .  Forwarding note to EP scheduler to follow up with pt in several months to see if returned from rehab and get him scheduled for routine follow up.

## 2024-03-04 NOTE — Telephone Encounter (Signed)
 Signe calling back to ask pts medical records be sent to [employer] at [facility] fax #: [phone]

## 2024-03-06 NOTE — Telephone Encounter (Signed)
 Returned call to RN Signe Shed w/ [facility] in [place] . States he is attempting to schedule a F/U appointment w/ Cardiologist in [place]  and wanted to clarify device information. Signe Shed RN had patient records and device information prior to call and wanted to clarify PPM manufacturer in order to have the correct industry representative present for F/U appointment. 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   Informed to call device clinic back for any further questions or concerns.

## 2024-03-06 NOTE — Telephone Encounter (Signed)
 Conner is calling to speak with a nurse about the type of device the pt has please advise   [place] Shed  [phone]

## 2024-03-06 NOTE — Telephone Encounter (Signed)
 Spoke to patient's RN Signe Shed with [facility] in [place] .Stated patient presently at their facility.He does not know when he will be discharged.He wanted to know what kind of pacemaker he has and name of company.He will be getting him a cardiologist there.Advised I will send message to Dr.Taylor's RN.

## 2024-03-12 ENCOUNTER — Telehealth: Payer: Self-pay

## 2024-03-12 NOTE — Telephone Encounter (Signed)
 Patient was due for 6 mth follow up with [facility] in September - past due.  Please set patient up and add the following to appt notes: Discuss Remote Monitoring, patient has not been transmitting.  Thanks.

## 2024-03-13 NOTE — Telephone Encounter (Signed)
 Call x1; attempted to call patient at his primary contact, got the busy signal. Called his work, left my direct call back number and to c/b to schedule a f/u appointment.

## 2024-03-13 NOTE — Telephone Encounter (Signed)
 Noted, thank you. I didn't see that in the notes prior to routing.

## 2024-05-01 ENCOUNTER — Ambulatory Visit: Payer: PRIVATE HEALTH INSURANCE

## 2024-07-31 ENCOUNTER — Ambulatory Visit: Payer: PRIVATE HEALTH INSURANCE
